# Patient Record
Sex: Male | Born: 1935 | Race: White | Hispanic: No | Marital: Married | Smoking: Never smoker
Health system: Southern US, Community
[De-identification: ages and names within clinical notes are randomized; demographics above are authoritative.]

## PROBLEM LIST (undated history)

## (undated) DIAGNOSIS — B0229 Other postherpetic nervous system involvement: Secondary | ICD-10-CM

## (undated) DIAGNOSIS — Z8619 Personal history of other infectious and parasitic diseases: Secondary | ICD-10-CM

## (undated) DIAGNOSIS — G510 Bell's palsy: Secondary | ICD-10-CM

## (undated) DIAGNOSIS — R251 Tremor, unspecified: Secondary | ICD-10-CM

## (undated) DIAGNOSIS — G4733 Obstructive sleep apnea (adult) (pediatric): Secondary | ICD-10-CM

## (undated) DIAGNOSIS — I350 Nonrheumatic aortic (valve) stenosis: Secondary | ICD-10-CM

## (undated) DIAGNOSIS — C801 Malignant (primary) neoplasm, unspecified: Secondary | ICD-10-CM

## (undated) DIAGNOSIS — J189 Pneumonia, unspecified organism: Secondary | ICD-10-CM

## (undated) DIAGNOSIS — R011 Cardiac murmur, unspecified: Secondary | ICD-10-CM

## (undated) DIAGNOSIS — E785 Hyperlipidemia, unspecified: Secondary | ICD-10-CM

## (undated) DIAGNOSIS — I10 Essential (primary) hypertension: Secondary | ICD-10-CM

## (undated) DIAGNOSIS — M1990 Unspecified osteoarthritis, unspecified site: Secondary | ICD-10-CM

## (undated) HISTORY — DX: Malignant (primary) neoplasm, unspecified: C80.1

## (undated) HISTORY — DX: Cardiac murmur, unspecified: R01.1

## (undated) HISTORY — DX: Obstructive sleep apnea (adult) (pediatric): G47.33

## (undated) HISTORY — DX: Hyperlipidemia, unspecified: E78.5

## (undated) HISTORY — PX: HERNIA REPAIR: SHX51

## (undated) HISTORY — DX: Bell's palsy: G51.0

## (undated) HISTORY — DX: Essential (primary) hypertension: I10

## (undated) HISTORY — PX: PROSTATE SURGERY: SHX751

## (undated) HISTORY — DX: Nonrheumatic aortic (valve) stenosis: I35.0

## (undated) HISTORY — PX: COLONOSCOPY: SHX174

## (undated) HISTORY — PX: TONSILLECTOMY: SUR1361

## (undated) HISTORY — DX: Tremor, unspecified: R25.1

---

## 2000-07-07 ENCOUNTER — Other Ambulatory Visit: Admission: RE | Admit: 2000-07-07 | Discharge: 2000-07-07 | Payer: Self-pay | Admitting: Urology

## 2000-07-07 ENCOUNTER — Encounter (INDEPENDENT_AMBULATORY_CARE_PROVIDER_SITE_OTHER): Payer: Self-pay

## 2000-08-12 ENCOUNTER — Encounter: Payer: Self-pay | Admitting: Urology

## 2000-08-18 ENCOUNTER — Inpatient Hospital Stay (HOSPITAL_COMMUNITY): Admission: RE | Admit: 2000-08-18 | Discharge: 2000-08-22 | Payer: Self-pay | Admitting: Urology

## 2000-08-18 ENCOUNTER — Encounter (INDEPENDENT_AMBULATORY_CARE_PROVIDER_SITE_OTHER): Payer: Self-pay | Admitting: Specialist

## 2004-07-07 ENCOUNTER — Emergency Department (HOSPITAL_COMMUNITY): Admission: EM | Admit: 2004-07-07 | Discharge: 2004-07-08 | Payer: Self-pay | Admitting: Emergency Medicine

## 2005-02-06 ENCOUNTER — Encounter (INDEPENDENT_AMBULATORY_CARE_PROVIDER_SITE_OTHER): Payer: Self-pay | Admitting: Specialist

## 2005-02-06 ENCOUNTER — Ambulatory Visit (HOSPITAL_COMMUNITY): Admission: RE | Admit: 2005-02-06 | Discharge: 2005-02-06 | Payer: Self-pay | Admitting: *Deleted

## 2006-09-27 ENCOUNTER — Ambulatory Visit: Payer: Self-pay | Admitting: Vascular Surgery

## 2006-11-05 ENCOUNTER — Ambulatory Visit: Payer: Self-pay | Admitting: Vascular Surgery

## 2006-12-06 ENCOUNTER — Ambulatory Visit: Payer: Self-pay | Admitting: Vascular Surgery

## 2006-12-28 ENCOUNTER — Ambulatory Visit: Payer: Self-pay | Admitting: Vascular Surgery

## 2007-01-03 ENCOUNTER — Ambulatory Visit: Payer: Self-pay | Admitting: Vascular Surgery

## 2007-01-18 ENCOUNTER — Ambulatory Visit: Payer: Self-pay | Admitting: Vascular Surgery

## 2007-01-25 ENCOUNTER — Ambulatory Visit: Payer: Self-pay | Admitting: Vascular Surgery

## 2007-04-29 ENCOUNTER — Ambulatory Visit: Payer: Self-pay | Admitting: Vascular Surgery

## 2007-05-25 ENCOUNTER — Ambulatory Visit: Payer: Self-pay | Admitting: Vascular Surgery

## 2007-06-15 ENCOUNTER — Ambulatory Visit: Payer: Self-pay | Admitting: Vascular Surgery

## 2007-07-20 ENCOUNTER — Ambulatory Visit: Payer: Self-pay | Admitting: Vascular Surgery

## 2007-07-28 ENCOUNTER — Ambulatory Visit: Payer: Self-pay | Admitting: Vascular Surgery

## 2007-08-25 ENCOUNTER — Ambulatory Visit: Payer: Self-pay | Admitting: Vascular Surgery

## 2007-11-03 ENCOUNTER — Ambulatory Visit: Payer: Self-pay | Admitting: Vascular Surgery

## 2010-11-11 NOTE — Assessment & Plan Note (Signed)
OFFICE VISIT   DAVINE, COBA  DOB:  Nov 02, 1931                                       04/29/2007  QIHKV#:[id number]   Patient presents today for continued followup of his bilateral lower  extremity venous hypertension.  He has undergone successful staged  bilateral laser ablation of the greater saphenous vein.  He reports this  is markedly improved the heavy sensation he has in his thighs .  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  He continues to report pain with standing associated with  this.  He reports this is progressive throughout the day and interferes  with his ability to do [facility] and exercise.   PHYSICAL EXAMINATION:  On physical exam, he has marked spider vein  telangiectasias over both thighs. He does have  areas of tributary  varicosities with some tenderness over calves and pretibial areas  bilaterally.  There is no evidence of superficial thrombophlebitis.   He has been incredibly compliant with 30-40 mmHg stockings, thigh high  for greater than 3 months.  He reports pain associated with the  tributary varicosities in calves and pretibial areas bilaterally. I  discussed the option of stab phlebectomy of his tributary varicosities  and expect complete relief of the pain associated with the tributaries.  He wishes to proceed as soon as we have assured his insurance coverage.   Larina Earthly, M.D.  Electronically Signed   TFE/MEDQ  D:  04/29/2007  T:  05/02/2007  Job:  [id number]

## 2010-11-11 NOTE — Assessment & Plan Note (Signed)
OFFICE VISIT   Stephen Nunez, Stephen Nunez  DOB:  27-Sep-1931                                       12/06/2006  [id number]#:[id number]   Here today for continued followup of his severe bilateral venous  hypertension.  He has had known incompetence of the saphenous vein and I  had recommended laser ablation, stab phlebectomy, and staged procedures  bilaterally.  His insurance carrier had suggested that he try 30-40 mm  compression.  He has actually been very compliant with this, despite the  difficulty in managing this high degree of compression.  He reports that  this does not afford him any improvement.  He has noted slightly less  bulging in the tributary varicosities, but has not had any relief in the  severe discomfort he has at the end of the day with prolonged standing.  I have recommended that we proceed with staged bilateral laser ablation  and stab phlebectomy.  He understands the procedure.  He feels that his  left leg is slightly worse than his right and wishes to proceed with his  left leg first.  We will proceed with this as soon as he has clearance  from his insurance carrier for coverage.   Larina Earthly, M.D.  Electronically Signed   TFE/MEDQ  D:  12/06/2006  T:  12/07/2006  Job:  [id number]   cc:   C. Duane Lope, M.D.

## 2010-11-11 NOTE — Assessment & Plan Note (Signed)
OFFICE VISIT   Stephen Nunez, Stephen Nunez  DOB:  August 28, 1931                                       01/03/2007  [id number]#:[id number]   The patient presents today for followup of his laser ablation of his  left greater saphenous vein on December 28, 2006. He did extremely well with  the procedure and has had minimal discomfort at the laser ablation site.  He has the usual amount of erythema over this area from the inflammatory  response from the ablation of the saphenous vein. He does have  decompression of the veins in his calf below this. He underwent hand  held duplex today and this reveals excellent result with occlusion of  his left great saphenous vein and patency of the common femoral vein at  the saphenous femoral junction.   I am quite pleased with his initial result as is the patient. He will  continue his ibuprofen p.r.n. I plan to see him again in 3 weeks for  staged right laser ablation.   Larina Earthly, M.D.  Electronically Signed   TFE/MEDQ  D:  01/03/2007  T:  01/03/2007  Job:  [id number]   cc:   C. Duane Lope, M.D.

## 2010-11-11 NOTE — Assessment & Plan Note (Signed)
OFFICE VISIT   Stephen Nunez, Stephen Nunez  DOB:  Mar 30, 1932                                       07/20/2007  ZOXWR#:[id number]   The patient presents today for final followup status post bilateral  saphenous vein ablation, bilateral stab phlebectomies.  He is quite  pleased with his overall results.  He has markedly diminished pain and  has markedly improved ability for prolonged standing.  He does have  multiple spider vein telangiectasia, but does not have any evidence of  recurrent varicose veins.  He will continue wearing his graduated  compression stockings, and will see again on an as needed basis.   Larina Earthly, M.D.  Electronically Signed   TFE/MEDQ  D:  07/20/2007  T:  07/21/2007  Job:  [id number]

## 2010-11-11 NOTE — Assessment & Plan Note (Signed)
OFFICE VISIT   Stephen Nunez, Stephen Nunez  DOB:  February 28, 1932                                       01/25/2007  GNFAO#:[id number]   Stephen Nunez underwent laser ablation of right greater saphenous vein by  Dr. Arbie Cookey on July 22 for symptomatic venous insufficiency of the right  leg.  He denies any discomfort in the right leg, tenderness, swelling or  otherwise.  He has been wearing elastic compression stockings and took  ibuprofen for seven days.  He also denies any distal edema.  His leg was  examined.  There is no evidence of any tenderness along the greater  saphenous vein, and no distal edema was noted.  Venous Duplex exam was  performed by me in the office today and reveals total occlusion of the  right greater saphenous vein from the saphenofemoral junction to the  distal thigh.  The vein is noncompressible, and the deep venous appears  normal with good flow.  He is reassured regarding these findings.  Return in three months for final followup with Dr. Arbie Cookey.   Quita Skye Hart [place], M.D.  Electronically Signed   JDL/MEDQ  D:  01/25/2007  T:  01/26/2007  Job:  [id number]

## 2010-11-14 NOTE — Op Note (Signed)
[name], Stephen Nunez               ACCOUNT NO.:  [id number]   MEDICAL RECORD NO.:  [id number]          PATIENT TYPE:  AMB   LOCATION:  DAY                          [facility]   PHYSICIAN:  Vikki Ports, MDDATE OF BIRTH:  1931-07-05   DATE OF PROCEDURE:  02/06/2005  DATE OF DISCHARGE:                                 OPERATIVE REPORT   PREOPERATIVE DIAGNOSIS:  Left inguinal hernia.   POSTOPERATIVE DIAGNOSIS:  Left inguinal hernia.   PROCEDURE:  Left inguinal hernia repair with mesh using the Prolene hernia  system.   ANESTHESIA:  General.   DESCRIPTION OF PROCEDURE:  The patient was taken to the operating room and  placed in a supine position and after adequate general anesthesia was  induced using laryngeal mask, the left groin was prepped and draped in the  normal sterile fashion. Using an oblique incision extending from the pubic  tubercle out towards the anterior-superior iliac spine and dissected down  through the subcutaneous tissue using Bovie electrocautery. The external  oblique fascia was identified and opened along its fibers down through the  external ring. The ilioinguinal nerve was identified and retracted  laterally. The spermatic cord was identified and the floor of Hesselbach's  triangle was inspected. There was no direct defect noted. The patient had a  rather large indirect hernia sac which was dissected off the spermatic cord,  twisted and ligated at the internal ring. It was excised and sent for  pathologic evaluation. The internal ring was then bluntly dissected using my  finger and a 4 x 4 sponge. The Prolene hernia system was then placed in the  preperitoneal space and the anterior layer was tacked to the pubic tubercle,  the inguinal ligament, transversalis fascia split and brought out lateral to  the cord and tacked laterally with running 2-0 Prolene suture. The external  oblique fascia was then closed with a running 3-0 Vicryl suture. The  skin  was closed with subcuticular 4-0 Monocryl, Steri-Strips and sterile  dressings were applied. The patient tolerated the procedure well and went to  PACU in good condition.      Vikki Ports, MD  Electronically Signed     KRH/MEDQ  D:  02/06/2005  T:  02/06/2005  Job:  [id number]

## 2010-11-14 NOTE — H&P (Signed)
[facility]  Patient:    Stephen Nunez, Stephen Nunez                      MRN: [id number] Adm. Date:  [id number] Attending:  Katherine Roan                         History and Physical  BRIEF HISTORY OF PRESENT ILLNESS:  This 77 year old white male is admitted with a clinical T2B, Gleason 3+4 adenocarcinoma of the prostate on the right side for radical surgery. He had an elevated PSA of 4.6 on November 2001 on routine testing. No previous history of UTI or irritated bladder symptoms. Biopsy showed 80% Gleason 3+4 cancer on the right side. He did have a hypoechoic area on this side as well. He was thoroughly counseled and decided on surgery for definitive treatment. He understands the risks, including but not limited to incontinence, impotence, deep vein thrombosis, bleeding, pulmonary emboli, and death. He auto donated 2 units of blood and underwent a mechanical bowel prep before surgery. His prostate is about 35 g in size.  CURRENT MEDICATIONS:  Pravachol, multivitamins; he has been off of his aspirin for over 1 week.  ALLERGIES:  None.  PAST SURGICAL HISTORY:  T&A as a child. Sinus surgery in 1982.  SOCIAL HISTORY:  He is a nonsmoker. He is retired from [employer] in [place] about a year ago. He is married but has no children.  FAMILY HISTORY:  His mother died at 39. His father died at 70 of a CVA. He has two brothers. No family history of cancer. One brother with diabetes and hypertension.  REVIEW OF SYSTEMS:  He has no cardiac or pulmonary symptomatology. No GI complaints. No arthritis or joint disease. NEUROPSYCHIATRIC REVIEW:  Negative.  PHYSICAL EXAMINATION:  VITAL SIGNS:  His temperature is 97.6, his pulse is 78, respirations 18, blood pressure 160/85, height 5 feet 11, weight 190 pounds.  GENERAL:  He is a white-haired, pleasant gentleman in no acute distress.  HEENT:  Clear.  NECK:  Supple. No bruits.  CHEST:   Clear to percussion and auscultation. No murmurs.  ABDOMEN:  Soft, without masses or organomegaly. Liver and spleen are nonpalpable.  GENITOURINARY:  Normal prostate except for a nodule on the right side. It is about 35 g, not fixed or indurated. His SV is not palpable. Scrotum is normal. Epididymides, testes are normal. Penis is without lesions. Meatus is adequate.  EXTREMITIES:  No edema. Good distal pulses. Intact sensation to light touch.  IMPRESSION:  T2B Gleason 3+4 right-sided adenocarcinoma of the prostate.  PLAN:  Radical retropubic prostatectomy and bilateral pelvic lymph node dissection as discussed. DD:  08/18/00 TD:  08/18/00 Job: [id number]

## 2010-11-14 NOTE — Op Note (Signed)
[facility]  Patient:    ZARON, ZWIEFELHOFER                      MRN: [id number] Proc. Date: 08/18/00 Adm. Date:  [id number] Attending:  Katherine Roan                           Operative Report  PREOPERATIVE DIAGNOSIS:  Clinical T2b Gleasons 3+4 right sided adenocarcinoma of the prostate.  POSTOPERATIVE DIAGNOSIS:  Clinical T2b Gleasons 3+4 right sided adenocarcinoma of the prostate, pending pathology report.  OPERATIVE PROCEDURE:  Radical retropubic prostatectomy, bilateral pelvic lymph node dissection.  SURGEON:  Rozanna Boer., M.D.  ASSISTANT:  Verl Dicker, M.D.  ANESTHESIA:  General endotracheal.  BRIEF HISTORY:  This 77 year old white male presented with an elevated PSA of 4.6 on a routine physical May 09, 2000. No history of urinary tract infection or irritative bladder symptoms. He had a biopsy which showed 80% positive for tumor on the right side with a Gleasons 3+4 cancer. He had no cancer on the left. He enters now understanding the risks including but not limited to incontinence, impotence, deep venous thrombosis, bleeding, pulmonary emboli and death. He autodonated two units of blood and did a mechanical bowel prep before surgery.  DESCRIPTION OF PROCEDURE:  The patient was placed on the operating room table in a supine position with a rolled towel underneath his sacrum slightly flexed. He was shaved, prepped and draped in the usual sterile fashion and a #24 Foley was inserted and the bladder drained.  A midline incision was made from the symphysis pubis to midway between the umbilicus down to the subcutaneous tissue to expose the rectus fascia. This was opened in the midline and the retroperitoneal space was entered. Using the Bookwalter retractor, the wound was packed open and the nodes on the patients right side were first addressed. They were taken in a packet from the external iliac down to  and including the obturator fossa back to the bifurcation of the iliac. The efferent lymphatics were either tied or clipped as they were encountered. The nodes in here felt soft and rubbery and not obviously positive. The endopelvic fascia was then incised with the Bovie on the right side. Then on the patients left side a similar node packet was taken from the external iliac down to and including the obturator fossa back to the bifurcation of the iliac, again, these nodes felt to be benign. The efferent lymphatics were either clipped or tied as they were encountered. The endopelvic fascia was then incised on this side. The puboprostatic ligaments were then cut sharply under direct vision and using a Hoen-Felter clamp, two passes were passed underneath the dorsal vein complex on top of the urethra and tied down with #1 Vicryl. A back tie with #0 Vicryl was then tied also on the dorsal vein complex and another tie on the bladder neck to prevent back bleeding. There was a little pesky bleeding on the bladder neck which had to be suture ligated with some 2-0 chromic as well. The dorsal vein complex was then incised with the Bovie exposing the urethra.  A right angle clamp was then passed underneath the urethra and elevated with an umbilical tape and the urethra was then cut sharply right at the apex. The Foley was then pulled into the operative field and pulled anteriorly to expose the ventral side of the urethra  which was then cut under direct vision. Then the plane underneath the prostate was then sharply and bluntly dissected and the prostate was peeled off the rectum. On the patients right side we did not take any attempt to save the nerves but went as wide as possible in order to contain all the tumor. This was taken back to the level of the seminal vesicle on the right side. On the left side we did try to preserve the neurovascular bundle and as the pedicle was taken close to the prostate  on the left side. This was taken back to the seminal vesicle as well. In the midline the vas deferens were identified, clipped bilaterally and the seminal vesicles were dissected free of their tips and then clamped and divided.  The bladder neck was then dissected carefully with a Vanderbilt clamp and the Bovie down to the urethra but the tissue was quite thin here and a little opening into the bladder anteriorly was made; this was closed with a chromic catgut, but as the urethra was dissected off the prostate, it again tore and had to be cut under direct vision. The Foley was then removed from the bladder and the prostate then dissected off the bladder itself. This left a bladder neck that had to be reconstructed somewhat. The orifices were fairly well away from the opening and inferiorly at 6 oclock a 2-0 chromic catgut suture was used to close the opening somewhat. The urethra was then sutured back to the bladder with 3-0 chromic catgut sutures circumferentially leaving a nice bladder neck that would just admit my small finger. Next, the bleeding was controlled in the pelvis. There was a few stitches that had to be done, but after this was done, a Greenwald sound was passed per urethra and sutures were placed at 5, 7, 2, 9 and 12 oclock with 2-0 Vicryl suture. A #20 Foley catheter was then inserted and the balloon inflated to 10 cc and passed into the bladder. The stitches on the urethra were then placed on the corresponding area of the bladder neck and when we went to tie these down, the Foley pulled through the bladder neck where the previous stitch had been passed. Another 2-0 chromic catgut suture was then used to carefully close the bladder neck underneath the stitches and the Foley was placed back into the bladder with the balloon inflated to 25 cc. Then with gentle traction on the Foley, the sutures were tied down on the bladder neck. This afforded a seemingly water-tight  anastomosis with just very slight leakage anteriorly. The bladder irrigated free; there were just a few small clots but these were cleared out. A Jackson-Pratt drain was brought through a separate stab incision on the  left, brought into the retroperitoneal space and secured to the skin with a nylon suture.  The rectus fascia was closed with a running #1 PDS and the skin closed with clips. Sterile, dry dressings were applied and the Foley was attached to straight drainage. Estimated blood loss was 1200 cc, he did not receive transfusions during the operation, and remained stable. Sponge, needle, lap and instrument counts were correct on two occasions. The patient was then taken to the recovery room in good condition and later admitted for postoperative care.DD:  08/18/00 TD:  08/19/00 Job: [id number]

## 2010-11-14 NOTE — Discharge Summary (Signed)
[facility]  Patient:    Stephen Nunez, Stephen Nunez                      MRN: [id number] Adm. Date:  [id number] Disc. Date: [id number] Attending:  Katherine Roan                           Discharge Summary  DISCHARGE DIAGNOSES: 1. T3a, N0, Gleason 4+3 adenocarcinoma of the prostate. 2. Elevated prostate-specific antigen.  OPERATIONS:  Radical retropubic prostatectomy and bilateral pelvic lymph node dissection on August 18, 2000.  HISTORY OF PRESENT ILLNESS:  This 77 year old white male was admitted with a clinical T2b, Gleason 3+4 adenocarcinoma of the prostate on the right side, for radical surgery.  He had an elevated PSA of 4.6 in November 2001, on routine testing.  No previous history of UTI or irritative bladder symptoms. Biopsy showed 80% of the right side had Gleason 3+4.  He did have a hypoechoic area on this side, but no evident extension of disease that could be determined preoperatively.  He was thoroughly counseled and decided on surgery for definitive treatment.  He understands the risks and limitations of surgery including incontinence, impotence, deep vein thrombosis, bleeding, pulmonary embolus, and death.  He donated two units of blood and underwent a mechanical bowel prep before surgery.  MEDICATIONS:  Pravachol, multivitamins, and he had been off aspirin for over a week prior to surgery.  LABORATORY DATA:  His preoperative evaluation was pretty normal with hematocrit of 38%.  His creatinine was 1.2, BUN 20, electrolytes normal.  HOSPITAL COURSE:  He was taken to the operating room on the day of admission where he underwent radical retropubic prostatectomy.  He did well postsurgery, did not receive transfusions until the postoperative period.  He did get two units of autologous blood because his hematocrit was 26%, and there was a little bit of difficulty controlling his blood pressure.  After the two units on the first  postoperative day his hematocrit remained unchanged at 26%, but his vital signs were stable at that time.  He started ambulation and a clear liquid diet.  On the second postoperative day his hematocrit was 24%.  His JP drainage gradually decreased, and JP was removed on the fourth postoperative day prior to discharge.  He was on a regular diet by that time and ambulatory and fully ready to go home.  His pathology showed that the tumor was a little bit more extensive.  It was focally through the capsule on the right side. Surgical margins, seminal vesicles were clear.  There was some foci of tumor involving both lobes of the prostate, particularly the right lobe, with focal involvement of the left lobe apex.  He was stage T3a, N0, and the Gleason score had been upgraded to 4+3.  He was to return to the office in four days to have his sutures removed.  Sent home in improved and ambulatory condition, with his Foley catheter, on a regular diet, with pain medicine and antibiotics. DD:  09/02/00 TD:  09/02/00 Job: [id number]

## 2011-02-03 ENCOUNTER — Other Ambulatory Visit (HOSPITAL_COMMUNITY): Payer: Self-pay | Admitting: *Deleted

## 2011-02-03 DIAGNOSIS — G35 Multiple sclerosis: Secondary | ICD-10-CM

## 2011-02-05 ENCOUNTER — Ambulatory Visit (HOSPITAL_COMMUNITY)
Admission: RE | Admit: 2011-02-05 | Discharge: 2011-02-05 | Disposition: A | Discharge status: Home / Self Care | Payer: Medicare Other | Source: Ambulatory Visit | Attending: Neurology | Admitting: Neurology

## 2011-02-05 DIAGNOSIS — G35 Multiple sclerosis: Secondary | ICD-10-CM

## 2011-02-05 DIAGNOSIS — G319 Degenerative disease of nervous system, unspecified: Secondary | ICD-10-CM | POA: Insufficient documentation

## 2011-02-05 DIAGNOSIS — Z8673 Personal history of transient ischemic attack (TIA), and cerebral infarction without residual deficits: Secondary | ICD-10-CM | POA: Insufficient documentation

## 2011-02-05 DIAGNOSIS — M549 Dorsalgia, unspecified: Secondary | ICD-10-CM | POA: Insufficient documentation

## 2011-02-05 DIAGNOSIS — R0789 Other chest pain: Secondary | ICD-10-CM | POA: Insufficient documentation

## 2012-02-10 ENCOUNTER — Other Ambulatory Visit: Payer: Self-pay | Admitting: Gastroenterology

## 2013-07-11 ENCOUNTER — Encounter: Payer: Self-pay | Admitting: Internal Medicine

## 2013-07-20 ENCOUNTER — Ambulatory Visit (INDEPENDENT_AMBULATORY_CARE_PROVIDER_SITE_OTHER): Payer: Medicare Other | Admitting: Internal Medicine

## 2013-07-20 ENCOUNTER — Encounter: Payer: Self-pay | Admitting: Internal Medicine

## 2013-07-20 VITALS — BP 140/70 | HR 69 | Ht 70.0 in | Wt 176.0 lb

## 2013-07-20 DIAGNOSIS — R9431 Abnormal electrocardiogram [ECG] [EKG]: Secondary | ICD-10-CM

## 2013-07-20 DIAGNOSIS — R011 Cardiac murmur, unspecified: Secondary | ICD-10-CM

## 2013-07-20 DIAGNOSIS — I447 Left bundle-branch block, unspecified: Secondary | ICD-10-CM

## 2013-07-20 NOTE — Patient Instructions (Signed)
Your physician has requested that you have an echocardiogram. Echocardiography is a painless test that uses sound waves to create images of your heart. It provides your doctor with information about the size and shape of your heart and how well your heart's chambers and valves are working. This procedure takes approximately one hour. There are no restrictions for this procedure.  Your physician has requested that you have a lexiscan myoview. For further information please visit [email or web address]. Please follow instruction sheet, as given.  Your physician recommends that you schedule a follow-up appointment in: 1 year.  Dr. Harrington Challenger will follow up after your tests are completed.  Continue taking same medications as listed.

## 2013-07-20 NOTE — Progress Notes (Signed)
HPI  Patient is a  80 yo who is referred by Dr Melinda Crutch for evaluation of murmur  She has a histroy of hyperlipidemia, hypertension. Breathign is OK  No CP No Dizziness Walks     Not on File  Current Outpatient Prescriptions  Medication Sig Dispense Refill  . pravastatin (PRAVACHOL) 40 MG tablet Take 40 mg by mouth daily.      . quinapril (ACCUPRIL) 20 MG tablet Take 20 mg by mouth daily.       No current facility-administered medications for this visit.    No past medical history on file.  No past surgical history on file.  No family history on file.  History   Social History  . Marital Status: Divorced    Spouse Name: N/A    Number of Children: N/A  . Years of Education: N/A   Occupational History  . Not on file.   Social History Main Topics  . Smoking status: Never Smoker   . Smokeless tobacco: Not on file  . Alcohol Use: Not on file  . Drug Use: Not on file  . Sexual Activity: Not on file   Other Topics Concern  . Not on file   Social History Narrative  . No narrative on file    Review of Systems:  All systems reviewed.  They are negative to the above problem except as previously stated.  Vital Signs: BP 140/70  Pulse 69  Ht 5\' 10"  (1.778 m)  Wt 176 lb (79.833 kg)  BMI 25.25 kg/m2  Physical Exam Patinet is in NAD HEENT:  Normocephalic, atraumatic. EOMI, PERRLA.  Neck: JVP is normal.  Radiating murmur vs bruit.  Lungs: clear to auscultation. No rales no wheezes.  Heart: Regular rate and rhythm. Normal S1, S2. No S3.   Gr III/Vi systolic murmur LSB to base. PMI not displaced.  Abdomen:  Supple, nontender. Normal bowel sounds. No masses. No hepatomegaly.  Extremities:   Good distal pulses throughout. No lower extremity edema.  Musculoskeletal :moving all extremities.  Neuro:   alert and oriented x3.  CN II-XII grossly intact.  EKG  69 SR  LBBB Assessment and Plan:  1.  Murmur  Will schedule echo to evaluate  2.  LBBB  Get old EKG  Set up  for lexiscan myoview

## 2013-08-07 ENCOUNTER — Ambulatory Visit (HOSPITAL_BASED_OUTPATIENT_CLINIC_OR_DEPARTMENT_OTHER): Payer: Medicare Other | Admitting: Cardiology

## 2013-08-07 ENCOUNTER — Ambulatory Visit (HOSPITAL_COMMUNITY): Payer: Medicare Other | Attending: Cardiology | Admitting: Radiology

## 2013-08-07 ENCOUNTER — Encounter: Payer: Self-pay | Admitting: Cardiology

## 2013-08-07 VITALS — BP 166/84 | Ht 70.0 in | Wt 174.0 lb

## 2013-08-07 DIAGNOSIS — R011 Cardiac murmur, unspecified: Secondary | ICD-10-CM | POA: Insufficient documentation

## 2013-08-07 DIAGNOSIS — I10 Essential (primary) hypertension: Secondary | ICD-10-CM | POA: Insufficient documentation

## 2013-08-07 DIAGNOSIS — Z8249 Family history of ischemic heart disease and other diseases of the circulatory system: Secondary | ICD-10-CM | POA: Insufficient documentation

## 2013-08-07 DIAGNOSIS — R9431 Abnormal electrocardiogram [ECG] [EKG]: Secondary | ICD-10-CM | POA: Insufficient documentation

## 2013-08-07 DIAGNOSIS — I379 Nonrheumatic pulmonary valve disorder, unspecified: Secondary | ICD-10-CM | POA: Insufficient documentation

## 2013-08-07 DIAGNOSIS — I447 Left bundle-branch block, unspecified: Secondary | ICD-10-CM

## 2013-08-07 DIAGNOSIS — I080 Rheumatic disorders of both mitral and aortic valves: Secondary | ICD-10-CM | POA: Insufficient documentation

## 2013-08-07 DIAGNOSIS — I079 Rheumatic tricuspid valve disease, unspecified: Secondary | ICD-10-CM | POA: Insufficient documentation

## 2013-08-07 DIAGNOSIS — R002 Palpitations: Secondary | ICD-10-CM | POA: Insufficient documentation

## 2013-08-07 MED ORDER — ADENOSINE (DIAGNOSTIC) 3 MG/ML IV SOLN
0.5600 mg/kg | Freq: Once | INTRAVENOUS | Status: AC
  Administered 2013-08-07: 44.1 mg via INTRAVENOUS

## 2013-08-07 MED ORDER — TECHNETIUM TC 99M SESTAMIBI GENERIC - CARDIOLITE
10.0000 | Freq: Once | INTRAVENOUS | Status: AC | PRN
  Administered 2013-08-07: 10 via INTRAVENOUS

## 2013-08-07 MED ORDER — TECHNETIUM TC 99M SESTAMIBI GENERIC - CARDIOLITE
30.0000 | Freq: Once | INTRAVENOUS | Status: AC | PRN
  Administered 2013-08-07: 30 via INTRAVENOUS

## 2013-08-07 NOTE — Progress Notes (Signed)
Echo performed. 

## 2013-08-07 NOTE — Progress Notes (Signed)
[facility] [address] [place], [place] [address] [phone]    Cardiology Nuclear Med Study  Stephen Nunez is a 80 y.o. male     MRN : [id number]     DOB: 1931/09/02  Procedure Date: 08/07/2013  Nuclear Med Background Indication for Stress Test:  Evaluation for Ischemia and Abnormal EKG History:  LBBB Cardiac Risk Factors: Family History - CAD, Hypertension, LBBB and Lipids  Symptoms:  Palpitations   Nuclear Pre-Procedure Caffeine/Decaff Intake:  None > 12 hrs NPO After: 7:00pm   Lungs:  clear O2 Sat: 98% on room air. IV 0.9% NS with Angio Cath:  22g  IV Site: R Wrist , tolerated well IV Started by:  Irven Baltimore, RN  Chest Size (in):  38 Cup Size: n/a  Height: 5\' 10"  (1.778 m)  Weight:  174 lb (78.926 kg)  BMI:  Body mass index is 24.97 kg/(m^2). Tech Comments:  Took Accupril this am    Nuclear Med Study 1 or 2 day study: 1 day  Stress Test Type:  Adenosine  Reading MD: N/A  Order Authorizing Provider:  Dorris Carnes, MD  Resting Radionuclide: Technetium 77m Sestamibi  Resting Radionuclide Dose: 11.0 mCi   Stress Radionuclide:  Technetium 70m Sestamibi  Stress Radionuclide Dose: 33.0 mCi           Stress Protocol Rest HR: 64 Stress HR: 82  Rest BP: 166/86 Stress BP: 160  Exercise Time (min): n/a METS: n/a   Predicted Max HR: 141 bpm % Max HR: 58.16 bpm Rate Pressure Product: 13612   Dose of Adenosine (mg):  44.3 Dose of Lexiscan: n/a mg  Dose of Atropine (mg): n/a Dose of Dobutamine: n/a mcg/kg/min (at max HR)  Stress Test Technologist: Perrin Maltese, EMT-P  Nuclear Technologist:  Charlton Amor, CNMT     Rest Procedure:  Myocardial perfusion imaging was performed at rest 45 minutes following the intravenous administration of Technetium 68m Sestamibi. Rest ECG: NSR-LBBB  Stress Procedure:  The patient received IV adenosine at 140 mcg/kg/min for 4 minutes.  This patient just felt warm with the Adenosine infusion.Technetium  53m Sestamibi was injected at the 2 minute mark and quantitative spect images were obtained after a 45 minute delay. Stress ECG: No significant change from baseline ECG  QPS Raw Data Images:  Normal; no motion artifact; normal heart/lung ratio. Stress Images:  Normal homogeneous uptake in all areas of the myocardium. Rest Images:  Normal homogeneous uptake in all areas of the myocardium. Subtraction (SDS):  No evidence of ischemia. Transient Ischemic Dilatation (Normal <1.22):  1.05 Lung/Heart Ratio (Normal <0.45):  0.35  Quantitative Gated Spect Images QGS EDV:  106 ml QGS ESV:  44 ml  Impression Exercise Capacity:  Lexiscan with no exercise. BP Response:  Normal blood pressure response. Clinical Symptoms:  No significant symptoms noted. ECG Impression:  Baseline:  LBBB.  EKG uninterpretable due to LBBB at rest and stress. Comparison with Prior Nuclear Study: No previous nuclear study performed  Overall Impression:  Normal stress nuclear study.  LV Ejection Fraction: 59%.  LV Wall Motion:  NL LV Function; NL Wall Motion   [employer]

## 2013-08-09 ENCOUNTER — Telehealth: Payer: Self-pay | Admitting: Internal Medicine

## 2013-08-09 NOTE — Telephone Encounter (Signed)
Spoke with pt, aware of stress test results. 

## 2013-08-09 NOTE — Telephone Encounter (Signed)
New message     Want stress test results--want to go out of town and want to make sure it is ok.  OK to leave a msg on his ans machine

## 2013-09-01 ENCOUNTER — Ambulatory Visit: Payer: Medicare Other | Admitting: Neurology

## 2013-09-21 ENCOUNTER — Ambulatory Visit (INDEPENDENT_AMBULATORY_CARE_PROVIDER_SITE_OTHER): Payer: Medicare Other | Admitting: Neurology

## 2013-09-21 ENCOUNTER — Encounter: Payer: Self-pay | Admitting: Neurology

## 2013-09-21 VITALS — BP 140/70 | HR 63 | Ht 67.72 in | Wt 175.3 lb

## 2013-09-21 DIAGNOSIS — B0229 Other postherpetic nervous system involvement: Secondary | ICD-10-CM | POA: Insufficient documentation

## 2013-09-21 MED ORDER — LIDOCAINE 5 % EX OINT: 1.0000 "application " | TOPICAL_OINTMENT | Freq: Two times a day (BID) | CUTANEOUS | Status: DC | PRN

## 2013-09-21 MED ORDER — NORTRIPTYLINE HCL 10 MG PO CAPS: ORAL_CAPSULE | ORAL | Status: DC

## 2013-09-21 NOTE — Patient Instructions (Addendum)
1.  Take medicine as follows:    PM Week 1  Nortriptyline 10mg  Week 2 Nortriptyline 20mg  Week 3 Nortriptyline 30mg  Week 4 Nortriptyline 40mg    Week 5 Nortriptyline 50mg   2.  Call in 4 weeks to let me know how you are doing and so I can prescribe you 50mg  tablets  3.  Start using lidocaine ointment to left chest wall and back.  Wear gloves when applying this.  4.  Return to clinic 90-month

## 2013-09-21 NOTE — Progress Notes (Signed)
[facility] Note - Initial Visit   Date: 09/21/2013    Stephen Nunez MRN: [id number] DOB: Nov 19, 1931   Dear Dr Harrington Challenger:  Thank you for your kind referral of Stephen Nunez for consultation of post-herpetic neuralgia. Although his history is well known to you, please allow [place] to reiterate it for the purpose of our medical record. The patient was accompanied to the clinic by wife who also provides collateral information.     History of Present Illness: Stephen Nunez is a 80 y.o. right-handed Caucasian male with history of shingles involving the left chest and back (2000), hyperlipidemia, hypertension, and prostate cancer s/p resection (2004) presenting for evaluation of persistent pain from shingles.    He was diagnosed with shingles involving the left chest wall (T3 - T8) and since developed post herpetic neuralgia which is severe.  Pain is constant and described burning pain.  It is worse with light pressure, over exertion, and even a cool breeze will exacerbate symptoms.  In fact, he cannot even wear clothing over the left side because it triggers symptoms, so his wife cuts a hole in his shirts to avoid pressure.  He splashes rubbing alcohol on his chest which helps.  He takes 2-4 tylenol per day and tramadol (only when going out).   He has seen 4 neurologists, pain management, and accupuncture specialist in the past.  He has previously taking Lyrica, neurontin (~600mg  BID), lidocaine patches, and oxycodone.  He tried epidural injections x 3 without benefit.  He has also been in three drug trials without improvement.  Out-side paper records, electronic medical record, and images have been reviewed where available and summarized as:  MRI brain 02/03/2011: 1. No acute intracranial abnormality.  2. Atrophy and diffuse white matter change. While this could represent a demyelinating process, the overall picture is more  compatible with chronic microvascular  ischemic change. The  3. Remote lacunar infarcts throughout the basal ganglia bilaterally support a chronic ischemic process.  4. Postoperative changes of the right nasal cavity and maxillary sinus.   Past Medical History  Diagnosis Date  . Hyperlipidemia   . Hypertension   . Cancer     Past Surgical History  Procedure Laterality Date  . Prostate surgery    . Hernia repair       Medications:  Current Outpatient Prescriptions on File Prior to Visit  Medication Sig Dispense Refill  . aspirin 325 MG tablet Take 325 mg by mouth daily.      . Coenzyme Q10 150 MG CAPS Take by mouth.      . cyanocobalamin 500 MCG tablet Take 500 mcg by mouth daily.      . folic acid (FOLVITE) 409 MCG tablet Take 400 mcg by mouth daily.      . Multiple Vitamin (MULTIVITAMIN) tablet Take 1 tablet by mouth daily.      . Omega-3 Fatty Acids (FISH OIL) 1200 MG CAPS Take by mouth.      Marland Kitchen OVER THE COUNTER MEDICATION Take 90 mg by mouth daily. POTASSIUM      . pravastatin (PRAVACHOL) 40 MG tablet Take 40 mg by mouth daily.      . quinapril (ACCUPRIL) 20 MG tablet Take 20 mg by mouth daily.       No current facility-administered medications on file prior to visit.    Allergies: No Known Allergies  Family History: Family History  Problem Relation Age of Onset  . Heart attack Father  Social History: History   Social History  . Marital Status: Divorced    Spouse Name: N/A    Number of Children: N/A  . Years of Education: N/A   Occupational History  . retired    Social History Main Topics  . Smoking status: Never Smoker   . Smokeless tobacco: Not on file  . Alcohol Use: Not on file  . Drug Use: Not on file  . Sexual Activity: Not on file   Other Topics Concern  . Not on file   Social History Narrative  . No narrative on file    Review of Systems:  CONSTITUTIONAL: No fevers, chills, night sweats, or weight loss.   EYES: No visual changes or eye pain ENT: No hearing changes.  No  history of nose bleeds.   RESPIRATORY: No cough, wheezing and shortness of breath.   CARDIOVASCULAR: Negative for chest pain, and palpitations.   GI: Negative for abdominal discomfort, blood in stools or black stools.  No recent change in bowel habits.   GU:  No history of incontinence.   MUSCLOSKELETAL: No history of joint pain or swelling.  No myalgias.   SKIN: Negative for lesions, rash, and itching.   HEMATOLOGY/ONCOLOGY: Negative for prolonged bleeding, bruising easily, and swollen nodes. ENDOCRINE: Negative for cold or heat intolerance, polydipsia or goiter.   PSYCH:  No depression or anxiety symptoms.   NEURO: As Above.   Vital Signs:  BP 140/70  Pulse 63  Ht 5' 7.72" (1.72 m)  Wt 175 lb 5 oz (79.521 kg)  BMI 26.88 kg/m2  SpO2 97% Pain Scale: 0 on a scale of 0-10   General Medical Exam:   General:  Well appearing, comfortable Eyes/ENT: see cranial nerve examination.    Back:  No pain to palpation of spinous processes.   Extremities:  No deformities, edema, or skin discoloration. Good capillary refill.   Skin:  Dry skin, no evidence of scarring from shingles  Neurological Exam: MENTAL STATUS including orientation to time, place, person, recent and remote memory, attention span and concentration, language, and fund of knowledge is normal.  Speech is not dysarthric.  CRANIAL NERVES: II:  No visual field defects.  Limited fundoscopic exam   III-IV-VI: Pupils equal round and reactive to light.  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  No ptosis.   V:  Normal facial sensation.   VII:  Normal facial symmetry and movements. VIII:  Normal hearing and vestibular function.   IX-X:  Normal palatal movement.   XI:  Normal shoulder shrug and head rotation.   XII:  Normal tongue strength and range of motion, no deviation or fasciculation.  MOTOR:  No atrophy, fasciculations or abnormal movements.  No pronator  drift.  Tone is normal.    Right Upper Extremity:    Left Upper Extremity:    Deltoid  5/5   Deltoid  5/5   Biceps  5/5   Biceps  5/5   Triceps  5/5   Triceps  5/5   Wrist extensors  5/5   Wrist extensors  5/5   Wrist flexors  5/5   Wrist flexors  5/5   Finger extensors  5/5   Finger extensors  5/5   Finger flexors  5/5   Finger flexors  5/5   Dorsal interossei  5/5   Dorsal interossei  5/5   Abductor pollicis  5/5   Abductor pollicis  5/5   Tone (Ashworth  scale)  0  Tone (Ashworth scale)  0   Right Lower Extremity:    Left Lower Extremity:    Hip flexors  5/5   Hip flexors  5/5   Hip extensors  5/5   Hip extensors  5/5   Knee flexors  5/5   Knee flexors  5/5   Knee extensors  5/5   Knee extensors  5/5   Dorsiflexors  5/5   Dorsiflexors  5/5   Plantarflexors  5/5   Plantarflexors  5/5   Toe extensors  5/5   Toe extensors  5/5   Toe flexors  5/5   Toe flexors  5/5   Tone (Ashworth scale)  0  Tone (Ashworth scale)  0   MSRs:  Right                                                                 Left brachioradialis 2+  brachioradialis 2+  biceps 2+  biceps 2+  triceps 2+  triceps 2+  patellar 0  patellar 0  ankle jerk 0  ankle jerk 0  Hoffman no  Hoffman no  plantar response down  plantar response down   SENSORY:  Exquisite hyperesthesia to pin prick over the left T4-T8 dermatomes.  Otherwise, normal and symmetric perception of light touch, pinprick, vibration, and proprioception.  Romberg's sign absent.   COORDINATION/GAIT: Normal finger-to- nose-finger and heel-to-shin.  Intact rapid alternating movements bilaterally.  Able to rise from a chair without using arms.  Stooped posture, gait appears stable.   IMPRESSION: Mr. Vanosdol is a delightful 80 year-old gentleman presenting for evaluation of post-herpetic neuralgia since 2000.  Symptoms are clearly severe and functionally interfering with his daily activities.  It is unfortunate that he has been dealing with this pain for  such a long time without adequate pain relief, despite trying Lyrica, gabapentin, lidocaine patch, nerve blocks, and drug trials.  I explained that given the severity of his pain, it would be challenging and may involve a series medication trials until we find something that works, but fortunately, there are a number of medications which have not been given an adequate trial at the optimal dose, including Lyrica and gabapentin.  For now, I will start him on nortriptyline and offered him lidocaine ointment to apply.     PLAN/RECOMMENDATIONS:  1.  Start taking nortriptyline 10mg  qhs and titirate by 10mg /week to goal of 50mg  daily 2.  Start lidocaine ointment - apply to chest wall and back  3.  Patient to call in 4 weeks with update.  If he is tolerating, will start him on nortriptyline 50mg  tablets 4.  Return to clinic in 53-months or sooner as needed   The duration of this appointment visit was 50 minutes of face-to-face time with the patient.  Greater than 50% of this time was spent in counseling, explanation of diagnosis, planning of further management, and coordination of care.   Thank you for allowing me to participate in patient's care.  If I can answer any additional questions, I would be pleased to do so.    Sincerely,    Leeasia Secrist K. Posey Pronto, DO

## 2013-10-16 ENCOUNTER — Telehealth: Payer: Self-pay | Admitting: Neurology

## 2013-10-16 NOTE — Telephone Encounter (Signed)
Called patient back and he is taking 40 mg qd.  He will need another rx called in.  How many refills?  He comes back to see you on June 1.

## 2013-10-16 NOTE — Telephone Encounter (Signed)
Nortriptyline 50mg  tab take one tablet at bedtime, #30, 3 refills.  Jenniffer Vessels K. Posey Pronto, DO

## 2013-10-16 NOTE — Telephone Encounter (Signed)
I called patient back and he said that nothing has happened since starting this medication.  He says that he will be taking his last pill on Wednesday.  Any suggestions?

## 2013-10-16 NOTE — Telephone Encounter (Signed)
Did he increase the dose to nortriptyline 50mg  daily?  He should be taking 50mg  daily now and should allow 2-3 weeks for the medication to have effect at that dose.   Danaysha Kirn K. Posey Pronto, DO

## 2013-10-16 NOTE — Telephone Encounter (Signed)
Pt called and gave me his cell phone number for you guys to call if home number is not working [phone]

## 2013-10-16 NOTE — Telephone Encounter (Signed)
Pt called wanting to give an update on the NORTRIPTYLINE he has been taking for 4 weeks now. Update: Pt states that the medication is not working for him. He can not tell a difference.

## 2013-10-16 NOTE — Telephone Encounter (Signed)
Rx called in to [place].

## 2013-11-10 ENCOUNTER — Other Ambulatory Visit: Payer: Self-pay | Admitting: *Deleted

## 2013-11-10 ENCOUNTER — Telehealth: Payer: Self-pay | Admitting: *Deleted

## 2013-11-10 ENCOUNTER — Telehealth: Payer: Self-pay | Admitting: Neurology

## 2013-11-10 MED ORDER — NORTRIPTYLINE HCL 25 MG PO CAPS: 25.0000 mg | ORAL_CAPSULE | Freq: Every day | ORAL | Status: DC

## 2013-11-10 NOTE — Telephone Encounter (Signed)
Called patient back and got answering machine.  Patient asked me to leave a message if he did not answer.  Left a message giving him instructions per Dr. Posey Pronto.

## 2013-11-10 NOTE — Telephone Encounter (Signed)
Tell him to go down to 25mg  daily x one week, then we can stop it.  Deveion Denz K. Posey Pronto, DO

## 2013-11-10 NOTE — Telephone Encounter (Signed)
Called patient back and he was requesting some 25 mg capsules to be called in.  #7 capsules sent to pharmacy.

## 2013-11-10 NOTE — Telephone Encounter (Signed)
Patient has been taking Nortriptyline 50 mg qd for about 3 weeks now.  He says it is not helping and is making him sluggish.  Can he stop taking it?

## 2013-11-10 NOTE — Telephone Encounter (Signed)
Pt is returning your call please call back

## 2013-11-21 ENCOUNTER — Ambulatory Visit: Payer: Medicare Other | Admitting: Neurology

## 2013-11-27 ENCOUNTER — Encounter: Payer: Self-pay | Admitting: Neurology

## 2013-11-27 ENCOUNTER — Ambulatory Visit (INDEPENDENT_AMBULATORY_CARE_PROVIDER_SITE_OTHER): Payer: Medicare Other | Admitting: Neurology

## 2013-11-27 VITALS — BP 124/78 | HR 69 | Ht 67.72 in | Wt 168.3 lb

## 2013-11-27 DIAGNOSIS — B0229 Other postherpetic nervous system involvement: Secondary | ICD-10-CM

## 2013-11-27 MED ORDER — GABAPENTIN 300 MG PO CAPS: 300.0000 mg | ORAL_CAPSULE | Freq: Three times a day (TID) | ORAL | Status: DC

## 2013-11-27 NOTE — Patient Instructions (Addendum)
1. Take Neurontin 300mg  tablets as follows:   Morning       Afternoon        Evening                                     1 tab             Day 1-5  1 tab                     1 tab             Day 6-10  1 tab          1 tab            1 tab                Day 11-15  1 tab             1 tab            2 tab                Day 16-20   2 tab              1 tab               2 tab                Day 21-25  2 tab  2 tab  2 tab  Continue  2.  Try vick vaporub to affected area 3.  Please call me in 30-month to let me know how you are doing 4.  Return to clinic in 11-months

## 2013-11-27 NOTE — Progress Notes (Signed)
Follow-up Visit   Date: 11/27/2013    Stephen Nunez MRN: [id number] DOB: 05/22/1932   Interim History: Stephen Nunez is a 80 y.o. right-handed Caucasian male with history of history of shingles involving the left chest and back (2000), hyperlipidemia, hypertension, and prostate cancer s/p resection (2004) returning to the clinic for follow-up of post-herpetic neuralgia.  The patient was accompanied to the clinic by wife who also provides collateral information.    History of present illness: He was diagnosed with shingles involving the left chest wall (T3 - T8) and since developed post herpetic neuralgia which is severe. Pain is constant and described burning pain. It is worse with light pressure, over exertion, and even a cool breeze will exacerbate symptoms. In fact, he cannot even wear clothing over the left side because it triggers symptoms, so his wife cuts a hole in his shirts to avoid pressure. He splashes rubbing alcohol on his chest which helps. He takes 2-4 tylenol per day and tramadol (only when going out).  He has seen 4 neurologists, pain management, and accupuncture specialist in the past. He has previously taking Lyrica, neurontin (~600mg  BID), lidocaine patches, and oxycodone. He tried epidural injections x 3 without benefit. He has also been in three drug trials without improvement.   - Follow-up 11/27/2013:  He was tried on nortriptyline but it was stopped due to cognitive effects at 50mg  daily and lidocaine ointment without any benefit.  Otherwise, he is stable.  He is currently taking tylenol 325mg  (two tablets) and ultram only when severe.  No new complaints, pain remains unchanged.     Medications:  Current Outpatient Prescriptions on File Prior to Visit  Medication Sig Dispense Refill  . aspirin 325 MG tablet Take 325 mg by mouth daily.      . Coenzyme Q10 150 MG CAPS Take by mouth.      . cyanocobalamin 500 MCG tablet Take 500 mcg by mouth daily.      . folic  acid (FOLVITE) 440 MCG tablet Take 400 mcg by mouth daily.      Marland Kitchen lidocaine (XYLOCAINE) 5 % ointment Apply 1 application topically 2 (two) times daily as needed.  35.44 g  3  . Multiple Vitamin (MULTIVITAMIN) tablet Take 1 tablet by mouth daily.      . Omega-3 Fatty Acids (FISH OIL) 1200 MG CAPS Take by mouth.      Marland Kitchen OVER THE COUNTER MEDICATION Take 90 mg by mouth daily. POTASSIUM      . pravastatin (PRAVACHOL) 40 MG tablet Take 40 mg by mouth daily.      . quinapril (ACCUPRIL) 20 MG tablet Take 20 mg by mouth daily.      Marland Kitchen UNABLE TO FIND Med Name: muscadine grape pills       No current facility-administered medications on file prior to visit.    Allergies: No Known Allergies   Review of Systems:  CONSTITUTIONAL: No fevers, chills, night sweats, or weight loss.   EYES: No visual changes or eye pain ENT: No hearing changes.  No history of nose bleeds.   RESPIRATORY: No cough, wheezing and shortness of breath.   CARDIOVASCULAR: Negative for chest pain, and palpitations.   GI: Negative for abdominal discomfort, blood in stools or black stools.  No recent change in bowel habits.   GU:  No history of incontinence.   MUSCLOSKELETAL: No history of joint pain or swelling.  No myalgias.   SKIN: Negative for lesions, rash, and itching.  ENDOCRINE: Negative for cold or heat intolerance, polydipsia or goiter.   PSYCH:  No depression or anxiety symptoms.   NEURO: As Above.   Vital Signs:  BP 124/78  Pulse 69  Ht 5' 7.72" (1.72 m)  Wt 168 lb 5 oz (76.346 kg)  BMI 25.81 kg/m2  SpO2 94%   Neurological Exam: MENTAL STATUS including orientation to time, place, person, recent and remote memory, attention span and concentration, language, and fund of knowledge is normal.  Speech is not dysarthric.  CRANIAL NERVES: No visual field defects.  Pupils equal round and reactive to light.  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   Mild left ptosis. Normal facial sensation.  Face is symmetric. Palate elevates symmetrically.  Tongue is midline.  MOTOR:  Motor strength is 5/5 in all extremities.  Bilateral intention tremor, worse on left side.  MSRs:  Reflexes are 2+/4 in upper extremities and absent in lower extremities.  SENSORY:  Exquisite hyperesthesia to pin prick over the left T4-T8 dermatomes.   COORDINATION/GAIT:  Normal finger-to- nose-finger and heel-to-shin.  Intact rapid alternating movements bilaterally.  Stooped posture, gait narrow based and stable.   Data: MRI brain 02/03/2011:  1. No acute intracranial abnormality.  2. Atrophy and diffuse white matter change. While this could represent a demyelinating process, the overall picture is more  compatible with chronic microvascular ischemic change. The  3. Remote lacunar infarcts throughout the basal ganglia bilaterally support a chronic ischemic process.  4. Postoperative changes of the right nasal cavity and maxillary sinus.     IMPRESSION: Mr. Stephen Nunez is a delightful 9 year-old gentleman presenting for evaluation of post-herpetic neuralgia since 2000. Symptoms are clearly severe and functionally interfering with his daily activities. It is unfortunate that he has been dealing with this pain for such a long time without adequate pain relief, despite trying Lyrica, gabapentin, lidocaine patch, nerve blocks, and drug trials. I explained that given the severity of his pain, it would be challenging and may involve a series medication trials until we find something that works, but fortunately, there are a number of medications which have not been given an adequate trial at the optimal dose, including Lyrica and gabapentin.  He did not tolerate nortriptyline due to cognitive side effects at 50mg  daily.    At this juncture, will retry neurontin and try to optimize the dose.  Cymbalta may be added going forward.   PLAN/RECOMMENDATIONS:  1. Neurontin 300mg  and  titrate to 600mg  BID. Titration scheduled provided and discussed with patient. 2. Stop nortriptyline 3. Patient to call in 1 month with update to determine further titration 4. Return to clinic in 69-months   The duration of this appointment visit was 25 minutes of face-to-face time with the patient.  Greater than 50% of this time was spent in counseling, explanation of diagnosis, planning of further management, and coordination of care.   Thank you for allowing me to participate in patient's care.  If I can answer any additional questions, I would be pleased to do so.    Sincerely,    Lyncoln Ledgerwood K. Posey Pronto, DO

## 2013-12-04 ENCOUNTER — Telehealth: Payer: Self-pay | Admitting: Neurology

## 2013-12-04 NOTE — Telephone Encounter (Signed)
Discussed with patient to reduce neurontin to 300mg  qhs for two weeks then call with update.  Increasing dose to 300mg  BID made him very sleepy during the day, but he is tolerating the nighttime dose.  He has not noticed any appreciable benefit thus far, however.  Stephen Tissue K. Posey Pronto, DO

## 2013-12-04 NOTE — Telephone Encounter (Signed)
Stephen Nunez said that he had taken 1 Gabapentin qd for a week then started 2 on Saturday.  He said he felt like a zombie. He slept all day and his balance was off when he tried to walk.  He doesn't think this is working for him.  Can he stop taking it or does he need to try it a little longer?

## 2013-12-04 NOTE — Telephone Encounter (Signed)
Needs to talk to someone about the medication please call [phone]

## 2013-12-18 ENCOUNTER — Telehealth: Payer: Self-pay | Admitting: Neurology

## 2013-12-18 NOTE — Telephone Encounter (Signed)
We can try Cymbalta which is another medication.  Let him know that we have samples if he would like to try.  Janisha Bueso K. Posey Pronto, DO

## 2013-12-18 NOTE — Telephone Encounter (Signed)
Left message for patient.  Informed him that I will forward this note to you and instructed him to call if there was anything else we could do for him.

## 2013-12-18 NOTE — Telephone Encounter (Signed)
Attempted to contact patient.  No answer and no voicemail. 

## 2013-12-18 NOTE — Telephone Encounter (Signed)
Pt called stating that his treatment with Dr. Posey Pronto is not working for him and feels that He should explore different options. Pt canceled his 01/31/14 f/u appt.  C/b [phone]

## 2013-12-21 NOTE — Telephone Encounter (Signed)
Noted.  Anastasia Tompson K. Heath Badon, DO   

## 2013-12-21 NOTE — Telephone Encounter (Signed)
Patient said that he feels better when off medicines.  He wanted to let us know that he appreciates everything.  Instructed him to call if he needs anything.

## 2014-01-31 ENCOUNTER — Ambulatory Visit: Payer: Medicare Other | Admitting: Neurology

## 2014-07-16 DIAGNOSIS — Z0001 Encounter for general adult medical examination with abnormal findings: Secondary | ICD-10-CM | POA: Insufficient documentation

## 2014-08-15 NOTE — Progress Notes (Signed)
Cardiology Office Note   Date:  08/16/2014   ID:  Stephen Nunez, DOB [birth year], MRN [id number]  PCP:  Gus Height, MD  Cardiologist:   Dorris Carnes, MD   Patient presents for evaluation of murmur   History of Present Illness: Stephen Nunez is a 81 y.o. male with a history of murmur and LBBB   I saw him for the first time inJan 2015  Myoview was normal  Echo showed aortic sclerosis  Breathing OK   Patient uses stair stepper in AM  No dizziness  No SOB No CP  Activity good     Current Outpatient Prescriptions  Medication Sig Dispense Refill  . aspirin 325 MG tablet Take 325 mg by mouth daily.    . Coenzyme Q10 150 MG CAPS Take 1 capsule by mouth daily.     . cyanocobalamin 500 MCG tablet Take 500 mcg by mouth daily.    . folic acid (FOLVITE) 086 MCG tablet Take 400 mcg by mouth daily.    Marland Kitchen gabapentin (NEURONTIN) 300 MG capsule Take 1 capsule (300 mg total) by mouth 3 (three) times daily. 180 capsule 3  . lidocaine (XYLOCAINE) 5 % ointment Apply 1 application topically 2 (two) times daily as needed. (Patient taking differently: Apply 1 application topically 2 (two) times daily as needed (pain). ) 35.44 g 3  . Multiple Vitamin (MULTIVITAMIN) tablet Take 1 tablet by mouth daily.    . Omega-3 Fatty Acids (FISH OIL) 1200 MG CAPS Take 1 capsule by mouth daily.     Marland Kitchen OVER THE COUNTER MEDICATION Take 90 mg by mouth daily. POTASSIUM    . pravastatin (PRAVACHOL) 40 MG tablet Take 40 mg by mouth daily.    . quinapril (ACCUPRIL) 20 MG tablet Take 20 mg by mouth daily.    Marland Kitchen UNABLE TO FIND Med Name: muscadine grape pills Take 1 capsule by mouth daily     No current facility-administered medications for this visit.    Allergies:   Review of patient's allergies indicates no known allergies.   Past Medical History  Diagnosis Date  . Hyperlipidemia   . Hypertension   . Prostate cancer     Past Surgical History  Procedure Laterality Date  . Prostate surgery    . Hernia repair        Social History:  The patient  reports that he has never smoked. He does not have any smokeless tobacco history on file.   Family History:  The patient's family history includes Cancer in his mother; Heart attack in his brother and father; Multiple sclerosis in his brother; Other in his mother.    ROS:  Please see the history of present illness. All other systems are reviewed and  Negative to the above problem except as noted.    PHYSICAL EXAM: VS:  BP 132/68 mmHg  Pulse 68  Ht 5\' 9"  (1.753 m)  Wt 169 lb 6.4 oz (76.839 kg)  BMI 25.00 kg/m2  SpO2 98%  GEN: Well nourished, well developed, in no acute distress HEENT: normal Neck: no JVD, carotid bruits, or masses Cardiac: RRR; no murmurs, rubs, or gallops,no edema  Respiratory:  clear to auscultation bilaterally, normal work of breathing GI: soft, nontender, nondistended, + BS  No hepatomegaly  MS: no deformity Moving all extremities   Skin: warm and dry, no rash Neuro:  Strength and sensation are intact Psych: euthymic mood, full affect   EKG:  EKG is ordered today.SR 68  LBBB   Lipid  Panel No results found for: CHOL, TRIG, HDL, CHOLHDL, VLDL, LDLCALC, LDLDIRECT    Wt Readings from Last 3 Encounters:  08/16/14 169 lb 6.4 oz (76.839 kg)  11/27/13 168 lb 5 oz (76.346 kg)  09/21/13 175 lb 5 oz (79.521 kg)      ASSESSMENT AND PLAN:  1.  LBBB  No symptoms to suggest further delay  Follow  2.  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  HL  Check labs that were done at Dr Harrington Challenger' office    4  HTN  Good control   Will set f/u in Spring 2017   Current medicines are reviewed at length with the patient today.  The patient does not have concerns regarding medicines.  The following changes have been made:   Labs/ tests ordered today include:  None  Labs from Melinda Crutch' office  No orders of the defined types were placed in this encounter.     Disposition:   FU with  in  Spring  2017  Signed, Dorris Carnes, MD  08/16/2014 8:22 AM    [facility] [place], [place], [place]  [address] Phone: [phone]; Fax: [phone]

## 2014-08-16 ENCOUNTER — Ambulatory Visit (INDEPENDENT_AMBULATORY_CARE_PROVIDER_SITE_OTHER): Payer: PPO | Admitting: Internal Medicine

## 2014-08-16 ENCOUNTER — Encounter: Payer: Self-pay | Admitting: Internal Medicine

## 2014-08-16 VITALS — BP 132/68 | HR 68 | Ht 69.0 in | Wt 169.4 lb

## 2014-08-16 DIAGNOSIS — Z8679 Personal history of other diseases of the circulatory system: Secondary | ICD-10-CM

## 2014-08-16 NOTE — Patient Instructions (Signed)
Your physician recommends that you continue on your current medications as directed. Please refer to the Current Medication list given to you today. Your physician wants you to follow-up next April with Dr. Dorris Carnes.   You will receive a reminder letter in the mail two months in advance. If you don't receive a letter, please call our office to schedule the follow-up appointment.

## 2015-04-05 ENCOUNTER — Other Ambulatory Visit (HOSPITAL_COMMUNITY): Payer: Self-pay | Admitting: Anesthesiology

## 2015-04-23 ENCOUNTER — Encounter (HOSPITAL_COMMUNITY)
Admission: RE | Admit: 2015-04-23 | Discharge: 2015-04-23 | Disposition: A | Discharge status: Home / Self Care | Payer: PPO | Source: Ambulatory Visit | Attending: Anesthesiology | Admitting: Anesthesiology

## 2015-04-23 ENCOUNTER — Encounter (HOSPITAL_COMMUNITY): Payer: Self-pay

## 2015-04-23 DIAGNOSIS — Z8546 Personal history of malignant neoplasm of prostate: Secondary | ICD-10-CM | POA: Diagnosis not present

## 2015-04-23 DIAGNOSIS — I10 Essential (primary) hypertension: Secondary | ICD-10-CM | POA: Diagnosis not present

## 2015-04-23 DIAGNOSIS — Z79899 Other long term (current) drug therapy: Secondary | ICD-10-CM | POA: Diagnosis not present

## 2015-04-23 DIAGNOSIS — G8929 Other chronic pain: Secondary | ICD-10-CM | POA: Diagnosis present

## 2015-04-23 DIAGNOSIS — E785 Hyperlipidemia, unspecified: Secondary | ICD-10-CM | POA: Diagnosis not present

## 2015-04-23 DIAGNOSIS — Z7982 Long term (current) use of aspirin: Secondary | ICD-10-CM | POA: Diagnosis not present

## 2015-04-23 DIAGNOSIS — B0229 Other postherpetic nervous system involvement: Secondary | ICD-10-CM | POA: Diagnosis not present

## 2015-04-23 HISTORY — DX: Personal history of other infectious and parasitic diseases: Z86.19

## 2015-04-23 LAB — CBC
HCT: 38.5 % — ABNORMAL LOW (ref 39.0–52.0)
Hemoglobin: 12.3 g/dL — ABNORMAL LOW (ref 13.0–17.0)
MCH: 30.6 pg (ref 26.0–34.0)
MCHC: 31.9 g/dL (ref 30.0–36.0)
MCV: 95.8 fL (ref 78.0–100.0)
Platelets: 271 10*3/uL (ref 150–400)
RBC: 4.02 MIL/uL — ABNORMAL LOW (ref 4.22–5.81)
RDW: 13.1 % (ref 11.5–15.5)
WBC: 6.3 10*3/uL (ref 4.0–10.5)

## 2015-04-23 LAB — SURGICAL PCR SCREEN
MRSA, PCR: NEGATIVE
Staphylococcus aureus: POSITIVE — AB

## 2015-04-23 LAB — BASIC METABOLIC PANEL
Anion gap: 6 (ref 5–15)
BUN: 27 mg/dL — ABNORMAL HIGH (ref 6–20)
CO2: 30 mmol/L (ref 22–32)
Calcium: 9.7 mg/dL (ref 8.9–10.3)
Chloride: 105 mmol/L (ref 101–111)
Creatinine, Ser: 1.12 mg/dL (ref 0.61–1.24)
GFR calc Af Amer: >60 mL/min (ref >60)
GFR calc non Af Amer: >60 mL/min (ref >60)
Glucose, Bld: 104 mg/dL — ABNORMAL HIGH (ref 65–99)
Potassium: 4.7 mmol/L (ref 3.5–5.1)
Sodium: 141 mmol/L (ref 135–145)

## 2015-04-23 NOTE — Pre-Procedure Instructions (Signed)
KEISON GLENDINNING  04/23/2015      [id number] - [place], [place] [address] [place] [address] Phone: [phone] Fax: [phone]    Your procedure is scheduled on  Friday  04/26/15  Report to [facility] Admitting at 530 A.M.  Call this number if you have problems the morning of surgery:  [phone]   Remember:  Do not eat food or drink liquids after midnight.  Take these medicines the morning of surgery with A SIP OF WATER   NONE  (STOP ASPIRIN, FISH OIL, HERBAL MEDICINES, BC'S OR GOODY'S, IBUPROFEN, ADVIL)   Do not wear jewelry, make-up or nail polish.  Do not wear lotions, powders, or perfumes.  You may wear deodorant.  Do not shave 48 hours prior to surgery.  Men may shave face and neck.  Do not bring valuables to the hospital.  [facility] is not responsible for any belongings or valuables.  Contacts, dentures or bridgework may not be worn into surgery.  Leave your suitcase in the car.  After surgery it may be brought to your room.  For patients admitted to the hospital, discharge time will be determined by your treatment team.  Patients discharged the day of surgery will not be allowed to drive home.   Name and phone number of your driver:    Special instructions:  [place] - Preparing for Surgery  Before surgery, you can play an important role.  Because skin is not sterile, your skin needs to be as free of germs as possible.  You can reduce the number of germs on you skin by washing with CHG (chlorahexidine gluconate) soap before surgery.  CHG is an antiseptic cleaner which kills germs and bonds with the skin to continue killing germs even after washing.  Please DO NOT use if you have an allergy to CHG or antibacterial soaps.  If your skin becomes reddened/irritated stop using the CHG and inform your nurse when you arrive at Short Stay.  Do not shave (including legs and underarms) for at least 48 hours prior to the first  CHG shower.  You may shave your face.  Please follow these instructions carefully:   1.  Shower with CHG Soap the night before surgery and the                                morning of Surgery.  2.  If you choose to wash your hair, wash your hair first as usual with your       normal shampoo.  3.  After you shampoo, rinse your hair and body thoroughly to remove the                      Shampoo.  4.  Use CHG as you would any other liquid soap.  You can apply chg directly       to the skin and wash gently with scrungie or a clean washcloth.  5.  Apply the CHG Soap to your body ONLY FROM THE NECK DOWN.        Do not use on open wounds or open sores.  Avoid contact with your eyes,       ears, mouth and genitals (private parts).  Wash genitals (private parts)       with your normal soap.  6.  Wash thoroughly, paying special attention to  the area where your surgery        will be performed.  7.  Thoroughly rinse your body with warm water from the neck down.  8.  DO NOT shower/wash with your normal soap after using and rinsing off       the CHG Soap.  9.  Pat yourself dry with a clean towel.            10.  Wear clean pajamas.            11.  Place clean sheets on your bed the night of your first shower and do not        sleep with pets.  Day of Surgery  Do not apply any lotions/deoderants the morning of surgery.  Please wear clean clothes to the hospital/surgery center.    Please read over the following fact sheets that you were given. Pain Booklet, Coughing and Deep Breathing, MRSA Information and Surgical Site Infection Prevention

## 2015-04-25 MED ORDER — CEFAZOLIN SODIUM-DEXTROSE 2-3 GM-% IV SOLR
2.0000 g | INTRAVENOUS | Status: AC
  Administered 2015-04-26: 2 g via INTRAVENOUS
  Filled 2015-04-25: qty 50

## 2015-04-25 NOTE — H&P (Signed)
Stephen Nunez is an 81 y.o. male.   Chief Complaint: left chest wall pain HPI: Patient reports contracting shingles about 15 years ago, which subsided with medication.  He's had no recurrence of shingles.  He feels the symptoms have been stable since.  He describes intense neuropathic pain in the left thoracic region--which hasn't superior aspect across his nipple line, consistent with T4 down to T10.  Symptoms are most severe in the midportion of that distribution, and in that midportion will radiate to the anterior chest wall.  It is rather constant without any exacerbating or ameliorating features but he does notice that any contact, even with his clothing, can cause intense pain.  To that end, he has modified his clothing at home which helps avoid contact.  Symptoms come extremely severe with contact, and with cold.  T and character of his symptoms is described as throbbing and burning and paresthetic.  No report of any fevers, chills, night sweats or bowel or bladder dysfunction.  He apparently has seen a number of providers including Dr. Wilford Grist, Vira Blanco and [place].  Interventions have included thoracic epidurals, acupuncture, anti-inflammatories, topical agents including Lidoderm and Zostrix, and over-the-counter capsaicin cream which he did not tolerate well; he reports that even over-the-counter capsaicin creams aggravated his symptoms and kept him in bed for 3 or 4 days.  For medication standpoint he has tried Cymbalta, Lyrica and Neurontin which did not help, in addition to the TCA's and opioid analgesics.  He has been managed on Ultram 50 mg typically a maximum of one per day.  He notes at least a 15-20% reduction in pain with improvement in function when he takes one.  He combines it with some regular strength Tylenol.  Denies any side effects whatsoever including sedation.  He denies any history of any substance abuse.   The patient came to see Dr. Brien Few, discussion turned to considerations  of a capsaicin topical patch, and/or spinal cord stimulator therapy.  Dr. Brien Few referred the patient to me for consideration of these.  The patient also had some question specifically about nerve blocks. Based on this history, and physical exam findings, I recommended spinal cord stimulator trial.  The patient had no problem identified on the requisite psychological evaluation component, and underwent a SCS trial; a single 16 contact SCS lead was placed left paramedian, with 3 contacts into the T4 space, and the rest of the contacts distributed over T5-7. In the course of that trial he was able to tolerate clothing, contact, and increase his activity level, except for very small, quarter-sized patch near the thoracic spine, and he still placed barrier precautions over his nipple. He was very pleased with his outcome.  He is requesting permanent implantation of an SCS.   Past Medical History  Diagnosis Date  . Hyperlipidemia   . Hypertension   . Prostate cancer   . History of shingles     2000    Past Surgical History  Procedure Laterality Date  . Prostate surgery    . Hernia repair      Family History  Problem Relation Age of Onset  . Heart attack Father   . Other Mother     Post-op complications, deceased 91  . Cancer Mother   . Heart attack Brother     Deceased 16  . Multiple sclerosis Brother     Deceased, 52   Social History:  reports that he has never smoked. He does not have any smokeless tobacco history on  file. He reports that he does not drink alcohol or use illicit drugs.  Allergies: No Known Allergies  Medications Prior to Admission  Medication Sig Dispense Refill  . aspirin 81 MG tablet Take 81 mg by mouth daily.    . Coenzyme Q10 150 MG CAPS Take 1 capsule by mouth daily.     . cyanocobalamin 500 MCG tablet Take 500 mcg by mouth daily.    . folic acid (FOLVITE) 270 MCG tablet Take 400 mcg by mouth daily.    . Multiple Vitamin (MULTIVITAMIN) tablet Take 1 tablet by  mouth daily.    . Omega-3 Fatty Acids (FISH OIL) 1200 MG CAPS Take 1 capsule by mouth daily.     . Potassium 99 MG TABS Take 1 tablet by mouth daily.    . pravastatin (PRAVACHOL) 40 MG tablet Take 40 mg by mouth daily.    . quinapril (ACCUPRIL) 20 MG tablet Take 20 mg by mouth daily.    Marland Kitchen UNABLE TO FIND Med Name: muscadine grape pills Take 1 capsule by mouth daily      No results found for this or any previous visit (from the past 25 hour(s)). No results found.  Review of Systems  Constitutional: Negative.   HENT: Negative.   Eyes: Negative.   Respiratory: Negative.   Cardiovascular: Negative.   Gastrointestinal: Negative.   Musculoskeletal: Negative.   Skin: Negative.   Neurological: Negative.   Endo/Heme/Allergies: Negative.   Psychiatric/Behavioral: Negative.     Blood pressure 138/56, pulse 65, temperature 97 F (36.1 C), temperature source Oral, resp. rate 18, weight 75.297 kg (166 lb), SpO2 98 %. Physical Exam  Constitutional: He is oriented to person, place, and time. He appears well-developed and well-nourished.  HENT:  Head: Normocephalic and atraumatic.  Eyes: EOM are normal. Pupils are equal, round, and reactive to light.  Neck: Normal range of motion.  Cardiovascular: Normal rate and regular rhythm.   Musculoskeletal: Normal range of motion.  Neurological: He is alert and oriented to person, place, and time.  Skin: Skin is warm and dry.  Psychiatric: He has a normal mood and affect. His behavior is normal. Judgment and thought content normal.     Assessment/Plan A) post-herpetic neuralgia, chronic pain P: permanent SCS, [employer]; will attempt to place lead into T3, stagger 2nd lead.  Bonna Gains 04/26/2015, 7:19 AM

## 2015-04-26 ENCOUNTER — Ambulatory Visit (HOSPITAL_COMMUNITY): Payer: PPO | Admitting: Anesthesiology

## 2015-04-26 ENCOUNTER — Encounter (HOSPITAL_COMMUNITY): Payer: Self-pay | Admitting: Certified Registered"

## 2015-04-26 ENCOUNTER — Encounter (HOSPITAL_COMMUNITY)
Admission: RE | Disposition: A | Discharge status: Home / Self Care | Payer: Self-pay | Source: Ambulatory Visit | Attending: Anesthesiology

## 2015-04-26 ENCOUNTER — Ambulatory Visit (HOSPITAL_COMMUNITY): Payer: PPO

## 2015-04-26 ENCOUNTER — Ambulatory Visit (HOSPITAL_COMMUNITY)
Admission: RE | Admit: 2015-04-26 | Discharge: 2015-04-26 | Disposition: A | Discharge status: Home / Self Care | Payer: PPO | Source: Ambulatory Visit | Attending: Anesthesiology | Admitting: Anesthesiology

## 2015-04-26 DIAGNOSIS — E785 Hyperlipidemia, unspecified: Secondary | ICD-10-CM | POA: Diagnosis not present

## 2015-04-26 DIAGNOSIS — Z8546 Personal history of malignant neoplasm of prostate: Secondary | ICD-10-CM | POA: Insufficient documentation

## 2015-04-26 DIAGNOSIS — Z7982 Long term (current) use of aspirin: Secondary | ICD-10-CM | POA: Insufficient documentation

## 2015-04-26 DIAGNOSIS — I10 Essential (primary) hypertension: Secondary | ICD-10-CM | POA: Diagnosis not present

## 2015-04-26 DIAGNOSIS — B0229 Other postherpetic nervous system involvement: Secondary | ICD-10-CM | POA: Diagnosis not present

## 2015-04-26 DIAGNOSIS — G8929 Other chronic pain: Secondary | ICD-10-CM | POA: Diagnosis not present

## 2015-04-26 DIAGNOSIS — Z419 Encounter for procedure for purposes other than remedying health state, unspecified: Secondary | ICD-10-CM

## 2015-04-26 DIAGNOSIS — Z79899 Other long term (current) drug therapy: Secondary | ICD-10-CM | POA: Insufficient documentation

## 2015-04-26 HISTORY — PX: SPINAL CORD STIMULATOR INSERTION: SHX5378

## 2015-04-26 SURGERY — INSERTION, SPINAL CORD STIMULATOR, LUMBAR
Anesthesia: Monitor Anesthesia Care

## 2015-04-26 MED ORDER — 0.9 % SODIUM CHLORIDE (POUR BTL) OPTIME
TOPICAL | Status: DC | PRN
  Administered 2015-04-26: 1000 mL

## 2015-04-26 MED ORDER — PROPOFOL 10 MG/ML IV BOLUS
INTRAVENOUS | Status: AC
  Filled 2015-04-26: qty 20

## 2015-04-26 MED ORDER — BUPIVACAINE-EPINEPHRINE (PF) 0.5% -1:200000 IJ SOLN
INTRAMUSCULAR | Status: DC | PRN
  Administered 2015-04-26: 30 mL via PERINEURAL

## 2015-04-26 MED ORDER — PROPOFOL 10 MG/ML IV BOLUS
INTRAVENOUS | Status: DC | PRN
  Administered 2015-04-26 (×3): 10 mg via INTRAVENOUS
  Administered 2015-04-26: 20 mg via INTRAVENOUS
  Administered 2015-04-26 (×2): 10 mg via INTRAVENOUS

## 2015-04-26 MED ORDER — HYDROCODONE-ACETAMINOPHEN 7.5-325 MG PO TABS: 1.0000 | ORAL_TABLET | Freq: Four times a day (QID) | ORAL | Status: DC | PRN

## 2015-04-26 MED ORDER — FENTANYL CITRATE (PF) 250 MCG/5ML IJ SOLN
INTRAMUSCULAR | Status: DC | PRN
  Administered 2015-04-26: 25 ug via INTRAVENOUS

## 2015-04-26 MED ORDER — PROMETHAZINE HCL 25 MG/ML IJ SOLN: 6.2500 mg | INTRAMUSCULAR | Status: DC | PRN

## 2015-04-26 MED ORDER — SODIUM CHLORIDE 0.9 % IR SOLN
Status: DC | PRN
  Administered 2015-04-26: 08:00:00

## 2015-04-26 MED ORDER — FENTANYL CITRATE (PF) 100 MCG/2ML IJ SOLN: 25.0000 ug | INTRAMUSCULAR | Status: DC | PRN

## 2015-04-26 MED ORDER — PROPOFOL 500 MG/50ML IV EMUL
INTRAVENOUS | Status: DC | PRN
  Administered 2015-04-26: 75 ug/kg/min via INTRAVENOUS

## 2015-04-26 MED ORDER — CEPHALEXIN 500 MG PO CAPS: 500.0000 mg | ORAL_CAPSULE | Freq: Three times a day (TID) | ORAL | Status: DC

## 2015-04-26 MED ORDER — FENTANYL CITRATE (PF) 250 MCG/5ML IJ SOLN
INTRAMUSCULAR | Status: AC
  Filled 2015-04-26: qty 5

## 2015-04-26 MED ORDER — LACTATED RINGERS IV SOLN
INTRAVENOUS | Status: DC | PRN
  Administered 2015-04-26: 07:00:00 via INTRAVENOUS

## 2015-04-26 SURGICAL SUPPLY — 61 items
ANCHOR CLIK X NEURO (Stimulator) ×2 IMPLANT
BAG DECANTER FOR FLEXI CONT (MISCELLANEOUS) ×2 IMPLANT
BENZOIN TINCTURE PRP APPL 2/3 (GAUZE/BANDAGES/DRESSINGS) IMPLANT
BINDER ABDOMINAL 12 ML 46-62 (SOFTGOODS) ×2 IMPLANT
BLADE CLIPPER SURG (BLADE) IMPLANT
CABLE/EXTENSION OR 1X16 61 (CABLE) ×4 IMPLANT
CHLORAPREP W/TINT 26ML (MISCELLANEOUS) ×2 IMPLANT
CLIP TI WIDE RED SMALL 6 (CLIP) ×2 IMPLANT
DRAPE C-ARM 42X72 X-RAY (DRAPES) ×2 IMPLANT
DRAPE C-ARMOR (DRAPES) ×2 IMPLANT
DRAPE LAPAROTOMY 100X72X124 (DRAPES) ×2 IMPLANT
DRAPE POUCH INSTRU U-SHP 10X18 (DRAPES) ×2 IMPLANT
DRAPE SURG 17X23 STRL (DRAPES) ×2 IMPLANT
DRSG OPSITE POSTOP 3X4 (GAUZE/BANDAGES/DRESSINGS) ×2 IMPLANT
DRSG OPSITE POSTOP 4X6 (GAUZE/BANDAGES/DRESSINGS) IMPLANT
ELECT REM PT RETURN 9FT ADLT (ELECTROSURGICAL) ×2
ELECTRODE REM PT RTRN 9FT ADLT (ELECTROSURGICAL) ×1 IMPLANT
GAUZE SPONGE 4X4 16PLY XRAY LF (GAUZE/BANDAGES/DRESSINGS) ×2 IMPLANT
GLOVE BIOGEL PI IND STRL 7.5 (GLOVE) ×1 IMPLANT
GLOVE BIOGEL PI INDICATOR 7.5 (GLOVE) ×1
GLOVE ECLIPSE 7.5 STRL STRAW (GLOVE) ×2 IMPLANT
GLOVE EXAM NITRILE LRG STRL (GLOVE) IMPLANT
GLOVE EXAM NITRILE MD LF STRL (GLOVE) IMPLANT
GLOVE EXAM NITRILE XL STR (GLOVE) IMPLANT
GLOVE EXAM NITRILE XS STR PU (GLOVE) IMPLANT
GOWN STRL REUS W/ TWL LRG LVL3 (GOWN DISPOSABLE) IMPLANT
GOWN STRL REUS W/ TWL XL LVL3 (GOWN DISPOSABLE) IMPLANT
GOWN STRL REUS W/TWL 2XL LVL3 (GOWN DISPOSABLE) IMPLANT
GOWN STRL REUS W/TWL LRG LVL3 (GOWN DISPOSABLE)
GOWN STRL REUS W/TWL XL LVL3 (GOWN DISPOSABLE)
IPG PRECISION SPECTRA (Stimulator) ×2 IMPLANT
KIT BASIN OR (CUSTOM PROCEDURE TRAY) ×2 IMPLANT
KIT CHARGING (KITS) ×1
KIT CHARGING PRECISION NEURO (KITS) ×1 IMPLANT
KIT LEAD INFINION (Lead) ×4 IMPLANT
KIT PAT PROGRAM FREELINK (KITS) ×1 IMPLANT
KIT ROOM TURNOVER OR (KITS) ×2 IMPLANT
KIT SPLITTER 30CM 2X8 (Stimulator) ×4 IMPLANT
LIQUID BAND (GAUZE/BANDAGES/DRESSINGS) ×2 IMPLANT
NEEDLE 18GX1X1/2 (RX/OR ONLY) (NEEDLE) IMPLANT
NEEDLE HYPO 25X1 1.5 SAFETY (NEEDLE) ×2 IMPLANT
NS IRRIG 1000ML POUR BTL (IV SOLUTION) ×2 IMPLANT
PACK LAMINECTOMY NEURO (CUSTOM PROCEDURE TRAY) ×2 IMPLANT
PAD ARMBOARD 7.5X6 YLW CONV (MISCELLANEOUS) ×4 IMPLANT
REMOTE CONTROL KIT (KITS) ×2
SPONGE LAP 4X18 X RAY DECT (DISPOSABLE) ×2 IMPLANT
SPONGE SURGIFOAM ABS GEL SZ50 (HEMOSTASIS) IMPLANT
STAPLER SKIN PROX WIDE 3.9 (STAPLE) IMPLANT
STRIP CLOSURE SKIN 1/2X4 (GAUZE/BANDAGES/DRESSINGS) IMPLANT
SUT MNCRL AB 4-0 PS2 18 (SUTURE) ×2 IMPLANT
SUT SILK 0 (SUTURE) ×1
SUT SILK 0 MO-6 18XCR BRD 8 (SUTURE) ×1 IMPLANT
SUT SILK 0 TIES 10X30 (SUTURE) IMPLANT
SUT SILK 2 0 TIES 10X30 (SUTURE) IMPLANT
SUT VIC AB 2-0 CP2 18 (SUTURE) ×6 IMPLANT
SYR EPIDURAL 5ML GLASS (SYRINGE) ×2 IMPLANT
SYRINGE 10CC LL (SYRINGE) IMPLANT
TOWEL OR 17X24 6PK STRL BLUE (TOWEL DISPOSABLE) ×2 IMPLANT
TOWEL OR 17X26 10 PK STRL BLUE (TOWEL DISPOSABLE) ×2 IMPLANT
WATER STERILE IRR 1000ML POUR (IV SOLUTION) ×2 IMPLANT
YANKAUER SUCT BULB TIP NO VENT (SUCTIONS) ×2 IMPLANT

## 2015-04-26 NOTE — Discharge Instructions (Signed)
Dr. Aasia Peavler Post-Op Orders ° °• Ice Pack - 20 minutes on (in a pillow case), and 20 minutes off. Wear the ice pack UNDER the binder. °• Follow up in office, they will call you for an appointment in 10 days to 2 weeks. °• Increase activity gradually.   °• No lifting anything heavier than a gallon of milk (10 pounds) until seen in the office. °• Advance diet slowly as tolerated. °• Dressing care:  Keep dressing dry for 3 days, and on Post-op day 4, may shower. °• Call for fever, drainage, and redness. °• No swimming or bathing in a bathtub (do not get into standing water). °•  °

## 2015-04-26 NOTE — Anesthesia Preprocedure Evaluation (Addendum)
Anesthesia Evaluation  Patient identified by MRN, date of birth, ID band Patient awake    Reviewed: Allergy & Precautions, NPO status , Patient's Chart, lab work & pertinent test results  Airway Mallampati: II  TM Distance: >3 FB Neck ROM: Full    Dental  (+) Teeth Intact, Dental Advisory Given   Pulmonary neg pulmonary ROS,    breath sounds clear to auscultation       Cardiovascular hypertension, Pt. on medications  Rhythm:Regular Rate:Normal     Neuro/Psych negative neurological ROS     GI/Hepatic negative GI ROS, Neg liver ROS,   Endo/Other  negative endocrine ROS  Renal/GU negative Renal ROS     Musculoskeletal negative musculoskeletal ROS (+)   Abdominal   Peds  Hematology negative hematology ROS (+)   Anesthesia Other Findings   Reproductive/Obstetrics                           Anesthesia Physical Anesthesia Plan  ASA: II  Anesthesia Plan: MAC   Post-op Pain Management:    Induction: Intravenous  Airway Management Planned: Simple Face Mask and Natural Airway  Additional Equipment:   Intra-op Plan:   Post-operative Plan:   Informed Consent: I have reviewed the patients History and Physical, chart, labs and discussed the procedure including the risks, benefits and alternatives for the proposed anesthesia with the patient or authorized representative who has indicated his/her understanding and acceptance.     Plan Discussed with: CRNA  Anesthesia Plan Comments:         Anesthesia Quick Evaluation

## 2015-04-26 NOTE — Anesthesia Postprocedure Evaluation (Signed)
  Anesthesia Post-op Note  Patient: Stephen Nunez  Procedure(s) Performed: Procedure(s): LUMBAR SPINAL CORD STIMULATOR INSERTION (N/A)  Patient Location: PACU  Anesthesia Type:MAC  Level of Consciousness: awake and alert   Airway and Oxygen Therapy: Patient Spontanous Breathing  Post-op Pain: none  Post-op Assessment: Post-op Vital signs reviewed              Post-op Vital Signs: Reviewed  Last Vitals:  Filed Vitals:   04/26/15 1000  BP: 160/68  Pulse: 56  Temp:   Resp: 17    Complications: No apparent anesthesia complications

## 2015-04-26 NOTE — Transfer of Care (Signed)
Immediate Anesthesia Transfer of Care Note  Patient: Stephen Nunez  Procedure(s) Performed: Procedure(s): LUMBAR SPINAL CORD STIMULATOR INSERTION (N/A)  Patient Location: PACU  Anesthesia Type:MAC  Level of Consciousness: awake, alert  and oriented  Airway & Oxygen Therapy: Patient Spontanous Breathing and Patient connected to nasal cannula oxygen  Post-op Assessment: Report given to RN, Post -op Vital signs reviewed and stable and Patient moving all extremities X 4  Post vital signs: Reviewed and stable  Last Vitals:  Filed Vitals:   04/26/15 0624  BP: 138/56  Pulse: 65  Temp: 36.1 C  Resp: 18    Complications: No apparent anesthesia complications

## 2015-04-26 NOTE — Op Note (Signed)
PREOP DX: postherpetic neuralgia; chronic pain  POSTOP DX: same as preop  PROCEDURES PERFORMED: 1) implantation of 2 70 cm 16 contact [place] leads 2) implantation of Physicist, medical IPG 3) I/O fluoro with interpretation 4) complex postoperative SCS programming  SURGEON: Virginio Isidore  ASSISTANT: none  ANESTHESIA: MAC/TIVA  EBL:<46mL  DESCRIPTION OF PROCEDURE: After a discussion of risks, benefits and alternatives, written informed consent was obtained. The patient was taken to the operative suite where he was placed on the table in the prone position with chest rolls and a head/face cushion. A timeout was taken, identifying the patient, procedure, personnel, equipment, antibiotic administration and staff concerns (of which there were none).   The cervical, thoracic and lumbar spine were widely prepped with duraprep, and draped into a sterile field. Fluoroscopy was utilized to plan a left paramedian placement of leads into the T12-L1 space and then after anesthetizing the skin and subcutaneous tissue with 0.25% bupivicaine 1:200K epinephrine, an incision was made and carried down to the dorsal fascia. 14g Touhy needles were used to access the intended space using biplanar fluoroscopy and loss-of-resistance technique, and two 70 cm leads were manipulated into the thoracic epidural space under live fluoroscopy so that the distal tips of one lead was at the cephalad aspect of the T3 vertebral body, just left paramedian, and the other lead was staggered slightly left of the initial lead, and the distal tip at the superior aspect of the T4 vertebral body shadow, The patient was aroused, andthe leads tested to verify good coverage. The patient reported good coverage into both the back and chest wall, and reprorted good coverage to the nipple area on the left.   With good coverage established, the needles and stylets were backed off the leads, with fluoro verifying no migration of  the leads from their tested positions. 2-0 silk sutures were placed into the fascia, tied, and then used to fix the leads to the fascia using [employer] anchors.   Attention was turned to creation of a subcutaneous pocket in the patients left flank. An incision was planned, the skin and subcutaneous tissues anesthetized with 0.25% bupivicaine 1:200K epinephrine, and then an incision made and a pocket developed using blunt dissection and the Bovie electrocautery. Hemostasis was assured in the pocket, and then the leads passed into the pocket using a reverse Seldinger technique. The leads were meticulously cleaned, and inserted into the generator. Impedances were checked and found to be good in each contact; the leads were then fixed into the generator using a self torquing wrench. Impedances were rechecked and found to be good in each contact. 2-0 silk sutures were placed in the medial and lateral corners of the pocket incision, and used to fix the generator into its pocket; extreme care was used to make sure that the lead wires were not incorporated into the generator sutures.   Each incision was copiously irrigated with over 800 mL of bacitracin-containing irrigant. The lumbar incision was closed with 2 layers of interrupted 2-0 vicryl sutures and the skin closed with a running 3-0 monocryl subcuticular stitch and dermabond. The pocket incision was closed with a deep layer of 2-0 interrupted vicryl sutures, and the skin closed with a running 3-0 subcuticular monocryl and dermabond. Sterile dressings were applied and the patient taken to the PACU. Needle, instrument and sponge counts were correct x2 at the end of the case.  COMPLICATIONS: NONE  CONDITION: stable throughout the course of the procedure and immediately afterward  to PACU  DISPOSITION: Discharge home. Pain medicine and prophylactic antibiotics called to pts pharmacy. May remove dressings and shower on post-op day #4. No  submerging.discussed care with wife.

## 2015-04-29 ENCOUNTER — Encounter (HOSPITAL_COMMUNITY): Payer: Self-pay | Admitting: Anesthesiology

## 2015-07-17 DIAGNOSIS — B0229 Other postherpetic nervous system involvement: Secondary | ICD-10-CM | POA: Diagnosis not present

## 2015-07-29 DIAGNOSIS — E7800 Pure hypercholesterolemia, unspecified: Secondary | ICD-10-CM | POA: Diagnosis not present

## 2015-07-29 DIAGNOSIS — I10 Essential (primary) hypertension: Secondary | ICD-10-CM | POA: Diagnosis not present

## 2015-07-29 DIAGNOSIS — Z8546 Personal history of malignant neoplasm of prostate: Secondary | ICD-10-CM | POA: Diagnosis not present

## 2015-07-29 DIAGNOSIS — B0229 Other postherpetic nervous system involvement: Secondary | ICD-10-CM | POA: Diagnosis not present

## 2015-07-29 DIAGNOSIS — Z0000 Encounter for general adult medical examination without abnormal findings: Secondary | ICD-10-CM | POA: Diagnosis not present

## 2015-08-01 ENCOUNTER — Other Ambulatory Visit: Payer: Self-pay | Admitting: Gastroenterology

## 2015-08-01 DIAGNOSIS — Z86010 Personal history of colonic polyps: Secondary | ICD-10-CM | POA: Diagnosis not present

## 2015-08-01 DIAGNOSIS — Z1211 Encounter for screening for malignant neoplasm of colon: Secondary | ICD-10-CM | POA: Diagnosis not present

## 2015-08-01 DIAGNOSIS — D122 Benign neoplasm of ascending colon: Secondary | ICD-10-CM | POA: Diagnosis not present

## 2015-08-01 DIAGNOSIS — D126 Benign neoplasm of colon, unspecified: Secondary | ICD-10-CM | POA: Diagnosis not present

## 2015-08-05 DIAGNOSIS — B0229 Other postherpetic nervous system involvement: Secondary | ICD-10-CM | POA: Diagnosis not present

## 2015-08-19 DIAGNOSIS — B0229 Other postherpetic nervous system involvement: Secondary | ICD-10-CM | POA: Diagnosis not present

## 2015-09-04 DIAGNOSIS — B0229 Other postherpetic nervous system involvement: Secondary | ICD-10-CM | POA: Diagnosis not present

## 2015-09-19 DIAGNOSIS — B0229 Other postherpetic nervous system involvement: Secondary | ICD-10-CM | POA: Diagnosis not present

## 2015-09-23 ENCOUNTER — Encounter: Payer: Self-pay | Admitting: Internal Medicine

## 2015-09-23 ENCOUNTER — Ambulatory Visit (INDEPENDENT_AMBULATORY_CARE_PROVIDER_SITE_OTHER): Payer: PPO | Admitting: Internal Medicine

## 2015-09-23 VITALS — BP 130/80 | HR 80 | Ht 70.0 in | Wt 166.8 lb

## 2015-09-23 DIAGNOSIS — R011 Cardiac murmur, unspecified: Secondary | ICD-10-CM | POA: Diagnosis not present

## 2015-09-23 NOTE — Patient Instructions (Signed)
Your physician recommends that you continue on your current medications as directed. Please refer to the Current Medication list given to you today.  Your physician has requested that you have an echocardiogram. Echocardiography is a painless test that uses sound waves to create images of your heart. It provides your doctor with information about the size and shape of your heart and how well your heart's chambers and valves are working. This procedure takes approximately one hour. There are no restrictions for this procedure.  Your physician wants you to follow-up in: 1 year with Dr. Ross.  You will receive a reminder letter in the mail two months in advance. If you don't receive a letter, please call our office to schedule the follow-up appointment.  

## 2015-09-23 NOTE — Progress Notes (Signed)
Cardiology Office Note   Date:  09/23/2015   ID:  Stephen Nunez, DOB 1931-07-11, MRN [id number]  PCP:  Harle Battiest, MD  Cardiologist:   Dorris Carnes, MD   F/U of murmur and HTN     History of Present Illness: Stephen Nunez is a 82 y.o. male with a history of murmur and LBBB I saw him for the first time inJan 2015 Myoview was normal Echo showed aortic sclerosis   I saw the pt in Feb 2016  Breathing ok  No CP   Harle Battiest increased quinipril to 40    Uses stair stepper every day  Some arm exercises  Walks    Outpatient Prescriptions Prior to Visit  Medication Sig Dispense Refill  . aspirin 81 MG tablet Take 81 mg by mouth daily.    . Coenzyme Q10 150 MG CAPS Take 1 capsule by mouth daily.     . Multiple Vitamin (MULTIVITAMIN) tablet Take 1 tablet by mouth daily.    . Omega-3 Fatty Acids (FISH OIL) 1200 MG CAPS Take 1 capsule by mouth daily.     . pravastatin (PRAVACHOL) 40 MG tablet Take 40 mg by mouth daily.    . quinapril (ACCUPRIL) 20 MG tablet Take 40 mg by mouth daily.     Marland Kitchen UNABLE TO FIND Med Name: muscadine grape pills Take 1 capsule by mouth daily    . Potassium 99 MG TABS Take 1 tablet by mouth daily.    . cephALEXin (KEFLEX) 500 MG capsule Take 1 capsule (500 mg total) by mouth 3 (three) times daily. (Patient not taking: Reported on 09/23/2015) 20 capsule 0  . cyanocobalamin 500 MCG tablet Take 500 mcg by mouth daily. Reported on 0000000    . folic acid (FOLVITE) A999333 MCG tablet Take 400 mcg by mouth daily. Reported on 09/23/2015    . HYDROcodone-acetaminophen (NORCO) 7.5-325 MG tablet Take 1 tablet by mouth every 6 (six) hours as needed for moderate pain. (Patient not taking: Reported on 09/23/2015) 30 tablet 0   No facility-administered medications prior to visit.     Allergies:   Review of patient's allergies indicates no known allergies.   Past Medical History  Diagnosis Date  . Hyperlipidemia   . Hypertension   . Prostate cancer   . History of  shingles     2000    Past Surgical History  Procedure Laterality Date  . Prostate surgery    . Hernia repair    . Spinal cord stimulator insertion N/A 04/26/2015    Procedure: LUMBAR SPINAL CORD STIMULATOR INSERTION;  Surgeon: Clydell Hakim, MD;  Location: [facility];  Service: Neurosurgery;  Laterality: N/A;     Social History:  The patient  reports that he has never smoked. He does not have any smokeless tobacco history on file. He reports that he does not drink alcohol or use illicit drugs.   Family History:  The patient's family history includes Cancer in his mother; Heart attack in his brother and father; Multiple sclerosis in his brother; Other in his mother.    ROS:  Please see the history of present illness. All other systems are reviewed and  Negative to the above problem except as noted.    PHYSICAL EXAM: VS:  BP 130/80 mmHg  Pulse 80  Ht 5\' 10"  (1.778 m)  Wt 166 lb 12.8 oz (75.66 kg)  BMI 23.93 kg/m2  GEN: Well nourished, well developed, in no acute distress HEENT: normal Neck: no JVD, carotid  bruits, or masses Cardiac: RRR; Gr III/VI systolic murmur LSB to apex   rubs, or gallops,no edema  Respiratory:  clear to auscultation bilaterally, normal work of breathing GI: soft, nontender, nondistended, + BS  No hepatomegaly  MS: no deformity Moving all extremities   Skin: warm and dry, no rash Neuro:  Strength and sensation are intact Psych: euthymic mood, full affect   EKG:  EKG is ordered today. SR 80  With frequent PVCs  LBBB   Lipid Panel No results found for: CHOL, TRIG, HDL, CHOLHDL, VLDL, LDLCALC, LDLDIRECT    Wt Readings from Last 3 Encounters:  09/23/15 166 lb 12.8 oz (75.66 kg)  04/26/15 166 lb (75.297 kg)  04/23/15 166 lb 8 oz (75.524 kg)      ASSESSMENT AND PLAN:  1  HTN  Meds were reently increased BP is pretty good  Continue    2  Murmur  Aortic sclerosis on echo    3.  HL  Will get labs from Lavina Hamman' office    4  LBBB  Chronic        Disposition:   FU with me   in 1 year    Signed, Dorris Carnes, MD  09/23/2015 11:07 PM    [address], [place], [place]  [address] Phone: [phone]; Fax: [phone]

## 2015-09-26 DIAGNOSIS — B0229 Other postherpetic nervous system involvement: Secondary | ICD-10-CM | POA: Diagnosis not present

## 2015-10-04 ENCOUNTER — Other Ambulatory Visit (HOSPITAL_COMMUNITY): Payer: PPO

## 2015-10-17 ENCOUNTER — Other Ambulatory Visit (HOSPITAL_COMMUNITY): Payer: PPO

## 2015-10-31 ENCOUNTER — Ambulatory Visit (HOSPITAL_COMMUNITY): Payer: PPO | Attending: Cardiology

## 2015-10-31 ENCOUNTER — Other Ambulatory Visit: Payer: Self-pay

## 2015-10-31 DIAGNOSIS — I119 Hypertensive heart disease without heart failure: Secondary | ICD-10-CM | POA: Insufficient documentation

## 2015-10-31 DIAGNOSIS — R011 Cardiac murmur, unspecified: Secondary | ICD-10-CM | POA: Diagnosis not present

## 2015-10-31 DIAGNOSIS — I352 Nonrheumatic aortic (valve) stenosis with insufficiency: Secondary | ICD-10-CM | POA: Insufficient documentation

## 2015-10-31 DIAGNOSIS — E785 Hyperlipidemia, unspecified: Secondary | ICD-10-CM | POA: Insufficient documentation

## 2015-10-31 DIAGNOSIS — I340 Nonrheumatic mitral (valve) insufficiency: Secondary | ICD-10-CM | POA: Diagnosis not present

## 2015-11-08 ENCOUNTER — Telehealth: Payer: Self-pay | Admitting: Internal Medicine

## 2015-11-08 NOTE — Telephone Encounter (Signed)
Notified the pt of echo results per Dr Harrington Challenger, as mentioned below: Informed the pt that I will place a 2 year recall for him to see Dr Harrington Challenger then, and someone from our office will call him back closer to that date and send a letter, to have this appt schedule. Pt verbalized understanding and agrees with this plan.  2 year recall placed in the pts appt desk.    Notes Recorded by Fay Records, MD on 11/02/2015 at 4:40 PM Echo shows normal LV pumping function AV is very mildly narrowed Very mild Aortic Stenosis I would follow up in clinic in 2 years

## 2015-11-08 NOTE — Telephone Encounter (Signed)
Follow-up      The pt is returning a phone calling about Echogram results.

## 2015-11-14 DIAGNOSIS — B0229 Other postherpetic nervous system involvement: Secondary | ICD-10-CM | POA: Diagnosis not present

## 2015-12-18 DIAGNOSIS — C61 Malignant neoplasm of prostate: Secondary | ICD-10-CM | POA: Diagnosis not present

## 2015-12-26 DIAGNOSIS — I10 Essential (primary) hypertension: Secondary | ICD-10-CM | POA: Diagnosis not present

## 2015-12-26 DIAGNOSIS — B0229 Other postherpetic nervous system involvement: Secondary | ICD-10-CM | POA: Diagnosis not present

## 2016-01-06 DIAGNOSIS — H2513 Age-related nuclear cataract, bilateral: Secondary | ICD-10-CM | POA: Diagnosis not present

## 2016-01-28 DIAGNOSIS — B0229 Other postherpetic nervous system involvement: Secondary | ICD-10-CM | POA: Diagnosis not present

## 2016-02-06 DIAGNOSIS — B0229 Other postherpetic nervous system involvement: Secondary | ICD-10-CM | POA: Diagnosis not present

## 2016-02-06 DIAGNOSIS — L814 Other melanin hyperpigmentation: Secondary | ICD-10-CM | POA: Diagnosis not present

## 2016-02-06 DIAGNOSIS — I8391 Asymptomatic varicose veins of right lower extremity: Secondary | ICD-10-CM | POA: Diagnosis not present

## 2016-02-06 DIAGNOSIS — L821 Other seborrheic keratosis: Secondary | ICD-10-CM | POA: Diagnosis not present

## 2016-02-06 DIAGNOSIS — I8392 Asymptomatic varicose veins of left lower extremity: Secondary | ICD-10-CM | POA: Diagnosis not present

## 2016-02-06 DIAGNOSIS — D692 Other nonthrombocytopenic purpura: Secondary | ICD-10-CM | POA: Diagnosis not present

## 2016-02-13 IMAGING — RF DG THORACIC SPINE 2V
1 series · 1 of 1 positions shown · non-contrast
Comparison: None.

CLINICAL DATA: Spinal cord stimulator GI insertion

EXAM:
DG C-ARM 61-120 MIN; THORACIC SPINE 2 VIEWS

[Series 1: run · 1 of 1 slices shown]
[im 1/1]
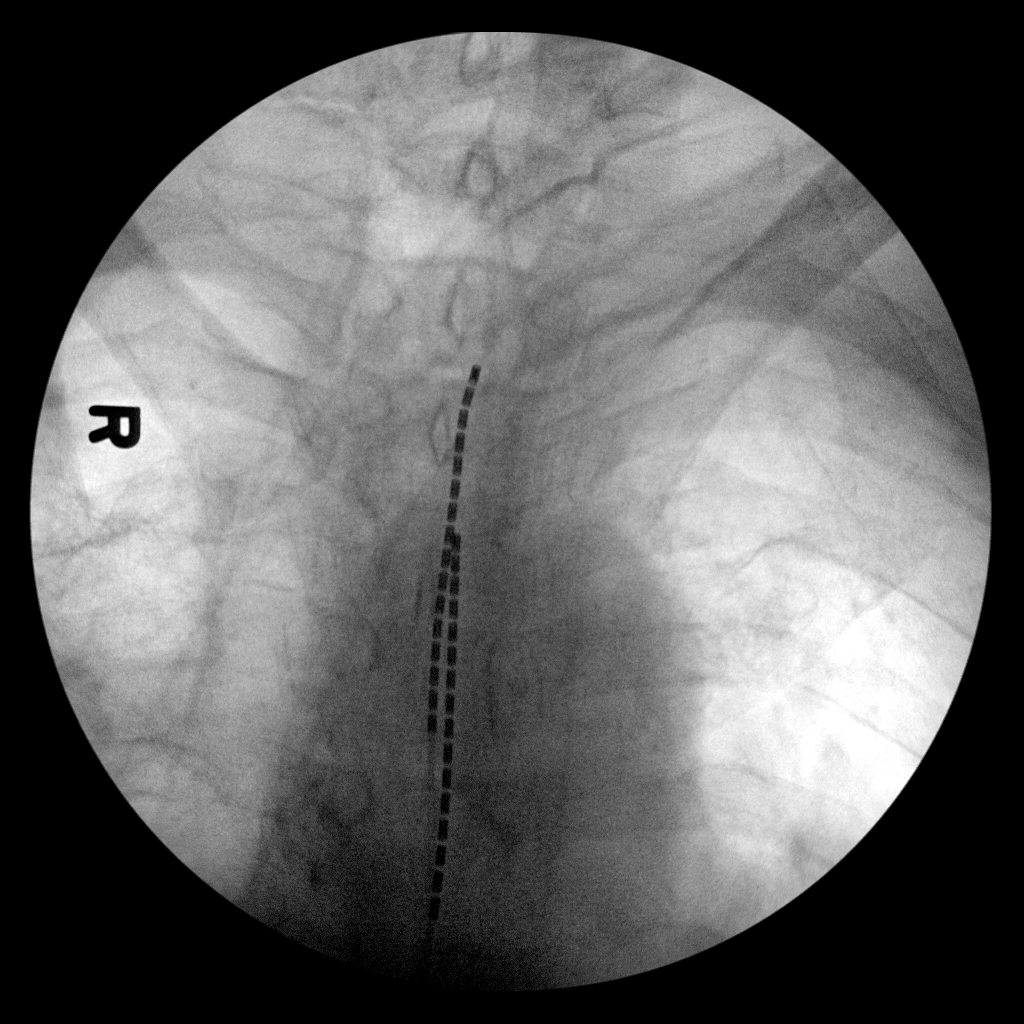

[1 of 1 positions shown; findings below may reference images not displayed]

FINDINGS: Single frontal view of thoracic spine submitted. 9spinal cord
stimulators wires are noted in mid thoracic spine. One is in with
tip about 1 cm above the level of aortic knob, the second with tip
at the level of aortic knob.
IMPRESSION: Spinal cord stimulator wires in mid/ upper thoracic spine.
Fluoroscopy time was 6 minutes 20 seconds. Please see the operative
report.

## 2016-02-25 DIAGNOSIS — B0229 Other postherpetic nervous system involvement: Secondary | ICD-10-CM | POA: Diagnosis not present

## 2016-03-11 DIAGNOSIS — Z23 Encounter for immunization: Secondary | ICD-10-CM | POA: Diagnosis not present

## 2016-03-19 ENCOUNTER — Other Ambulatory Visit: Payer: Self-pay

## 2016-03-19 DIAGNOSIS — Z8546 Personal history of malignant neoplasm of prostate: Secondary | ICD-10-CM | POA: Insufficient documentation

## 2016-03-19 DIAGNOSIS — G894 Chronic pain syndrome: Secondary | ICD-10-CM | POA: Insufficient documentation

## 2016-03-19 DIAGNOSIS — Z8619 Personal history of other infectious and parasitic diseases: Secondary | ICD-10-CM | POA: Insufficient documentation

## 2016-03-19 DIAGNOSIS — I10 Essential (primary) hypertension: Secondary | ICD-10-CM

## 2016-03-19 NOTE — Patient Outreach (Signed)
[facility]) Care Management  03/19/2016  Stephen Nunez 10/12/1931 [id number]   Telephonic Screening and Initial Assessment     Referral Date:  03/10/16 Source:  Self Referral [phone] Issue:  Prostate Cancer 2000, 2 meds, sees a neurologist for complications due to shingles but damaged nerve down the back of left side.  Pain management is not managed.  Acupuncture, Dr. Maryjean Ka doing injections to help tone it down.   Insurance:  HTA  Subjective: Outreach call #1 to patient.  Patient reached and completed call.  Patient states his biggest issue is dealing with the nerve pain from having shingles.    Providers: Primary MD.  Dr. Lona Kettle yearly and as needed.  Last appt 03/11/16 for flu vaccine.  Yearly 07/29/2015 and next appt 07/29/2016 Neurologist: Dr. Maryjean Ka Pain Clinic:  Dr. Maryjean Ka Cardiologist:  Dr. Dorris Carnes Opthalmologist:  yearly HH: none  Social: Patient lives in his home with his wife.  Mobility: ambulates with no assistive devices Falls: none Pain: yes  Depression: none Transportation: patient Caregiver: wife Emergency Contact: wife Advance Directive: yes Consent:  Yes:  Agreed to [facility] services DME: eyeglasses   Co-morbidities: HTN, Hyperlipidemia, Post Herpetic Neuralgia, Chronic Pain, Shingles (2000),Lumbar Spinal Cord Stimulator 04/26/15, murmur and LBBBChronic, History of Prostate Cancer,  Admissions: 0 ER visits: 0  HTN States BP is doing well.  States running in the 130s/80s and has not noted BP running any higher due to pain issues.  BP 130/80 Weight 167 lb Height: 70 inches.   Post herpetic neuralgia  Left shoulder down left side.   Patient has to wear a shirt or coat cut out from the shoulder to hip to avoid pain sensation.  Current pain management regime:  Injections with Dr. Maryjean Ka and Tramadol.  Patient uses distraction with reading. Patient states he has consulted with several doctors, read about shingles and  completed other treatments such as Acupuncture.  Shingles vaccine: yes  See Dr. Maryjean Ka note 04/25/2015 for h/o past interventions.   Prostate Cancer Released from MD care 2 years ago.    Medications:  Patient taking less than 10 medications Co-pay cost issues: none  Prevnar (PCV13) [facility] 12/05/2013 PCV23 N/D Flu Vaccine 03/11/2016 tDAP Vaccine 01/16/2011 Shingles Vaccine:  yes Medication Reconciliation completed with patient 03/19/2016   Encounter Medications:  Outpatient Encounter Prescriptions as of 03/19/2016  Medication Sig Note  . pravastatin (PRAVACHOL) 40 MG tablet Take 40 mg by mouth daily.   . quinapril (ACCUPRIL) 20 MG tablet Take 40 mg by mouth daily.  09/23/2015: Per patient this is a 40 MG daily dose  . vitamin E 400 UNIT capsule Take 400 Units by mouth daily.   Marland Kitchen aspirin 81 MG tablet Take 81 mg by mouth daily. 04/22/2015: On Hold  . Coenzyme Q10 150 MG CAPS Take 1 capsule by mouth daily.  04/22/2015: On Hold  . Multiple Vitamin (MULTIVITAMIN) tablet Take 1 tablet by mouth daily. 04/22/2015: On Hold  . Omega-3 Fatty Acids (FISH OIL) 1200 MG CAPS Take 1 capsule by mouth daily.  04/22/2015: On Hold  . UNABLE TO FIND Med Name: muscadine grape pills Take 1 capsule by mouth daily 04/22/2015: On Hold   No facility-administered encounter medications on file as of 03/19/2016.     Functional Status:  In your present state of health, do you have any difficulty performing the following activities: 03/19/2016 04/23/2015  Hearing? N N  Vision? N N  Difficulty concentrating or making decisions? N N  Walking or  climbing stairs? N N  Dressing or bathing? N N  Doing errands, shopping? N -  Preparing Food and eating ? N -  Using the Toilet? N -  In the past six months, have you accidently leaked urine? N -  Do you have problems with loss of bowel control? N -  Managing your Medications? N -  Managing your Finances? N -  Housekeeping or managing your Housekeeping? N -  Some recent  data might be hidden    Fall/Depression Screening: PHQ 2/9 Scores 03/19/2016 03/19/2016  PHQ - 2 Score 0 0    Fall Risk  03/19/2016  Falls in the past year? No    Preventives: Hearing: yearly checks with Primary and no issues found.  Eyes:  Yearly and wears eyeglasses  Dentist:  Yes every 6 months  Colonoscopy:  08/01/2015  Plan:  Referral 03/10/2016 Screening and Initial Assessment: 03/10/2016 Telephonic RN CM:  03/19/16 Program: Chronic Pain Management 03/19/16  Primary Care Physician Correction: RN sent request to [place] Management assistant and requested correction to Epic Primary Care Physician:   Incorrect:  Dr. Harle Battiest Correct:  Dr. Lona Kettle  Post Herpetic Neuralgia, Chronic Pain, Shingles (2000) [facility] Education Materials (mailed 03/19/16) -Shingles -Chronic Pain - Resources [place] Referral -Medication history review for past medications used or not used that may improve Post Herpetic Neuralgia chronic pain associated to shingles (2000).    HTN [facility] Education Materials (mailed 03/19/16) -Low-Salt Diet   RN CM advised in next [facility] scheduled contact call within next 30 days for monthly assessment and / or care coordination services as needed. RN CM advised to please notify MD of any changes in condition prior to scheduled appt's.   RN CM provided contact name and # [phone] or main office # [phone] and 24-hour nurse line # [phone].  RN CM confirmed patient is aware of 911 services for urgent emergency needs.  RN CM sent successful outreach letter and [facility] Introductory package. RN CM notified [place] Management Assistant: agreed to services/case opened. RN CM sent Physician Enrollment/Barriers Letter and Initial Assessment to Primary MD  Mariann Laster, MSHL, BSN, RN, [facility] Care Management Care Management Coordinator [phone] Direct [phone] Cell [phone] Office [phone]  Fax [email or web address]

## 2016-04-02 DIAGNOSIS — B0229 Other postherpetic nervous system involvement: Secondary | ICD-10-CM | POA: Diagnosis not present

## 2016-04-02 DIAGNOSIS — Z9689 Presence of other specified functional implants: Secondary | ICD-10-CM | POA: Diagnosis not present

## 2016-04-06 ENCOUNTER — Other Ambulatory Visit: Payer: Self-pay | Admitting: Anesthesiology

## 2016-04-16 ENCOUNTER — Ambulatory Visit: Payer: Self-pay

## 2016-04-17 ENCOUNTER — Other Ambulatory Visit: Payer: Self-pay

## 2016-04-17 ENCOUNTER — Ambulatory Visit: Payer: Self-pay

## 2016-04-17 ENCOUNTER — Ambulatory Visit: Payer: PPO

## 2016-04-17 NOTE — Patient Outreach (Signed)
[facility]) Care Management  04/17/2016  Stephen Nunez May 11, 1932 [id number]   Telephonic Monthly Assessment  Program: Pain Management  Referral Date:  03/10/16 Source:  Self Referral [phone] Issue:  Prostate Cancer 2000, 2 meds, sees a neurologist for complications due to shingles but damaged nerve down the back of left side.  Pain management is not managed.  Acupuncture, Dr. Maryjean Ka doing injections to help tone it down.   Insurance:  HTA  Subjective: Outreach call #1 to patient.  Patient reached and completed call.  Patient states he has just completed a series of injections with Dr. Maryjean Ka for management of  nerve pain associated to past shingles.    Providers: Primary MD.  Dr. Lona Kettle yearly and as needed.  Last appt 07/29/15 for yearly and 03/11/16 for flu vaccine / next appt 07/29/2016 Neurologist and Pain Management Clinic: Dr. Maryjean Ka  Cardiologist:  Dr. Dorris Carnes Opthalmologist:  yearly  Social: Patient lives in his home with his wife.  Mobility: ambulates with no assistive devices Falls: none Pain: yes  Depression: none Transportation: patient Caregiver: wife Emergency Contact: wife Advance Directive: yes Consent:  Yes:  Agreed to [facility] services DME: eyeglasses   Co-morbidities: HTN, Hyperlipidemia, Post Herpetic Neuralgia Chronic Pain associated to past Shingles (2000),Lumbar Spinal Cord Stimulator 04/26/15, murmur and LBBBChronic, History of Prostate Cancer (released by MD 2015),  Admissions: 0 ER visits: 0  HTN BP 130/80 03/19/2016 Weight 167 lb (76 kg) 03/19/2016 Height 70 in (178 cm) 03/19/2016 BMI 24.00 (Normal) 03/19/2016 Denies any HBP concerns this call.   Post Herpetic Neuralgia Chronic Pain associated to past Shingles (2000) Left shoulder down left side.   Patient has to wear a shirt or coat cut out from the shoulder to hip to avoid pain sensation.  Current pain management regime:  Injections with Dr. Maryjean Ka and  Tramadol.   Patient uses distraction with reading. Patient  has consulted with several doctors, read about shingles and completed other treatments such as Acupuncture.  Shingles vaccine: yes  See Dr. Maryjean Ka note 04/25/2015 for h/o past interventions.  Update:  Patient states he has just completed a series of injections with Dr. Maryjean Ka for management of  nerve pain associated to past shingles.   Patient is pleased with management he is receiving from Dr. Maryjean Ka.   Medications:  Patient taking less than 10 medications Co-pay cost issues: none  Prevnar (PCV13) [facility] 12/05/2013 PCV23 N/D Flu Vaccine 03/11/2016 tDAP Vaccine 01/16/2011 Shingles Vaccine:  yes Medication Reconciliation completed with patient 04/17/16    Encounter Medications:  Outpatient Encounter Prescriptions as of 04/17/2016  Medication Sig Note  . Multiple Vitamin (MULTIVITAMIN) tablet Take 1 tablet by mouth daily. 04/17/2016: Resumed.    . pravastatin (PRAVACHOL) 40 MG tablet Take 40 mg by mouth daily.   . quinapril (ACCUPRIL) 20 MG tablet Take 40 mg by mouth daily.  09/23/2015: Per patient this is a 40 MG daily dose  . vitamin E 400 UNIT capsule Take 400 Units by mouth daily.   Marland Kitchen aspirin 81 MG tablet Take 81 mg by mouth daily. 04/22/2015: On Hold  . Coenzyme Q10 150 MG CAPS Take 1 capsule by mouth daily.  04/22/2015: On Hold  . Omega-3 Fatty Acids (FISH OIL) 1200 MG CAPS Take 1 capsule by mouth daily.  04/22/2015: On Hold  . UNABLE TO FIND Med Name: muscadine grape pills Take 1 capsule by mouth daily 04/22/2015: On Hold   No facility-administered encounter medications on file as of 04/17/2016.  Functional Status:  In your present state of health, do you have any difficulty performing the following activities: 03/19/2016 04/23/2015  Hearing? N N  Vision? N N  Difficulty concentrating or making decisions? N N  Walking or climbing stairs? N N  Dressing or bathing? N N  Doing errands, shopping? N -  Preparing Food  and eating ? N -  Using the Toilet? N -  In the past six months, have you accidently leaked urine? N -  Do you have problems with loss of bowel control? N -  Managing your Medications? N -  Managing your Finances? N -  Housekeeping or managing your Housekeeping? N -  Some recent data might be hidden    Fall/Depression Screening: PHQ 2/9 Scores 04/17/2016 03/19/2016 03/19/2016  PHQ - 2 Score 0 0 0    Fall Risk  04/17/2016 03/19/2016  Falls in the past year? No No    Preventives: Hearing: yearly checks with Primary and no issues found.  Eyes:  Yearly and wears eyeglasses  Dentist:  Yes every 6 months  Colonoscopy:  08/01/2015  Plan:  Referral 03/10/2016 Screening and Initial Assessment: 03/10/2016 Telephonic RN CM:  03/19/16 Program: Chronic Pain Management 03/19/16  Post Herpetic Neuralgia, Chronic Pain, Shingles (2000) [facility] Education Materials (mailed 03/19/16 / Reviewed 04/17/16) -Shingles -Chronic Pain - Resources RN CM will continue to monitor clinical response to injections.  RN CM will continue to monitor any new education extended by pharmacy consult.   [facility] Referral 04/17/2016 -Medication history review for past medications used or not used for management of Post Herpetic Neuralgia chronic pain associated to shingles (2000).    HTN [facility] Education Materials (mailed 03/19/16 / Reviewed 04/17/16) -Low-Salt Diet   RN CM advised in next [facility] scheduled contact call within next 30 days for monthly assessment and / or care coordination services as needed. RN CM advised to please notify MD of any changes in condition prior to scheduled appt's.  RN CM provided contact name and # [phone] or main office # [phone] and 24-hour nurse line # [phone].  RN CM confirmed patient is aware of 911 services for urgent emergency needs.  Nathaneil Canary, BSN, RN, [facility] Care Management Care Management Coordinator [phone]  Direct [phone] Cell [phone] Office [phone] Fax [email or web address]

## 2016-04-23 ENCOUNTER — Other Ambulatory Visit: Payer: Self-pay

## 2016-04-23 DIAGNOSIS — B0229 Other postherpetic nervous system involvement: Secondary | ICD-10-CM | POA: Diagnosis not present

## 2016-04-23 NOTE — Patient Outreach (Signed)
04/23/16  Rollen Sox 1932-04-20 [id number]  Subjective: Mr. Sergi was referred to [place] by Nathaneil Canary, BSN, RN, CCM for medication history review. Specifically, the referral states to determine past medications used or not used that may improve chronic post-herpetic neuralgia chronic pain associated with shingles (2000). Patient is noted to have severe sensitivity and has to wear cut outs in his clothes to prevent the sensation of touch. Also, he has to sleep on his right side because it "disturbs the nerves" to sleep on his back or left side.   Quay Burow, a [employer], interviewed Mr. Yoshimura.  Mr. Loomis states that in 17 years, the injections every 6-8 weeks from Dr. Maryjean Ka is the only thing that provides marginal benefit to his pain. These injections started in January 2017 and requires him to take 1-2 tabs of tramadol for the first 3-4 weeks after each injection to recover from the pain of injection. He reports refusing to take more than 2 tabs of tramadol in a day. Mr. Sebring stated that these injections are likely steroids, but does not know the specific name. Before he started with these injections, Mr. Bridenbaugh described his pain as: some days was a 10, but the pain weans and wanes. Dr. Maryjean Ka is a neurosurgeon and he inserted a lumbar spinal cord stimulator in October 2016, which provided no benefit and is scheduled to be removed in November 2017.   Dr. Jabier Mutton is Mr. Shelton's PCP and has tried multiple medications throughout the years and Mr. Karstens cannot recall any of them since they did not work.  Upon talking further with Mr. Sherwin, he reports trying acupuncture at least 3 times by 3 different people to no benefit. He was referred to a chiropractor by Dr. Jabier Mutton, to no benefit. He reports trying heat therapy and cold therapy around the area because the specific area is too sensitive for direct use of these therapies.  Upon chart review, he has  tried nortriptyline, lidocaine ointment, lidocaine patch, norco 7.5-325, gabapentin, lyrica, capsaicin cream, duloxetine, epidural injections x3, and study trials. Nortriptyline specifically was discontinued due to hallucinatory side effects. Mr. Coburn reports trying anything until he knows it will not benefit him or until he experiences hallucinatory or unacceptable side effects. I prompted him about the length of time to look for benefit and he stated that he would use the entire prescription (a months' worth of medication, or until he was out of refills) before calling Dr. Jabier Mutton for advice and trying a new medication.   Objective:  Outpatient Encounter Prescriptions as of 04/23/2016  Medication Sig Note  . aspirin 81 MG tablet Take 81 mg by mouth daily. 04/22/2015: On Hold  . Coenzyme Q10 150 MG CAPS Take 1 capsule by mouth daily.  04/22/2015: On Hold  . Multiple Vitamin (MULTIVITAMIN) tablet Take 1 tablet by mouth daily. 04/17/2016: Resumed.    . Omega-3 Fatty Acids (FISH OIL) 1200 MG CAPS Take 1 capsule by mouth daily.  04/22/2015: On Hold  . pravastatin (PRAVACHOL) 40 MG tablet Take 40 mg by mouth daily.   . quinapril (ACCUPRIL) 20 MG tablet Take 40 mg by mouth daily.  09/23/2015: Per patient this is a 40 MG daily dose  . UNABLE TO FIND Med Name: muscadine grape pills Take 1 capsule by mouth daily 04/22/2015: On Hold  . vitamin E 400 UNIT capsule Take 400 Units by mouth daily.    No facility-administered encounter medications on file as of 04/23/2016.  Assessment: Current medications sorted by system:  Cardiovascular: Aspirin 81 mg, Pravastatin, Quinapril  Topical: Aloe vera gel   Pain: Tramadol, Acetaminophen   Miscellaneous: Multi-vitamin, Coenzyme Q10, Omega-3 fatty acids, Vitamin E  Treatment options:  Mr. Jewart has used all of the first line treatments and second line treatments that the American Neurologic Association Clinical Guidelines recommend for the treatment of  posthepatic neuralgia.     Plan: 1. Mr. Basciano has tried all the approved evidence based treatments I would recommend he try.  I have research alternative options and have not found any other that have evidence to support using them.   2. I would recommend he continue his current treatment regimen.   3. I am going to close his case to pharmacy since all his pharmacy issues have been resolved.  I am happy to assist if other pharmacy issues arise.   Deanne Coffer, PharmD, [place] Assistant Clinical Director of [place]  [phone]

## 2016-05-08 ENCOUNTER — Encounter (HOSPITAL_COMMUNITY)
Admission: RE | Admit: 2016-05-08 | Discharge: 2016-05-08 | Disposition: A | Discharge status: Home / Self Care | Payer: PPO | Source: Ambulatory Visit | Attending: Anesthesiology | Admitting: Anesthesiology

## 2016-05-08 ENCOUNTER — Encounter (HOSPITAL_COMMUNITY): Payer: Self-pay

## 2016-05-08 ENCOUNTER — Other Ambulatory Visit (HOSPITAL_COMMUNITY): Payer: Self-pay | Admitting: *Deleted

## 2016-05-08 DIAGNOSIS — Z01812 Encounter for preprocedural laboratory examination: Secondary | ICD-10-CM | POA: Insufficient documentation

## 2016-05-08 HISTORY — DX: Unspecified osteoarthritis, unspecified site: M19.90

## 2016-05-08 HISTORY — DX: Other postherpetic nervous system involvement: B02.29

## 2016-05-08 HISTORY — DX: Pneumonia, unspecified organism: J18.9

## 2016-05-08 LAB — BASIC METABOLIC PANEL
Anion gap: 8 (ref 5–15)
BUN: 21 mg/dL — ABNORMAL HIGH (ref 6–20)
CO2: 27 mmol/L (ref 22–32)
Calcium: 9.2 mg/dL (ref 8.9–10.3)
Chloride: 103 mmol/L (ref 101–111)
Creatinine, Ser: 1.26 mg/dL — ABNORMAL HIGH (ref 0.61–1.24)
GFR calc Af Amer: 60 mL/min — ABNORMAL LOW (ref >60)
GFR calc non Af Amer: 52 mL/min — ABNORMAL LOW (ref >60)
Glucose, Bld: 91 mg/dL (ref 65–99)
Potassium: 4.1 mmol/L (ref 3.5–5.1)
Sodium: 138 mmol/L (ref 135–145)

## 2016-05-08 LAB — CBC
HCT: 39.4 % (ref 39.0–52.0)
Hemoglobin: 12.9 g/dL — ABNORMAL LOW (ref 13.0–17.0)
MCH: 33.5 pg (ref 26.0–34.0)
MCHC: 32.7 g/dL (ref 30.0–36.0)
MCV: 102.3 fL — ABNORMAL HIGH (ref 78.0–100.0)
Platelets: 183 10*3/uL (ref 150–400)
RBC: 3.85 MIL/uL — ABNORMAL LOW (ref 4.22–5.81)
RDW: 13.5 % (ref 11.5–15.5)
WBC: 5.7 10*3/uL (ref 4.0–10.5)

## 2016-05-08 LAB — PROTIME-INR
INR: 1.01
Prothrombin Time: 13.3 seconds (ref 11.4–15.2)

## 2016-05-08 LAB — SURGICAL PCR SCREEN
MRSA, PCR: NEGATIVE
Staphylococcus aureus: NEGATIVE

## 2016-05-08 LAB — APTT: aPTT: 31 seconds (ref 24–36)

## 2016-05-08 NOTE — Pre-Procedure Instructions (Signed)
Stephen Nunez  05/08/2016    Your procedure is scheduled on Friday, May 15, 2016 at 7:30 AM.   Report to [facility] Entrance "A" Admitting Office at 5:30 AM.   Call this number if you have problems the morning of surgery: [phone]   Questions prior to day of surgery, please call [phone] between 8 & 4 PM.    Remember:  Do not eat food or drink liquids after midnight Thursday, 05/14/16.  Take these medicines the morning of surgery with A SIP OF WATER: Valcyclovir (Valtrex), Tramadol - if needed  Stop Aspirin, Fish Oil and Vitamins (E and Multi) as of today. Do not use NSAIDS (Ibuprofen, Aleve, etc.) prior to surgery.   Do not wear jewelry.  Do not wear lotions, powders, or perfumes.  Men may shave face and neck.  Do not bring valuables to the hospital.  [facility] is not responsible for any belongings or valuables.  Contacts, dentures or bridgework may not be worn into surgery.  Leave your suitcase in the car.  After surgery it may be brought to your room.  For patients admitted to the hospital, discharge time will be determined by your treatment team.  Patients discharged the day of surgery will not be allowed to drive home.   Special instructions:  [facility] - Preparing for Surgery  Before surgery, you can play an important role.  Because skin is not sterile, your skin needs to be as free of germs as possible.  You can reduce the number of germs on you skin by washing with CHG (chlorahexidine gluconate) soap before surgery.  CHG is an antiseptic cleaner which kills germs and bonds with the skin to continue killing germs even after washing.  Please DO NOT use if you have an allergy to CHG or antibacterial soaps.  If your skin becomes reddened/irritated stop using the CHG and inform your nurse when you arrive at Short Stay.  Do not shave (including legs and underarms) for at least 48 hours prior to the first CHG shower.  You may shave your  face.  Please follow these instructions carefully:   1.  Shower with CHG Soap the night before surgery and the                    morning of Surgery.  2.  If you choose to wash your hair, wash your hair first as usual with your       normal shampoo.  3.  After you shampoo, rinse your hair and body thoroughly to remove the shampoo.  4.  Use CHG as you would any other liquid soap.  You can apply chg directly       to the skin and wash gently with scrungie or a clean washcloth.  5.  Apply the CHG Soap to your body ONLY FROM THE NECK DOWN.        Do not use on open wounds or open sores.  Avoid contact with your eyes, ears, mouth and genitals (private parts).  Wash genitals (private parts) with your normal soap.  6.  Wash thoroughly, paying special attention to the area where your surgery        will be performed.  7.  Thoroughly rinse your body with warm water from the neck down.  8.  DO NOT shower/wash with your normal soap after using and rinsing off       the CHG Soap.  9.  Pat yourself  dry with a clean towel.            10.  Wear clean pajamas.            11.  Place clean sheets on your bed the night of your first shower and do not        sleep with pets.  Day of Surgery  Do not apply any lotions the morning of surgery.  Please wear clean clothes to the hospital.   Please read over the following fact sheets that you were given.

## 2016-05-08 NOTE — Progress Notes (Signed)
   05/08/16 1024  OBSTRUCTIVE SLEEP APNEA  Have you ever been diagnosed with sleep apnea through a sleep study? No  Do you snore loudly (loud enough to be heard through closed doors)?  1  Do you often feel tired, fatigued, or sleepy during the daytime (such as falling asleep during driving or talking to someone)? 0  Has anyone observed you stop breathing during your sleep? 1  Do you have, or are you being treated for high blood pressure? 1  BMI more than 35 kg/m2? 0  Age > 50 (1-yes) 1  Neck circumference greater than:Male 16 inches or larger, Male 17inches or larger? 0  Male Gender (Yes=1) 1  Obstructive Sleep Apnea Score 5  Score 5 or greater  Results sent to PCP

## 2016-05-08 NOTE — Progress Notes (Signed)
Pt sees Dr. Dorris Carnes for a heart murmur and LBBB. Last office visit was 08/2015. Pt denies chest pain or sob

## 2016-05-14 NOTE — H&P (Signed)
Stephen Nunez is an 82 y.o. male.   Chief Complaint: Postherpetic neuralgia HPI: Exceedingly pleasant 82 year old gentleman with a nearly 20 year history of postherpetic neuralgia.  He been seen by numerous specialty physicians, undergone a variety of procedures, and medication trials to treat his postherpetic neuralgia all without success.  He was referred to me for consideration of either high concentration capsaicin treatment and/or SCS.  Given the patient's previous experience with low-dose capsaicin, which was miserable, we elected a trial SCS, and the patient did exceptionally well.  It was noteworthy that he actually was able toward normal" for the first time in many years.  He went on to permanent implantation, with equal success for the first few weeks of placement, but subsequently has had failure of therapy.  Numerous efforts have been made at reprogramming, all of which seemed to actually increase the patient's sensitivity and pain rather than decreasing it. He is requesting removal of the device  Past Medical History:  Diagnosis Date  . Arthritis   . Heart murmur   . History of shingles    2000  . Hyperlipidemia   . Hypertension   . Pneumonia    as an infant  . Post herpetic neuralgia   . Prostate cancer    prostate cancer    Past Surgical History:  Procedure Laterality Date  . COLONOSCOPY    . HERNIA REPAIR    . PROSTATE SURGERY    . SPINAL CORD STIMULATOR INSERTION N/A 04/26/2015   Procedure: LUMBAR SPINAL CORD STIMULATOR INSERTION;  Surgeon: Clydell Hakim, MD;  Location: [place] [facility];  Service: Neurosurgery;  Laterality: N/A;  . TONSILLECTOMY      Family History  Problem Relation Age of Onset  . Heart attack Father   . Other Mother     Post-op complications, deceased 52  . Cancer Mother   . Heart attack Brother     Deceased 84  . Multiple sclerosis Brother     Deceased, 62   Social History:  reports that he has never smoked. He has never used smokeless  tobacco. He reports that he does not drink alcohol or use drugs.  Allergies:  Allergies  Allergen Reactions  . No Known Allergies     Medications Prior to Admission  Medication Sig Dispense Refill  . aspirin 81 MG tablet Take 81 mg by mouth daily.    . Coenzyme Q10 150 MG CAPS Take 1 capsule by mouth daily.     . Multiple Vitamin (MULTIVITAMIN) tablet Take 1 tablet by mouth daily.    . Omega-3 Fatty Acids (FISH OIL) 1200 MG CAPS Take 1 capsule by mouth daily.     . pravastatin (PRAVACHOL) 40 MG tablet Take 40 mg by mouth daily.    . quinapril (ACCUPRIL) 20 MG tablet Take 40 mg by mouth daily.     . traMADol (ULTRAM) 50 MG tablet Take 1-2 tablets by mouth daily as needed for moderate pain.     Marland Kitchen UNABLE TO FIND Med Name: muscadine grape pills Take 1 capsule by mouth daily    . valACYclovir (VALTREX) 500 MG tablet Take 1 tablet by mouth 2 (two) times daily.    . vitamin E 400 UNIT capsule Take 400 Units by mouth daily.      No results found for this or any previous visit (from the past 48 hour(s)). No results found.  Review of Systems  Constitutional: Negative.   HENT: Negative.   Eyes: Negative.   Respiratory: Negative.  Cardiovascular: Negative.   Gastrointestinal: Negative.   Genitourinary: Negative.   Musculoskeletal: Negative.   Skin: Negative.   Neurological: Negative.   Endo/Heme/Allergies: Negative.   Psychiatric/Behavioral: Negative.     Blood pressure (!) 171/78, pulse 64, temperature 97.8 F (36.6 C), resp. rate 18, SpO2 98 %. Physical Exam  Constitutional: He is oriented to person, place, and time. He appears well-developed and well-nourished.  HENT:  Head: Normocephalic and atraumatic.  Eyes: EOM are normal. Pupils are equal, round, and reactive to light.  Neck: Normal range of motion.  Cardiovascular: Normal rate.   Musculoskeletal: Normal range of motion.  Neurological: He is alert and oriented to person, place, and time.  Skin: Skin is warm and dry.   Psychiatric: He has a normal mood and affect. His behavior is normal. Judgment and thought content normal.     Assessment/Plan A: Postherpeticneuralgia PLAN: SCS removal  Bonna Gains, MD 05/15/2016, 7:40 AM

## 2016-05-15 ENCOUNTER — Ambulatory Visit (HOSPITAL_COMMUNITY)
Admission: RE | Admit: 2016-05-15 | Discharge: 2016-05-15 | Disposition: A | Discharge status: Home / Self Care | Payer: PPO | Source: Ambulatory Visit | Attending: Anesthesiology | Admitting: Anesthesiology

## 2016-05-15 ENCOUNTER — Ambulatory Visit (HOSPITAL_COMMUNITY): Payer: PPO | Admitting: Certified Registered Nurse Anesthetist

## 2016-05-15 ENCOUNTER — Ambulatory Visit: Payer: Self-pay

## 2016-05-15 ENCOUNTER — Encounter (HOSPITAL_COMMUNITY)
Admission: RE | Disposition: A | Discharge status: Home / Self Care | Payer: Self-pay | Source: Ambulatory Visit | Attending: Anesthesiology

## 2016-05-15 ENCOUNTER — Encounter (HOSPITAL_COMMUNITY): Payer: Self-pay | Admitting: Certified Registered Nurse Anesthetist

## 2016-05-15 DIAGNOSIS — E785 Hyperlipidemia, unspecified: Secondary | ICD-10-CM | POA: Diagnosis not present

## 2016-05-15 DIAGNOSIS — G8929 Other chronic pain: Secondary | ICD-10-CM | POA: Diagnosis not present

## 2016-05-15 DIAGNOSIS — M1990 Unspecified osteoarthritis, unspecified site: Secondary | ICD-10-CM | POA: Diagnosis not present

## 2016-05-15 DIAGNOSIS — Z8546 Personal history of malignant neoplasm of prostate: Secondary | ICD-10-CM | POA: Diagnosis not present

## 2016-05-15 DIAGNOSIS — Z4549 Encounter for adjustment and management of other implanted nervous system device: Secondary | ICD-10-CM | POA: Diagnosis not present

## 2016-05-15 DIAGNOSIS — B0229 Other postherpetic nervous system involvement: Secondary | ICD-10-CM | POA: Insufficient documentation

## 2016-05-15 DIAGNOSIS — Z79899 Other long term (current) drug therapy: Secondary | ICD-10-CM | POA: Diagnosis not present

## 2016-05-15 DIAGNOSIS — I10 Essential (primary) hypertension: Secondary | ICD-10-CM | POA: Diagnosis not present

## 2016-05-15 DIAGNOSIS — Z9689 Presence of other specified functional implants: Secondary | ICD-10-CM | POA: Diagnosis not present

## 2016-05-15 DIAGNOSIS — Z7982 Long term (current) use of aspirin: Secondary | ICD-10-CM | POA: Insufficient documentation

## 2016-05-15 DIAGNOSIS — G894 Chronic pain syndrome: Secondary | ICD-10-CM | POA: Diagnosis not present

## 2016-05-15 HISTORY — PX: SPINAL CORD STIMULATOR REMOVAL: SHX5379

## 2016-05-15 SURGERY — LUMBAR SPINAL CORD STIMULATOR REMOVAL
Anesthesia: Monitor Anesthesia Care | Site: Spine Thoracic

## 2016-05-15 MED ORDER — BACITRACIN ZINC 500 UNIT/GM EX OINT
TOPICAL_OINTMENT | CUTANEOUS | Status: AC
  Filled 2016-05-15: qty 28.35

## 2016-05-15 MED ORDER — OXYCODONE HCL 5 MG PO TABS: 5.0000 mg | ORAL_TABLET | Freq: Once | ORAL | Status: DC | PRN

## 2016-05-15 MED ORDER — HYDROCODONE-ACETAMINOPHEN 10-325 MG PO TABS: 1.0000 | ORAL_TABLET | ORAL | 0 refills | Status: DC | PRN

## 2016-05-15 MED ORDER — CHLORHEXIDINE GLUCONATE CLOTH 2 % EX PADS: 6.0000 | MEDICATED_PAD | Freq: Once | CUTANEOUS | Status: DC

## 2016-05-15 MED ORDER — CEFAZOLIN SODIUM-DEXTROSE 2-4 GM/100ML-% IV SOLN
2.0000 g | INTRAVENOUS | Status: AC
  Administered 2016-05-15: 2 g via INTRAVENOUS
  Filled 2016-05-15: qty 100

## 2016-05-15 MED ORDER — FENTANYL CITRATE (PF) 100 MCG/2ML IJ SOLN
INTRAMUSCULAR | Status: DC | PRN
  Administered 2016-05-15 (×2): 25 ug via INTRAVENOUS

## 2016-05-15 MED ORDER — FENTANYL CITRATE (PF) 100 MCG/2ML IJ SOLN
INTRAMUSCULAR | Status: AC
  Filled 2016-05-15: qty 2

## 2016-05-15 MED ORDER — PROPOFOL 500 MG/50ML IV EMUL
INTRAVENOUS | Status: DC | PRN
  Administered 2016-05-15: 75 ug/kg/min via INTRAVENOUS

## 2016-05-15 MED ORDER — ONDANSETRON HCL 4 MG/2ML IJ SOLN
INTRAMUSCULAR | Status: AC
  Filled 2016-05-15: qty 2

## 2016-05-15 MED ORDER — LIDOCAINE 2% (20 MG/ML) 5 ML SYRINGE
INTRAMUSCULAR | Status: AC
  Filled 2016-05-15: qty 5

## 2016-05-15 MED ORDER — LACTATED RINGERS IV SOLN
INTRAVENOUS | Status: DC | PRN
  Administered 2016-05-15: 07:00:00 via INTRAVENOUS

## 2016-05-15 MED ORDER — ONDANSETRON HCL 4 MG/2ML IJ SOLN
INTRAMUSCULAR | Status: DC | PRN
  Administered 2016-05-15: 4 mg via INTRAVENOUS

## 2016-05-15 MED ORDER — LIDOCAINE-EPINEPHRINE 1 %-1:100000 IJ SOLN
INTRAMUSCULAR | Status: DC | PRN
  Administered 2016-05-15: 30 mL via INTRADERMAL

## 2016-05-15 MED ORDER — FENTANYL CITRATE (PF) 100 MCG/2ML IJ SOLN: 25.0000 ug | INTRAMUSCULAR | Status: DC | PRN

## 2016-05-15 MED ORDER — ROCURONIUM BROMIDE 10 MG/ML (PF) SYRINGE
PREFILLED_SYRINGE | INTRAVENOUS | Status: AC
  Filled 2016-05-15: qty 10

## 2016-05-15 MED ORDER — LIDOCAINE-EPINEPHRINE 1 %-1:100000 IJ SOLN
INTRAMUSCULAR | Status: AC
  Filled 2016-05-15: qty 1

## 2016-05-15 MED ORDER — OXYCODONE HCL 5 MG/5ML PO SOLN: 5.0000 mg | Freq: Once | ORAL | Status: DC | PRN

## 2016-05-15 MED ORDER — PROPOFOL 10 MG/ML IV BOLUS
INTRAVENOUS | Status: AC
  Filled 2016-05-15: qty 20

## 2016-05-15 SURGICAL SUPPLY — 34 items
BLADE CLIPPER SURG (BLADE) IMPLANT
CHLORAPREP W/TINT 26ML (MISCELLANEOUS) ×2 IMPLANT
DERMABOND ADVANCED (GAUZE/BANDAGES/DRESSINGS) ×1
DERMABOND ADVANCED .7 DNX12 (GAUZE/BANDAGES/DRESSINGS) ×1 IMPLANT
DRAPE LAPAROTOMY 100X72X124 (DRAPES) ×2 IMPLANT
DRSG OPSITE POSTOP 3X4 (GAUZE/BANDAGES/DRESSINGS) ×4 IMPLANT
ELECT REM PT RETURN 9FT ADLT (ELECTROSURGICAL) ×2
ELECTRODE REM PT RTRN 9FT ADLT (ELECTROSURGICAL) ×1 IMPLANT
GLOVE BIO SURGEON STRL SZ7 (GLOVE) ×2 IMPLANT
GLOVE BIOGEL PI IND STRL 7.5 (GLOVE) ×2 IMPLANT
GLOVE BIOGEL PI INDICATOR 7.5 (GLOVE) ×2
GLOVE ECLIPSE 7.5 STRL STRAW (GLOVE) ×2 IMPLANT
GLOVE EXAM NITRILE LRG STRL (GLOVE) IMPLANT
GLOVE EXAM NITRILE XL STR (GLOVE) IMPLANT
GLOVE EXAM NITRILE XS STR PU (GLOVE) IMPLANT
GOWN STRL REUS W/ TWL LRG LVL3 (GOWN DISPOSABLE) ×2 IMPLANT
GOWN STRL REUS W/TWL LRG LVL3 (GOWN DISPOSABLE) ×2
KIT BASIN OR (CUSTOM PROCEDURE TRAY) ×2 IMPLANT
KIT ROOM TURNOVER OR (KITS) ×2 IMPLANT
NEEDLE HYPO 25X1 1.5 SAFETY (NEEDLE) ×2 IMPLANT
NS IRRIG 1000ML POUR BTL (IV SOLUTION) ×2 IMPLANT
PACK LAMINECTOMY NEURO (CUSTOM PROCEDURE TRAY) ×2 IMPLANT
PAD ARMBOARD 7.5X6 YLW CONV (MISCELLANEOUS) ×2 IMPLANT
SPONGE LAP 4X18 X RAY DECT (DISPOSABLE) IMPLANT
STAPLER SKIN PROX WIDE 3.9 (STAPLE) ×2 IMPLANT
SUT MNCRL AB 4-0 PS2 18 (SUTURE) ×2 IMPLANT
SUT SILK 2 0 FS (SUTURE) IMPLANT
SUT VIC AB 2-0 CP2 18 (SUTURE) ×4 IMPLANT
SYR EPIDURAL 5ML GLASS (SYRINGE) IMPLANT
SYRINGE 10CC LL (SYRINGE) IMPLANT
TOWEL OR 17X24 6PK STRL BLUE (TOWEL DISPOSABLE) ×2 IMPLANT
TOWEL OR 17X26 10 PK STRL BLUE (TOWEL DISPOSABLE) ×2 IMPLANT
WATER STERILE IRR 1000ML POUR (IV SOLUTION) ×2 IMPLANT
YANKAUER SUCT BULB TIP NO VENT (SUCTIONS) ×2 IMPLANT

## 2016-05-15 NOTE — Anesthesia Procedure Notes (Signed)
Procedure Name: MAC Date/Time: 05/15/2016 7:50 AM Performed by: Garrison Columbus T Pre-anesthesia Checklist: Patient identified, Emergency Drugs available, Suction available and Patient being monitored Patient Re-evaluated:Patient Re-evaluated prior to inductionOxygen Delivery Method: Simple face mask Preoxygenation: Pre-oxygenation with 100% oxygen Intubation Type: IV induction Placement Confirmation: positive ETCO2 and breath sounds checked- equal and bilateral Dental Injury: Teeth and Oropharynx as per pre-operative assessment

## 2016-05-15 NOTE — Transfer of Care (Signed)
Immediate Anesthesia Transfer of Care Note  Patient: Stephen Nunez  Procedure(s) Performed: Procedure(s) with comments: THORACIC SPINAL CORD STIMULATOR REMOVAL (N/A) - THORACIC SPINAL CORD STIMULATOR REMOVAL  Patient Location: PACU  Anesthesia Type:MAC  Level of Consciousness: awake, alert  and oriented  Airway & Oxygen Therapy: Patient Spontanous Breathing  Post-op Assessment: Report given to RN, Post -op Vital signs reviewed and stable and Patient moving all extremities X 4  Post vital signs: Reviewed and stable  Last Vitals:  Vitals:   05/15/16 0557 05/15/16 0601  BP: (!) 171/78   Pulse: 64   Resp: 18   Temp:  36.6 C    Last Pain: There were no vitals filed for this visit.       Complications: No apparent anesthesia complications

## 2016-05-15 NOTE — Discharge Instructions (Addendum)

## 2016-05-15 NOTE — Anesthesia Postprocedure Evaluation (Signed)
Anesthesia Post Note  Patient: Stephen Nunez  Procedure(s) Performed: Procedure(s) (LRB): THORACIC SPINAL CORD STIMULATOR REMOVAL (N/A)  Patient location during evaluation: PACU Anesthesia Type: MAC Level of consciousness: awake Pain management: pain level controlled Vital Signs Assessment: post-procedure vital signs reviewed and stable Respiratory status: spontaneous breathing Cardiovascular status: stable Postop Assessment: no signs of nausea or vomiting Anesthetic complications: no     Last Vitals:  Vitals:   05/15/16 0900 05/15/16 0905  BP: 129/63 (!) 154/70  Pulse: 62 63  Resp: 20 20  Temp: 36.5 C     Last Pain:  Vitals:   05/15/16 0905  PainSc: 0-No pain   Pain Goal:                 [name]

## 2016-05-15 NOTE — Anesthesia Preprocedure Evaluation (Addendum)
Anesthesia Evaluation  Patient identified by MRN, date of birth, ID band Patient awake    Reviewed: Allergy & Precautions, NPO status , Patient's Chart, lab work & pertinent test results  History of Anesthesia Complications Negative for: history of anesthetic complications  Airway Mallampati: II  TM Distance: >3 FB Neck ROM: Full    Dental  (+) Dental Advisory Given, Teeth Intact   Pulmonary    breath sounds clear to auscultation       Cardiovascular hypertension, Pt. on medications  Rhythm:Regular     Neuro/Psych  Neuromuscular disease negative psych ROS   GI/Hepatic negative GI ROS, Neg liver ROS,   Endo/Other  negative endocrine ROS  Renal/GU negative Renal ROS     Musculoskeletal  (+) Arthritis ,   Abdominal   Peds  Hematology negative hematology ROS (+)   Anesthesia Other Findings   Reproductive/Obstetrics                           Anesthesia Physical Anesthesia Plan  ASA: II  Anesthesia Plan: MAC   Post-op Pain Management:    Induction: Intravenous  Airway Management Planned: Natural Airway and Simple Face Mask  Additional Equipment: None  Intra-op Plan:   Post-operative Plan:   Informed Consent: I have reviewed the patients History and Physical, chart, labs and discussed the procedure including the risks, benefits and alternatives for the proposed anesthesia with the patient or authorized representative who has indicated his/her understanding and acceptance.   Dental advisory given  Plan Discussed with: CRNA, Anesthesiologist and Surgeon  Anesthesia Plan Comments:        Anesthesia Quick Evaluation

## 2016-05-15 NOTE — Op Note (Signed)
1) postherpeticneuralgia 2)  chronic pain  POSTOP DX: same as preop PROCEDURES PERFORMED:removal of SCS system SURGEON:Sadira Standard  ASSISTANT: NONE  ANESTHESIA: TIVA EBL: <10cc  DESCRIPTION OF PROCEDURE: After a discussion of risks, benefits and alternatives, informed consent was obtained. The patient was taken to the OR, an adequate plane of anesthesia induced by the anesthesia team without difficulty, turned prone onto a Jackson table, all pressure points padded, SCD's placed. A timeout was taken to verify the correct patient, position, personnel, availability of appropriate equipment, and administration of perioperative antibiotics.   The previous incision areas were widely prepped with chloraprep and draped into a sterile field. The skin and subcutaneous tissues around the patient's previous pocket incision was infiltrated with 0.25% lidocaine with 1:100K epinephrine. The subcutaneous pocket was incised with a 10 blade and using sharp, careful dissection the pocket opened and the IPG delivered onto the field. The pocket was inspected for hemostasis, which was found to be excellent. The previous incision where the leads were placed was incised after infiltration of the skin and subcutaneous tissues with local anesthetic. The lead anchors were dissected free and the leads withdrawn in toto.  The incisions were copiously irrigated with bacitracin-containing irrigation.The pocket incision was closed with a deeper layer of 2-0 vicryl interrupted sutures, to close the pocket, the dermal layer closed with interrupted 2-0 vicryl and the skin closed with staples. The lumbar incision was closed with 2 layers of interrupted 2-0 vicryl and the skin closed with staples . Sterile dressings were applied. Needle, sponge, and instrument counts were correct x2 at the end of the case.  The patient was then carefully awakened from anesthesia after being turned supine and the patient taken to the recovery  room. COMPLICATIONS: NONE  CONDITION: Stable throughout the course of the procedure and immediately afterward  DISPOSITION: discharge to home. Discussed care with the patient and family member. Followup in clinic will be scheduled in 10-14 days.

## 2016-05-15 NOTE — Progress Notes (Signed)
Orthopedic Tech Progress Note Patient Details:  Stephen Nunez 12/12/1931 [id number]  Ortho Devices Type of Ortho Device: Abdominal binder Ortho Device/Splint Location: abdomen Ortho Device/Splint Interventions: Loanne Drilling, Trishna Cwik 05/15/2016, 9:27 AM

## 2016-05-18 ENCOUNTER — Other Ambulatory Visit: Payer: Self-pay

## 2016-05-18 ENCOUNTER — Encounter (HOSPITAL_COMMUNITY): Payer: Self-pay | Admitting: Anesthesiology

## 2016-05-18 NOTE — Patient Outreach (Signed)
[facility]) Care Management  05/18/2016  Stephen Nunez September 15, 1931 [id number]   Telephonic Monthly Assessment  Insurance:  HTA  Subjective: Outreach call #1 to patient.  Patient reached and completed call.  States had Thoracic Spinal Cord Stimulator  removed last week due to no improvement after one year.  States doing ok.    Providers: Primary MD.  Dr. Gildardo Cranker yearly and as needed.  Last appt 07/29/15 for yearly and 03/11/16 for flu vaccine / next appt 07/29/2016 Neurologist and Pain Management Clinic: Dr. Ollen Bowl  Cardiologist:  Dr. Dietrich Pates Opthalmologist:  yearly  Social: Patient lives in his home with his wife.  Mobility: ambulates with no assistive devices Falls: none Pain: yes  Depression: none Transportation: patient Caregiver: wife Emergency Contact: wife Advance Directive: yes DME: eyeglasses   Co-morbidities: HTN, Hyperlipidemia, Post Herpetic Neuralgia Chronic Pain associated to past Shingles (2000),Lumbar Spinal Cord Stimulator 04/26/15, murmur and LBBBChronic, History of Prostate Cancer (released by MD 2015),  Admissions: 0 ER visits: 0  HTN BP 146/73 05/08/2016 Weight 166 lb (75 kg) 05/08/2016 Height 68 in (171 cm) 05/08/2016 BMI 25.70 (Overweight, Pre-obe 05/08/2016  Lipid Panel completed 07/29/2015 HDL 61.000 07/29/2015 LDL 88.000 07/29/2015 Cholesterol, total 171.000 07/29/2015 Triglycerides 110.000 07/29/2015 A1C N/D Glucose Random 91.000 05/08/2016  Post Herpetic Neuralgia Chronic Pain associated to past Shingles (2000) Left shoulder down left side.   H/o Patient has to wear a shirt or coat cut out from the shoulder to hip to avoid pain sensation. Current pain management regime:  Injections with Dr. Ollen Bowl and Tramadol.   Patient uses distraction with reading. Patient  has consulted with several doctors, read about shingles and completed other treatments such as Acupuncture.  Shingles vaccine: yes  See Dr. Ollen Bowl  note 04/25/2015 for h/o past interventions.  H/o recently completed a series of injections with Dr. Ollen Bowl for management of  nerve pain associated to past shingles.  Patient had Thoracic Spinal Cord Stimulator removed on 05/15/16 due to no improvement noted after one year.    Patient is pleased with management he is receiving from Dr. Ollen Bowl.   Medications:  Patient taking less than 10 medications Co-pay cost issues: none  Prevnar (PCV13) [facility] 12/05/2013 PCV23 N/D Flu Vaccine 03/11/2016 tDAP Vaccine 01/16/2011 Shingles Vaccine:  yes   Encounter Medications:  Outpatient Encounter Prescriptions as of 05/18/2016  Medication Sig  . aspirin 81 MG tablet Take 81 mg by mouth daily.  . Coenzyme Q10 150 MG CAPS Take 1 capsule by mouth daily.   Marland Kitchen HYDROcodone-acetaminophen (NORCO) 10-325 MG tablet Take 1 tablet by mouth every 4 (four) hours as needed for severe pain.  . Multiple Vitamin (MULTIVITAMIN) tablet Take 1 tablet by mouth daily.  . Omega-3 Fatty Acids (FISH OIL) 1200 MG CAPS Take 1 capsule by mouth daily.   . pravastatin (PRAVACHOL) 40 MG tablet Take 40 mg by mouth daily.  . quinapril (ACCUPRIL) 20 MG tablet Take 40 mg by mouth daily.   . traMADol (ULTRAM) 50 MG tablet Take 1-2 tablets by mouth daily as needed for moderate pain.   Marland Kitchen UNABLE TO FIND Med Name: muscadine grape pills Take 1 capsule by mouth daily  . valACYclovir (VALTREX) 500 MG tablet Take 1 tablet by mouth 2 (two) times daily.  . vitamin E 400 UNIT capsule Take 400 Units by mouth daily.   No facility-administered encounter medications on file as of 05/18/2016.     Functional Status:  In your present state of health, do you  have any difficulty performing the following activities: 05/08/2016 03/19/2016  Hearing? Y N  Vision? Y N  Difficulty concentrating or making decisions? N N  Walking or climbing stairs? N N  Dressing or bathing? N N  Doing errands, shopping? N N  Preparing Food and eating ? - N  Using the Toilet?  - N  In the past six months, have you accidently leaked urine? - N  Do you have problems with loss of bowel control? - N  Managing your Medications? - N  Managing your Finances? - N  Housekeeping or managing your Housekeeping? - N  Some recent data might be hidden    Fall/Depression Screening: PHQ 2/9 Scores 04/17/2016 03/19/2016 03/19/2016  PHQ - 2 Score 0 0 0    Fall Risk  05/18/2016 04/17/2016 03/19/2016  Falls in the past year? No No No    Preventives: Hearing: yearly checks with Primary and no issues found.  Eyes:  Yearly and wears eyeglasses  Dentist:  Yes every 6 months  Colonoscopy:  08/01/2015  Assessment:  H/o Referral Date:  03/10/16 Source:  Self Referral [phone] Issue:  Prostate Cancer 2000, 2 meds, sees a neurologist for complications due to shingles but damaged nerve down the back of left side.  Pain management is not managed.  Acupuncture, Dr. Maryjean Ka doing injections to help tone it down.   Insurance:  HTA Patient agreed that [facility] Goals of Care met.  States confidence in his provider, Dr. Maryjean Ka and starting his 18th year of care with MD.    Plan:  Referral 03/10/2016 Screening and Initial Assessment: 03/10/2016 Telephonic RN CM:  03/19/16 Program: Chronic Pain Management 03/19/16  Post Herpetic Neuralgia, Chronic Pain, Shingles (2000) [facility] Education Materials (mailed 03/19/16 / Reviewed 04/17/16, 05/18/2016) -Shingles -Chronic Pain - Resources  [facility] Referral 04/17/2016 (completed - no new recommendations) -Medication history review for past medications used or not used for management of Post Herpetic Neuralgia chronic pain associated to shingles (2000).    HTN [employer] Education Materials (mailed 03/19/16 / Reviewed 04/17/16, 05/18/16) -Low-Salt Diet   RN CM advised in case closure / goals met.  RN CM advised to please notify MD of any changes in condition prior to scheduled appt's.  RN CM provided contact name and # [phone] or main office #  [phone] and 24-hour nurse line # [phone].  RN CM confirmed patient is aware of 911 services for urgent emergency needs.  THN notified case closed.  Patient case closure letter sent Physician case closure letter sent.   Nathaneil Canary, BSN, RN, [facility] Management Care Management Coordinator [phone] Direct [phone] Cell [phone] Office [phone] Fax [email or web address]

## 2016-05-27 DIAGNOSIS — B0229 Other postherpetic nervous system involvement: Secondary | ICD-10-CM | POA: Diagnosis not present

## 2016-06-12 ENCOUNTER — Ambulatory Visit: Payer: PPO

## 2016-07-17 ENCOUNTER — Ambulatory Visit: Payer: PPO

## 2016-07-20 DIAGNOSIS — B0229 Other postherpetic nervous system involvement: Secondary | ICD-10-CM | POA: Diagnosis not present

## 2016-07-29 DIAGNOSIS — I10 Essential (primary) hypertension: Secondary | ICD-10-CM | POA: Diagnosis not present

## 2016-07-29 DIAGNOSIS — H6120 Impacted cerumen, unspecified ear: Secondary | ICD-10-CM | POA: Diagnosis not present

## 2016-07-29 DIAGNOSIS — Z125 Encounter for screening for malignant neoplasm of prostate: Secondary | ICD-10-CM | POA: Diagnosis not present

## 2016-07-29 DIAGNOSIS — H6123 Impacted cerumen, bilateral: Secondary | ICD-10-CM | POA: Diagnosis not present

## 2016-07-29 DIAGNOSIS — E7800 Pure hypercholesterolemia, unspecified: Secondary | ICD-10-CM | POA: Diagnosis not present

## 2016-07-29 DIAGNOSIS — Z0000 Encounter for general adult medical examination without abnormal findings: Secondary | ICD-10-CM | POA: Diagnosis not present

## 2016-07-29 DIAGNOSIS — Z8546 Personal history of malignant neoplasm of prostate: Secondary | ICD-10-CM | POA: Diagnosis not present

## 2016-08-14 ENCOUNTER — Ambulatory Visit: Payer: PPO

## 2016-08-20 DIAGNOSIS — I10 Essential (primary) hypertension: Secondary | ICD-10-CM | POA: Diagnosis not present

## 2016-08-20 DIAGNOSIS — B0229 Other postherpetic nervous system involvement: Secondary | ICD-10-CM | POA: Diagnosis not present

## 2016-08-24 DIAGNOSIS — H1013 Acute atopic conjunctivitis, bilateral: Secondary | ICD-10-CM | POA: Diagnosis not present

## 2016-09-09 DIAGNOSIS — B0229 Other postherpetic nervous system involvement: Secondary | ICD-10-CM | POA: Diagnosis not present

## 2016-11-05 DIAGNOSIS — B0229 Other postherpetic nervous system involvement: Secondary | ICD-10-CM | POA: Diagnosis not present

## 2016-11-29 NOTE — Progress Notes (Signed)
Cardiology Office Note   Date:  11/30/2016   ID:  Stephen Nunez, DOB April 05, 1932, MRN [id number]  PCP:  Lawerance Cruel, MD  Cardiologist:   Dorris Carnes, MD   F/U of HTN      History of Present Illness: Stephen Nunez is a 83 y.o. male with a history of HTN, HLmurmur and LBBB  Myovue normal  Echo showed aortic sclerosis  Pt was last seen in clinic in March 2017 Since seen he denies CP  Breathing is OK  Energy good  He remains very active  NO dizziness      Current Meds  Medication Sig  . aspirin 81 MG tablet Take 81 mg by mouth daily.  . Coenzyme Q10 150 MG CAPS Take 1 capsule by mouth daily.   Marland Kitchen HYDROcodone-acetaminophen (NORCO) 10-325 MG tablet Take 1 tablet by mouth every 4 (four) hours as needed for severe pain.  . Multiple Vitamin (MULTIVITAMIN) tablet Take 1 tablet by mouth daily.  . Omega-3 Fatty Acids (FISH OIL) 1200 MG CAPS Take 1 capsule by mouth daily.   . pravastatin (PRAVACHOL) 40 MG tablet Take 40 mg by mouth daily.  . quinapril (ACCUPRIL) 20 MG tablet Take 40 mg by mouth daily.   . traMADol (ULTRAM) 50 MG tablet Take 1-2 tablets by mouth daily as needed for moderate pain.   Marland Kitchen UNABLE TO FIND Med Name: muscadine grape pills Take 1 capsule by mouth daily  . valACYclovir (VALTREX) 500 MG tablet Take 1 tablet by mouth 2 (two) times daily.  . vitamin E 400 UNIT capsule Take 400 Units by mouth daily.     Allergies:   No known allergies   Past Medical History:  Diagnosis Date  . Arthritis   . Heart murmur   . History of shingles    2000  . Hyperlipidemia   . Hypertension   . Pneumonia    as an infant  . Post herpetic neuralgia   . Prostate cancer    prostate cancer    Past Surgical History:  Procedure Laterality Date  . COLONOSCOPY    . HERNIA REPAIR    . PROSTATE SURGERY    . SPINAL CORD STIMULATOR INSERTION N/A 04/26/2015   Procedure: LUMBAR SPINAL CORD STIMULATOR INSERTION;  Surgeon: Clydell Hakim, MD;  Location: [facility];  Service:  Neurosurgery;  Laterality: N/A;  . SPINAL CORD STIMULATOR REMOVAL N/A 05/15/2016   Procedure: THORACIC SPINAL CORD STIMULATOR REMOVAL;  Surgeon: Clydell Hakim, MD;  Location: [facility];  Service: Neurosurgery;  Laterality: N/A;  THORACIC SPINAL CORD STIMULATOR REMOVAL  . TONSILLECTOMY       Social History:  The patient  reports that he has never smoked. He has never used smokeless tobacco. He reports that he does not drink alcohol or use drugs.   Family History:  The patient's family history includes Cancer in his mother; Heart attack in his brother and father; Multiple sclerosis in his brother; Other in his mother.    ROS:  Please see the history of present illness. All other systems are reviewed and  Negative to the above problem except as noted.    PHYSICAL EXAM: VS:  BP (!) 146/70   Pulse 65   Ht 5' 7.5" (1.715 m)   Wt 73.1 kg (161 lb 3.2 oz)   BMI 24.87 kg/m   GEN: Well nourished, well developed, in no acute distress  HEENT: normal  Neck: no JVD, carotid bruits, or masses Cardiac: RRR; Gr II/VI  mid systolic murmur LSB  No, rubs, or gallops,no edema  Respiratory:  clear to auscultation bilaterally, normal work of breathing GI: soft, nontender, nondistended, + BS  No hepatomegaly  MS: no deformity Moving all extremities   Skin: warm and dry, no rash Neuro:  Strength and sensation are intact Psych: euthymic mood, full affect   EKG:  EKG is ordered today  SR 65  LBBBB   Lipid Panel No results found for: CHOL, TRIG, HDL, CHOLHDL, VLDL, LDLCALC, LDLDIRECT    Wt Readings from Last 3 Encounters:  11/30/16 73.1 kg (161 lb 3.2 oz)  05/08/16 75.3 kg (165 lb 14.4 oz)  03/19/16 75.8 kg (167 lb)      ASSESSMENT AND PLAN:  1  Aortic stenosis  Murmur seems a little different  I would recomm a repeat echo to eval  2  HTN  BP is OK today  Continue meds  3  HL  Get labs from Dr Harrington Challenger  Keep on statin  4 LBBB  Old    Encouraged him to stay active  F/U in 1 year     Current  medicines are reviewed at length with the patient today.  The patient does not have concerns regarding medicines.  Signed, Dorris Carnes, MD  11/30/2016 9:39 AM    [address], [place], [place]  [address] Phone: [phone]; Fax: [phone]

## 2016-11-30 ENCOUNTER — Ambulatory Visit (INDEPENDENT_AMBULATORY_CARE_PROVIDER_SITE_OTHER): Payer: PPO | Admitting: Internal Medicine

## 2016-11-30 ENCOUNTER — Encounter: Payer: Self-pay | Admitting: Internal Medicine

## 2016-11-30 ENCOUNTER — Encounter (INDEPENDENT_AMBULATORY_CARE_PROVIDER_SITE_OTHER): Payer: Self-pay

## 2016-11-30 VITALS — BP 146/70 | HR 65 | Ht 67.5 in | Wt 161.2 lb

## 2016-11-30 DIAGNOSIS — I447 Left bundle-branch block, unspecified: Secondary | ICD-10-CM | POA: Diagnosis not present

## 2016-11-30 DIAGNOSIS — I350 Nonrheumatic aortic (valve) stenosis: Secondary | ICD-10-CM | POA: Diagnosis not present

## 2016-11-30 DIAGNOSIS — E782 Mixed hyperlipidemia: Secondary | ICD-10-CM

## 2016-11-30 DIAGNOSIS — I10 Essential (primary) hypertension: Secondary | ICD-10-CM | POA: Diagnosis not present

## 2016-11-30 NOTE — Patient Instructions (Signed)
Medication Instructions:  Your physician recommends that you continue on your current medications as directed. Please refer to the Current Medication list given to you today.   Labwork: None Ordered   Testing/Procedures: Your physician has requested that you have an echocardiogram. Echocardiography is a painless test that uses sound waves to create images of your heart. It provides your doctor with information about the size and shape of your heart and how well your heart's chambers and valves are working. This procedure takes approximately one hour. There are no restrictions for this procedure.   Follow-Up: Your physician wants you to follow-up in: 1 year with Dr. Harrington Challenger.  You will receive a reminder letter in the mail two months in advance. If you don't receive a letter, please call our office to schedule the follow-up appointment.   If you need a refill on your cardiac medications before your next appointment, please call your pharmacy.   Thank you for choosing [facility]! Christen Bame, RN [phone]

## 2016-12-02 DIAGNOSIS — B0229 Other postherpetic nervous system involvement: Secondary | ICD-10-CM | POA: Diagnosis not present

## 2016-12-09 DIAGNOSIS — I350 Nonrheumatic aortic (valve) stenosis: Secondary | ICD-10-CM | POA: Diagnosis not present

## 2016-12-11 ENCOUNTER — Other Ambulatory Visit: Payer: Self-pay

## 2016-12-11 ENCOUNTER — Ambulatory Visit (HOSPITAL_COMMUNITY): Payer: PPO | Attending: Internal Medicine

## 2016-12-11 DIAGNOSIS — I350 Nonrheumatic aortic (valve) stenosis: Secondary | ICD-10-CM

## 2016-12-11 DIAGNOSIS — I352 Nonrheumatic aortic (valve) stenosis with insufficiency: Secondary | ICD-10-CM | POA: Insufficient documentation

## 2016-12-11 DIAGNOSIS — I10 Essential (primary) hypertension: Secondary | ICD-10-CM | POA: Diagnosis not present

## 2016-12-11 DIAGNOSIS — Z8546 Personal history of malignant neoplasm of prostate: Secondary | ICD-10-CM | POA: Insufficient documentation

## 2016-12-11 DIAGNOSIS — I371 Nonrheumatic pulmonary valve insufficiency: Secondary | ICD-10-CM | POA: Diagnosis not present

## 2016-12-11 MED ORDER — PERFLUTREN LIPID MICROSPHERE
1.0000 mL | INTRAVENOUS | Status: AC | PRN
  Administered 2016-12-11: 1 mL via INTRAVENOUS

## 2016-12-23 ENCOUNTER — Telehealth: Payer: Self-pay | Admitting: Internal Medicine

## 2016-12-23 NOTE — Telephone Encounter (Signed)
New Message    Stephen Nunez is calling to getting the echo results from last weeks test.

## 2016-12-23 NOTE — Telephone Encounter (Signed)
Pt's wife called to get Echo results. Pt is aware that that Dr Harrington Challenger needs to  reviewed it and make recommedations . Pt and wife are aware. Pt's wife  would like to get the results as early as the beginning of the next week.

## 2016-12-25 NOTE — Telephone Encounter (Signed)
Patient aware of results and recommendations.  Verbalized understanding.  Request copy forwarded to PCP as well as copy mailed to patient.  Address verified.  Routed to PCP and sent to medical records to mail copy to patient.

## 2017-01-01 DIAGNOSIS — H2513 Age-related nuclear cataract, bilateral: Secondary | ICD-10-CM | POA: Diagnosis not present

## 2017-02-04 DIAGNOSIS — B0229 Other postherpetic nervous system involvement: Secondary | ICD-10-CM | POA: Diagnosis not present

## 2017-02-09 DIAGNOSIS — R0681 Apnea, not elsewhere classified: Secondary | ICD-10-CM | POA: Diagnosis not present

## 2017-02-24 DIAGNOSIS — Z23 Encounter for immunization: Secondary | ICD-10-CM | POA: Diagnosis not present

## 2017-03-04 DIAGNOSIS — I10 Essential (primary) hypertension: Secondary | ICD-10-CM | POA: Diagnosis not present

## 2017-03-04 DIAGNOSIS — B0229 Other postherpetic nervous system involvement: Secondary | ICD-10-CM | POA: Diagnosis not present

## 2017-03-06 DIAGNOSIS — G4733 Obstructive sleep apnea (adult) (pediatric): Secondary | ICD-10-CM | POA: Diagnosis not present

## 2017-03-08 DIAGNOSIS — G4733 Obstructive sleep apnea (adult) (pediatric): Secondary | ICD-10-CM | POA: Diagnosis not present

## 2017-03-18 DIAGNOSIS — G4733 Obstructive sleep apnea (adult) (pediatric): Secondary | ICD-10-CM | POA: Diagnosis not present

## 2017-04-09 DIAGNOSIS — D692 Other nonthrombocytopenic purpura: Secondary | ICD-10-CM | POA: Diagnosis not present

## 2017-04-09 DIAGNOSIS — L570 Actinic keratosis: Secondary | ICD-10-CM | POA: Diagnosis not present

## 2017-04-09 DIAGNOSIS — I8391 Asymptomatic varicose veins of right lower extremity: Secondary | ICD-10-CM | POA: Diagnosis not present

## 2017-04-09 DIAGNOSIS — L853 Xerosis cutis: Secondary | ICD-10-CM | POA: Diagnosis not present

## 2017-04-09 DIAGNOSIS — L821 Other seborrheic keratosis: Secondary | ICD-10-CM | POA: Diagnosis not present

## 2017-04-09 DIAGNOSIS — L2089 Other atopic dermatitis: Secondary | ICD-10-CM | POA: Diagnosis not present

## 2017-04-09 DIAGNOSIS — L814 Other melanin hyperpigmentation: Secondary | ICD-10-CM | POA: Diagnosis not present

## 2017-05-10 DIAGNOSIS — B0229 Other postherpetic nervous system involvement: Secondary | ICD-10-CM | POA: Diagnosis not present

## 2017-06-14 DIAGNOSIS — I10 Essential (primary) hypertension: Secondary | ICD-10-CM | POA: Diagnosis not present

## 2017-06-14 DIAGNOSIS — B0229 Other postherpetic nervous system involvement: Secondary | ICD-10-CM | POA: Diagnosis not present

## 2017-07-05 DIAGNOSIS — H1013 Acute atopic conjunctivitis, bilateral: Secondary | ICD-10-CM | POA: Diagnosis not present

## 2017-07-05 DIAGNOSIS — T1512XA Foreign body in conjunctival sac, left eye, initial encounter: Secondary | ICD-10-CM | POA: Diagnosis not present

## 2017-07-07 DIAGNOSIS — R262 Difficulty in walking, not elsewhere classified: Secondary | ICD-10-CM | POA: Diagnosis not present

## 2017-07-07 DIAGNOSIS — R269 Unspecified abnormalities of gait and mobility: Secondary | ICD-10-CM | POA: Diagnosis not present

## 2017-07-07 DIAGNOSIS — R531 Weakness: Secondary | ICD-10-CM | POA: Diagnosis not present

## 2017-07-13 DIAGNOSIS — R262 Difficulty in walking, not elsewhere classified: Secondary | ICD-10-CM | POA: Diagnosis not present

## 2017-07-13 DIAGNOSIS — R531 Weakness: Secondary | ICD-10-CM | POA: Diagnosis not present

## 2017-07-13 DIAGNOSIS — R269 Unspecified abnormalities of gait and mobility: Secondary | ICD-10-CM | POA: Diagnosis not present

## 2017-07-15 DIAGNOSIS — R531 Weakness: Secondary | ICD-10-CM | POA: Diagnosis not present

## 2017-07-15 DIAGNOSIS — R262 Difficulty in walking, not elsewhere classified: Secondary | ICD-10-CM | POA: Diagnosis not present

## 2017-07-15 DIAGNOSIS — R269 Unspecified abnormalities of gait and mobility: Secondary | ICD-10-CM | POA: Diagnosis not present

## 2017-08-04 DIAGNOSIS — C61 Malignant neoplasm of prostate: Secondary | ICD-10-CM | POA: Diagnosis not present

## 2017-08-04 DIAGNOSIS — K4090 Unilateral inguinal hernia, without obstruction or gangrene, not specified as recurrent: Secondary | ICD-10-CM | POA: Diagnosis not present

## 2017-08-10 DIAGNOSIS — B0229 Other postherpetic nervous system involvement: Secondary | ICD-10-CM | POA: Diagnosis not present

## 2017-08-17 DIAGNOSIS — Z8546 Personal history of malignant neoplasm of prostate: Secondary | ICD-10-CM | POA: Diagnosis not present

## 2017-08-17 DIAGNOSIS — E7800 Pure hypercholesterolemia, unspecified: Secondary | ICD-10-CM | POA: Diagnosis not present

## 2017-08-17 DIAGNOSIS — I10 Essential (primary) hypertension: Secondary | ICD-10-CM | POA: Diagnosis not present

## 2017-08-23 DIAGNOSIS — Z0000 Encounter for general adult medical examination without abnormal findings: Secondary | ICD-10-CM | POA: Diagnosis not present

## 2017-08-23 DIAGNOSIS — E7800 Pure hypercholesterolemia, unspecified: Secondary | ICD-10-CM | POA: Diagnosis not present

## 2017-08-23 DIAGNOSIS — I10 Essential (primary) hypertension: Secondary | ICD-10-CM | POA: Diagnosis not present

## 2017-08-23 DIAGNOSIS — Z8546 Personal history of malignant neoplasm of prostate: Secondary | ICD-10-CM | POA: Diagnosis not present

## 2017-08-23 DIAGNOSIS — M25569 Pain in unspecified knee: Secondary | ICD-10-CM | POA: Diagnosis not present

## 2017-08-25 ENCOUNTER — Ambulatory Visit: Payer: Self-pay | Admitting: Surgery

## 2017-08-25 DIAGNOSIS — G8929 Other chronic pain: Secondary | ICD-10-CM | POA: Insufficient documentation

## 2017-08-25 DIAGNOSIS — K4090 Unilateral inguinal hernia, without obstruction or gangrene, not specified as recurrent: Secondary | ICD-10-CM | POA: Diagnosis not present

## 2017-08-25 DIAGNOSIS — M21162 Varus deformity, not elsewhere classified, left knee: Secondary | ICD-10-CM | POA: Diagnosis not present

## 2017-08-25 DIAGNOSIS — M25562 Pain in left knee: Secondary | ICD-10-CM | POA: Insufficient documentation

## 2017-08-25 DIAGNOSIS — M1712 Unilateral primary osteoarthritis, left knee: Secondary | ICD-10-CM | POA: Diagnosis not present

## 2017-09-01 NOTE — Patient Instructions (Addendum)
Stephen Nunez  09/01/2017   Your procedure is scheduled on: 09-07-17   Report to [facility] Main  Entrance Report to Admitting at 9:30 AM    Call this number if you have problems the morning of surgery [phone]   Remember: Do not eat food or drink liquids :After Midnight.     Take these medicines the morning of surgery with A SIP OF WATER: None                                You may not have any metal on your body including hair pins and              piercings  Do not wear jewelry, lotions, powders,or  deodorant             Men may shave face and neck.   Do not bring valuables to the hospital. [place].  Contacts, dentures or bridgework may not be worn into surgery.     Patients discharged the day of surgery will not be allowed to drive home.  Name and phone number of your driver: Chales Pelissier [phone]                Please read over the following fact sheets you were given: _____________________________________________________________________           [facility] - Preparing for Surgery Before surgery, you can play an important role.  Because skin is not sterile, your skin needs to be as free of germs as possible.  You can reduce the number of germs on your skin by washing with CHG (chlorahexidine gluconate) soap before surgery.  CHG is an antiseptic cleaner which kills germs and bonds with the skin to continue killing germs even after washing. Please DO NOT use if you have an allergy to CHG or antibacterial soaps.  If your skin becomes reddened/irritated stop using the CHG and inform your nurse when you arrive at Short Stay. Do not shave (including legs and underarms) for at least 48 hours prior to the first CHG shower.  You may shave your face/neck. Please follow these instructions carefully:  1.  Shower with CHG Soap the night before surgery and the  morning of Surgery.  2.  If you choose to  wash your hair, wash your hair first as usual with your  normal  shampoo.  3.  After you shampoo, rinse your hair and body thoroughly to remove the  shampoo.                           4.  Use CHG as you would any other liquid soap.  You can apply chg directly  to the skin and wash                       Gently with a scrungie or clean washcloth.  5.  Apply the CHG Soap to your body ONLY FROM THE NECK DOWN.   Do not use on face/ open                           Wound or open sores.  Avoid contact with eyes, ears mouth and genitals (private parts).                       Wash face,  Genitals (private parts) with your normal soap.             6.  Wash thoroughly, paying special attention to the area where your surgery  will be performed.  7.  Thoroughly rinse your body with warm water from the neck down.  8.  DO NOT shower/wash with your normal soap after using and rinsing off  the CHG Soap.                9.  Pat yourself dry with a clean towel.            10.  Wear clean pajamas.            11.  Place clean sheets on your bed the night of your first shower and do not  sleep with pets. Day of Surgery : Do not apply any lotions/deodorants the morning of surgery.  Please wear clean clothes to the hospital/surgery center.  FAILURE TO FOLLOW THESE INSTRUCTIONS MAY RESULT IN THE CANCELLATION OF YOUR SURGERY PATIENT SIGNATURE_________________________________  NURSE SIGNATURE__________________________________  ________________________________________________________________________

## 2017-09-01 NOTE — Progress Notes (Addendum)
11-30-16 (Epic) EKG  12-11-16 (Epic) ECHO  Reviewed EKG and ECHO with Dr. Ola Spurr Anesthesiology to determine if patient requires cardiac clearance. Pt denies  dizziness, chest pain, or SOB. Per Dr. Ola Spurr, okay to proceed with surgery.

## 2017-09-02 ENCOUNTER — Other Ambulatory Visit: Payer: Self-pay

## 2017-09-02 ENCOUNTER — Encounter (HOSPITAL_COMMUNITY): Payer: Self-pay

## 2017-09-02 ENCOUNTER — Encounter (HOSPITAL_COMMUNITY)
Admission: RE | Admit: 2017-09-02 | Discharge: 2017-09-02 | Disposition: A | Discharge status: Home / Self Care | Payer: PPO | Source: Ambulatory Visit | Attending: Surgery | Admitting: Surgery

## 2017-09-02 DIAGNOSIS — Z01818 Encounter for other preprocedural examination: Secondary | ICD-10-CM | POA: Diagnosis not present

## 2017-09-02 DIAGNOSIS — K4090 Unilateral inguinal hernia, without obstruction or gangrene, not specified as recurrent: Secondary | ICD-10-CM | POA: Diagnosis not present

## 2017-09-02 LAB — BASIC METABOLIC PANEL
Anion gap: 8 (ref 5–15)
BUN: 45 mg/dL — ABNORMAL HIGH (ref 6–20)
CO2: 27 mmol/L (ref 22–32)
Calcium: 9.5 mg/dL (ref 8.9–10.3)
Chloride: 104 mmol/L (ref 101–111)
Creatinine, Ser: 1.26 mg/dL — ABNORMAL HIGH (ref 0.61–1.24)
GFR calc Af Amer: 59 mL/min — ABNORMAL LOW (ref >60)
GFR calc non Af Amer: 51 mL/min — ABNORMAL LOW (ref >60)
Glucose, Bld: 104 mg/dL — ABNORMAL HIGH (ref 65–99)
Potassium: 5.4 mmol/L — ABNORMAL HIGH (ref 3.5–5.1)
Sodium: 139 mmol/L (ref 135–145)

## 2017-09-02 LAB — CBC
HCT: 40.7 % (ref 39.0–52.0)
Hemoglobin: 13 g/dL (ref 13.0–17.0)
MCH: 31.9 pg (ref 26.0–34.0)
MCHC: 31.9 g/dL (ref 30.0–36.0)
MCV: 99.8 fL (ref 78.0–100.0)
Platelets: 184 10*3/uL (ref 150–400)
RBC: 4.08 MIL/uL — ABNORMAL LOW (ref 4.22–5.81)
RDW: 13.4 % (ref 11.5–15.5)
WBC: 6.3 10*3/uL (ref 4.0–10.5)

## 2017-09-02 NOTE — Progress Notes (Signed)
09-02-17 BMP route to Dr. Harlow Asa for review.

## 2017-09-05 ENCOUNTER — Encounter (HOSPITAL_COMMUNITY): Payer: Self-pay | Admitting: Surgery

## 2017-09-05 DIAGNOSIS — K4090 Unilateral inguinal hernia, without obstruction or gangrene, not specified as recurrent: Secondary | ICD-10-CM

## 2017-09-05 NOTE — H&P (Signed)
General Surgery [facility]  Stephen Nunez DOB: 08-20-1931 Single / Language: Cleophus Molt / Race: White Male   History of Present Illness  The patient is a 84 year old male who presents with an inguinal hernia.  CC: right inguinal hernia  Patient is referred by Dr. Melinda Crutch and Dr. Franchot Gallo for surgical evaluation and management of right inguinal hernia. Patient first noted a bulge in the right groin a little over one month ago. This is gradually increased in size. He denies any discomfort. It is always been reducible. He denies any signs or symptoms of obstruction. Patient has a history of left inguinal hernia repair many years ago. He has had no sign of recurrence. Patient was also undergone an open prostatectomy in approximately the year 2000. He presents today accompanied by his wife to discuss hernia repair. Patient is not on any anticoagulation. He does take a baby aspirin daily. He has a heart murmur. He is followed by Dr. Dorris Carnes.   Diagnostic Studies History Colonoscopy  1-5 years ago  Allergies No Known Drug Allergies [08/25/2017]: Allergies Reconciled   Medication History Quinapril HCl (40MG  Tablet, Oral) Active. Pravastatin Sodium (40MG  Tablet, Oral) Active. Aspirin (81MG  Tablet, Oral) Active. Vitamin D3 (2000UNIT Capsule, Oral) Active. CoQ10 (200MG  Capsule, Oral) Active. Turmeric (500MG  Tablet, Oral) Active. Omega 3 (Oral) Specific strength unknown - Active. Multivitamin Adult (Oral) Active. Vitamin B12 (100MCG Tablet, Oral) Active. Medications Reconciled  Social History  Alcohol use  Occasional alcohol use. Caffeine use  Carbonated beverages, Coffee. No drug use  Tobacco use  Former smoker.  Family History  Heart Disease  Father.  Other Problems High blood pressure  Hypercholesterolemia  Inguinal Hernia  Prostate Cancer     Review of Systems General Not Present- Appetite Loss,  Chills, Fatigue, Fever, Night Sweats, Weight Gain and Weight Loss. Skin Not Present- Change in Wart/Mole, Dryness, Hives, Jaundice, New Lesions, Non-Healing Wounds, Rash and Ulcer. HEENT Not Present- Earache, Hearing Loss, Hoarseness, Nose Bleed, Oral Ulcers, Ringing in the Ears, Seasonal Allergies, Sinus Pain, Sore Throat, Visual Disturbances, Wears glasses/contact lenses and Yellow Eyes. Respiratory Not Present- Bloody sputum, Chronic Cough, Difficulty Breathing, Snoring and Wheezing. Breast Not Present- Breast Mass, Breast Pain, Nipple Discharge and Skin Changes. Cardiovascular Not Present- Chest Pain, Difficulty Breathing Lying Down, Leg Cramps, Palpitations, Rapid Heart Rate, Shortness of Breath and Swelling of Extremities. Gastrointestinal Not Present- Abdominal Pain, Bloating, Bloody Stool, Change in Bowel Habits, Chronic diarrhea, Constipation, Difficulty Swallowing, Excessive gas, Gets full quickly at meals, Hemorrhoids, Indigestion, Nausea, Rectal Pain and Vomiting. Male Genitourinary Not Present- Blood in Urine, Change in Urinary Stream, Frequency, Impotence, Nocturia, Painful Urination, Urgency and Urine Leakage. Musculoskeletal Not Present- Back Pain, Joint Pain, Joint Stiffness, Muscle Pain, Muscle Weakness and Swelling of Extremities. Neurological Not Present- Decreased Memory, Fainting, Headaches, Numbness, Seizures, Tingling, Tremor, Trouble walking and Weakness. Psychiatric Not Present- Anxiety, Bipolar, Change in Sleep Pattern, Depression, Fearful and Frequent crying. Endocrine Not Present- Cold Intolerance, Excessive Hunger, Hair Changes, Heat Intolerance, Hot flashes and New Diabetes. Hematology Not Present- Blood Thinners, Easy Bruising, Excessive bleeding, Gland problems, HIV and Persistent Infections.  Vitals  Weight: 159.2 lb Height: 69in Body Surface Area: 1.87 m Body Mass Index: 23.51 kg/m  Temp.: 97.68F  Pulse: 80 (Regular)  BP: 138/72 (Sitting, Left Arm,  Standard)   Physical Exam  See vital signs recorded above  GENERAL APPEARANCE Development: normal Nutritional status: normal Gross deformities: none  SKIN Rash, lesions, ulcers: none Induration, erythema:  none Nodules: none palpable  EYES Conjunctiva and lids: normal Pupils: equal and reactive Iris: normal bilaterally  EARS, NOSE, MOUTH, THROAT External ears: no lesion or deformity External nose: no lesion or deformity Hearing: grossly normal Lips: no lesion or deformity Dentition: normal for age Oral mucosa: moist  NECK Symmetric: yes Trachea: midline Thyroid: no palpable nodules in the thyroid bed  CHEST Respiratory effort: normal Retraction or accessory muscle use: no Breath sounds: normal bilaterally Rales, rhonchi, wheeze: none  CARDIOVASCULAR Auscultation: regular rhythm, normal rate Murmurs: none Pulses: carotid and radial pulse 2+ palpable Lower extremity edema: none Lower extremity varicosities: none  ABDOMEN Distension: none Masses: none palpable Tenderness: none Hepatosplenomegaly: not present Hernia: not present  GENITOURINARY Penis: no lesions Scrotum: no masses Well-healed surgical incision on the left. Palpation in the left inguinal canal with cough and Valsalva shows no sign of recurrent hernia. Obvious large bulge on the right. This is reducible with some manipulation. It prolapses and augments with coughing Valsalva.  MUSCULOSKELETAL Station and gait: normal Digits and nails: no clubbing or cyanosis Muscle strength: grossly normal all extremities Range of motion: grossly normal all extremities Deformity: none  LYMPHATIC Cervical: none palpable Supraclavicular: none palpable  PSYCHIATRIC Oriented to person, place, and time: yes Mood and affect: normal for situation Judgment and insight: appropriate for situation    Assessment & Plan  INGUINAL HERNIA OF RIGHT SIDE WITHOUT OBSTRUCTION OR GANGRENE (K40.90)  Pt Education -  Pamphlet Given - Hernia Surgery: discussed with patient and provided information.  Patient presents with a symptomatic right inguinal hernia which has enlarged. He is accompanied by his wife. They are provided with written literature on hernia repair surgery to review at home.  I have recommended proceeding with open right inguinal hernia repair with mesh as an outpatient surgical procedure. I would recommend doing this at the hospital through same day surgery. We discussed the risk and benefits of the procedure including the potential for recurrence. We discussed the use of mesh. We discussed restrictions on his activities after the procedure. Patient understands and wishes to proceed with surgery in the near future.  The risks and benefits of the procedure have been discussed at length with the patient. The patient understands the proposed procedure, potential alternative treatments, and the course of recovery to be expected. All of the patient's questions have been answered at this time. The patient wishes to proceed with surgery.  Armandina Gemma, [place] [facility] Office: [phone]

## 2017-09-07 ENCOUNTER — Ambulatory Visit (HOSPITAL_COMMUNITY): Payer: PPO | Admitting: Anesthesiology

## 2017-09-07 ENCOUNTER — Encounter (HOSPITAL_COMMUNITY): Payer: Self-pay

## 2017-09-07 ENCOUNTER — Encounter (HOSPITAL_COMMUNITY)
Admission: RE | Disposition: A | Discharge status: Home / Self Care | Payer: Self-pay | Source: Ambulatory Visit | Attending: Surgery

## 2017-09-07 ENCOUNTER — Ambulatory Visit (HOSPITAL_COMMUNITY)
Admission: RE | Admit: 2017-09-07 | Discharge: 2017-09-07 | Disposition: A | Discharge status: Home / Self Care | Payer: PPO | Source: Ambulatory Visit | Attending: Surgery | Admitting: Surgery

## 2017-09-07 DIAGNOSIS — Z7982 Long term (current) use of aspirin: Secondary | ICD-10-CM | POA: Diagnosis not present

## 2017-09-07 DIAGNOSIS — K4090 Unilateral inguinal hernia, without obstruction or gangrene, not specified as recurrent: Secondary | ICD-10-CM | POA: Insufficient documentation

## 2017-09-07 DIAGNOSIS — K4030 Unilateral inguinal hernia, with obstruction, without gangrene, not specified as recurrent: Secondary | ICD-10-CM | POA: Diagnosis not present

## 2017-09-07 DIAGNOSIS — E7800 Pure hypercholesterolemia, unspecified: Secondary | ICD-10-CM | POA: Diagnosis not present

## 2017-09-07 DIAGNOSIS — Z87891 Personal history of nicotine dependence: Secondary | ICD-10-CM | POA: Diagnosis not present

## 2017-09-07 DIAGNOSIS — I10 Essential (primary) hypertension: Secondary | ICD-10-CM | POA: Diagnosis not present

## 2017-09-07 DIAGNOSIS — Z79899 Other long term (current) drug therapy: Secondary | ICD-10-CM | POA: Insufficient documentation

## 2017-09-07 DIAGNOSIS — G8918 Other acute postprocedural pain: Secondary | ICD-10-CM | POA: Diagnosis not present

## 2017-09-07 DIAGNOSIS — G894 Chronic pain syndrome: Secondary | ICD-10-CM | POA: Diagnosis not present

## 2017-09-07 HISTORY — PX: INSERTION OF MESH: SHX5868

## 2017-09-07 HISTORY — PX: INGUINAL HERNIA REPAIR: SHX194

## 2017-09-07 SURGERY — REPAIR, HERNIA, INGUINAL, ADULT
Anesthesia: General | Site: Inguinal | Laterality: Right

## 2017-09-07 MED ORDER — CHLORHEXIDINE GLUCONATE CLOTH 2 % EX PADS: 6.0000 | MEDICATED_PAD | Freq: Once | CUTANEOUS | Status: DC

## 2017-09-07 MED ORDER — MIDAZOLAM HCL 2 MG/2ML IJ SOLN
1.0000 mg | INTRAMUSCULAR | Status: DC
  Administered 2017-09-07: 1 mg via INTRAVENOUS
  Filled 2017-09-07: qty 2

## 2017-09-07 MED ORDER — FENTANYL CITRATE (PF) 100 MCG/2ML IJ SOLN: 25.0000 ug | INTRAMUSCULAR | Status: DC | PRN

## 2017-09-07 MED ORDER — FENTANYL CITRATE (PF) 100 MCG/2ML IJ SOLN
INTRAMUSCULAR | Status: AC
  Filled 2017-09-07: qty 2

## 2017-09-07 MED ORDER — MIDAZOLAM HCL 2 MG/2ML IJ SOLN
INTRAMUSCULAR | Status: AC
  Filled 2017-09-07: qty 2

## 2017-09-07 MED ORDER — ONDANSETRON HCL 4 MG/2ML IJ SOLN
INTRAMUSCULAR | Status: DC | PRN
  Administered 2017-09-07: 4 mg via INTRAVENOUS

## 2017-09-07 MED ORDER — LACTATED RINGERS IV SOLN
INTRAVENOUS | Status: DC
  Administered 2017-09-07 (×2): via INTRAVENOUS

## 2017-09-07 MED ORDER — PROPOFOL 10 MG/ML IV BOLUS
INTRAVENOUS | Status: DC | PRN
  Administered 2017-09-07: 150 mg via INTRAVENOUS

## 2017-09-07 MED ORDER — ROCURONIUM BROMIDE 10 MG/ML (PF) SYRINGE
PREFILLED_SYRINGE | INTRAVENOUS | Status: AC
  Filled 2017-09-07: qty 5

## 2017-09-07 MED ORDER — SUCCINYLCHOLINE CHLORIDE 200 MG/10ML IV SOSY
PREFILLED_SYRINGE | INTRAVENOUS | Status: AC
  Filled 2017-09-07: qty 10

## 2017-09-07 MED ORDER — LIDOCAINE 2% (20 MG/ML) 5 ML SYRINGE
INTRAMUSCULAR | Status: DC | PRN
  Administered 2017-09-07: 60 mg via INTRAVENOUS

## 2017-09-07 MED ORDER — FENTANYL CITRATE (PF) 100 MCG/2ML IJ SOLN
INTRAMUSCULAR | Status: DC | PRN
  Administered 2017-09-07 (×3): 50 ug via INTRAVENOUS

## 2017-09-07 MED ORDER — FENTANYL CITRATE (PF) 100 MCG/2ML IJ SOLN
50.0000 ug | INTRAMUSCULAR | Status: DC
  Administered 2017-09-07: 50 ug via INTRAVENOUS
  Filled 2017-09-07: qty 2

## 2017-09-07 MED ORDER — MEPERIDINE HCL 50 MG/ML IJ SOLN: 6.2500 mg | INTRAMUSCULAR | Status: DC | PRN

## 2017-09-07 MED ORDER — BUPIVACAINE HCL (PF) 0.5 % IJ SOLN
INTRAMUSCULAR | Status: DC | PRN
  Administered 2017-09-07: 20 mL

## 2017-09-07 MED ORDER — PROMETHAZINE HCL 25 MG/ML IJ SOLN
INTRAMUSCULAR | Status: AC
  Filled 2017-09-07: qty 1

## 2017-09-07 MED ORDER — SUGAMMADEX SODIUM 200 MG/2ML IV SOLN
INTRAVENOUS | Status: DC | PRN
  Administered 2017-09-07: 200 mg via INTRAVENOUS

## 2017-09-07 MED ORDER — SUCCINYLCHOLINE CHLORIDE 200 MG/10ML IV SOSY
PREFILLED_SYRINGE | INTRAVENOUS | Status: DC | PRN
  Administered 2017-09-07: 100 mg via INTRAVENOUS

## 2017-09-07 MED ORDER — TRAMADOL HCL 50 MG PO TABS: 50.0000 mg | ORAL_TABLET | Freq: Four times a day (QID) | ORAL | 0 refills | Status: DC | PRN

## 2017-09-07 MED ORDER — 0.9 % SODIUM CHLORIDE (POUR BTL) OPTIME
TOPICAL | Status: DC | PRN
  Administered 2017-09-07: 1000 mL

## 2017-09-07 MED ORDER — DEXAMETHASONE SODIUM PHOSPHATE 10 MG/ML IJ SOLN
INTRAMUSCULAR | Status: DC | PRN
  Administered 2017-09-07: 10 mg via INTRAVENOUS

## 2017-09-07 MED ORDER — PROMETHAZINE HCL 25 MG/ML IJ SOLN
6.2500 mg | Freq: Once | INTRAMUSCULAR | Status: AC
  Administered 2017-09-07: 6.25 mg via INTRAVENOUS

## 2017-09-07 MED ORDER — LIDOCAINE 2% (20 MG/ML) 5 ML SYRINGE
INTRAMUSCULAR | Status: AC
  Filled 2017-09-07: qty 5

## 2017-09-07 MED ORDER — ONDANSETRON HCL 4 MG/2ML IJ SOLN
INTRAMUSCULAR | Status: AC
  Filled 2017-09-07: qty 2

## 2017-09-07 MED ORDER — ROCURONIUM BROMIDE 50 MG/5ML IV SOSY
PREFILLED_SYRINGE | INTRAVENOUS | Status: DC | PRN
  Administered 2017-09-07: 30 mg via INTRAVENOUS

## 2017-09-07 MED ORDER — BUPIVACAINE HCL (PF) 0.5 % IJ SOLN
INTRAMUSCULAR | Status: AC
  Filled 2017-09-07: qty 30

## 2017-09-07 MED ORDER — PROPOFOL 10 MG/ML IV BOLUS
INTRAVENOUS | Status: AC
  Filled 2017-09-07: qty 20

## 2017-09-07 MED ORDER — PROMETHAZINE HCL 25 MG/ML IJ SOLN: 12.5000 mg | Freq: Once | INTRAMUSCULAR | Status: DC | PRN

## 2017-09-07 MED ORDER — DEXAMETHASONE SODIUM PHOSPHATE 10 MG/ML IJ SOLN
INTRAMUSCULAR | Status: AC
  Filled 2017-09-07: qty 1

## 2017-09-07 MED ORDER — CEFAZOLIN SODIUM-DEXTROSE 2-4 GM/100ML-% IV SOLN
2.0000 g | INTRAVENOUS | Status: AC
  Administered 2017-09-07: 2 g via INTRAVENOUS
  Filled 2017-09-07: qty 100

## 2017-09-07 MED ORDER — BUPIVACAINE HCL (PF) 0.75 % IJ SOLN
INTRAMUSCULAR | Status: DC | PRN
  Administered 2017-09-07: 25 mL

## 2017-09-07 SURGICAL SUPPLY — 34 items
APPLICATOR COTTON TIP 6IN STRL (MISCELLANEOUS) ×2 IMPLANT
BLADE SURG 15 STRL LF DISP TIS (BLADE) ×1 IMPLANT
BLADE SURG 15 STRL SS (BLADE) ×1
BLADE SURG SZ10 CARB STEEL (BLADE) IMPLANT
CHLORAPREP W/TINT 26ML (MISCELLANEOUS) ×4 IMPLANT
COVER SURGICAL LIGHT HANDLE (MISCELLANEOUS) ×2 IMPLANT
DECANTER SPIKE VIAL GLASS SM (MISCELLANEOUS) ×2 IMPLANT
DRAIN PENROSE 18X1/2 LTX STRL (DRAIN) ×2 IMPLANT
DRAPE LAPAROTOMY TRNSV 102X78 (DRAPE) ×2 IMPLANT
ELECT PENCIL ROCKER SW 15FT (MISCELLANEOUS) ×2 IMPLANT
ELECT REM PT RETURN 15FT ADLT (MISCELLANEOUS) ×2 IMPLANT
GAUZE SPONGE 4X4 12PLY STRL (GAUZE/BANDAGES/DRESSINGS) ×2 IMPLANT
GLOVE BIOGEL PI IND STRL 7.0 (GLOVE) ×1 IMPLANT
GLOVE BIOGEL PI INDICATOR 7.0 (GLOVE) ×1
GLOVE SURG ORTHO 8.0 STRL STRW (GLOVE) ×2 IMPLANT
GOWN STRL REUS W/TWL LRG LVL3 (GOWN DISPOSABLE) ×2 IMPLANT
GOWN STRL REUS W/TWL XL LVL3 (GOWN DISPOSABLE) ×4 IMPLANT
KIT BASIN OR (CUSTOM PROCEDURE TRAY) ×2 IMPLANT
MESH ULTRAPRO 3X6 7.6X15CM (Mesh General) ×2 IMPLANT
NEEDLE HYPO 25X1 1.5 SAFETY (NEEDLE) ×2 IMPLANT
NS IRRIG 1000ML POUR BTL (IV SOLUTION) ×2 IMPLANT
PACK BASIC VI WITH GOWN DISP (CUSTOM PROCEDURE TRAY) ×2 IMPLANT
SPONGE LAP 4X18 X RAY DECT (DISPOSABLE) ×6 IMPLANT
STRIP CLOSURE SKIN 1/2X4 (GAUZE/BANDAGES/DRESSINGS) ×2 IMPLANT
SUT MNCRL AB 4-0 PS2 18 (SUTURE) ×2 IMPLANT
SUT NOVA NAB GS-21 0 18 T12 DT (SUTURE) IMPLANT
SUT NOVA NAB GS-22 2 0 T19 (SUTURE) ×4 IMPLANT
SUT SILK 2 0 SH (SUTURE) ×2 IMPLANT
SUT VIC AB 3-0 SH 18 (SUTURE) ×2 IMPLANT
SYR BULB IRRIGATION 50ML (SYRINGE) ×2 IMPLANT
SYR CONTROL 10ML LL (SYRINGE) ×2 IMPLANT
TAPE CLOTH SURG 4X10 WHT LF (GAUZE/BANDAGES/DRESSINGS) ×2 IMPLANT
TOWEL OR 17X26 10 PK STRL BLUE (TOWEL DISPOSABLE) ×2 IMPLANT
YANKAUER SUCT BULB TIP 10FT TU (MISCELLANEOUS) ×2 IMPLANT

## 2017-09-07 NOTE — Progress Notes (Signed)
Phase II PACU Nsg Note: Pt meets DC criteria to go home per MD orders. Pt alert and oriented, vital signs stable, denies any pain or discomfort at surgical site, able to void and tolerate PO fluids well. Wife at bedside, DC instructions done re: (1) dsg/ wound care and to remove dsg in 48 hours per MD orders, also S&S of infection. (2) safety while taking pain med (3) post op diet per surgeon and anesthesia MD recommendations. (4) to make and keep post op appointment. (5) activity as outlined by anesthesia MD and general surgeon. Opportunity for questions provided several times during pt teaching, Teach Back Method used. Copy of DC instructions and one Rx given to wife. Pt escorted to exit via w/c with nurse tech. Opportunity for questions provided again prior to departure from Phase II PACU

## 2017-09-07 NOTE — Anesthesia Preprocedure Evaluation (Signed)
Anesthesia Evaluation  Patient identified by MRN, date of birth, ID band Patient awake    Reviewed: Allergy & Precautions, NPO status , Patient's Chart, lab work & pertinent test results  History of Anesthesia Complications Negative for: history of anesthetic complications  Airway Mallampati: II  TM Distance: >3 FB Neck ROM: Full    Dental  (+) Dental Advisory Given, Teeth Intact   Pulmonary    breath sounds clear to auscultation       Cardiovascular hypertension, Pt. on medications  Rhythm:Regular  ECHO 6/18   Left ventricle: The cavity size was normal. Wall thickness was   normal. Systolic function was mildly reduced. The estimated   ejection fraction was 45%.   No significant change from echo images of 2017 Doppler parameters   are consistent with abnormal left ventricular relaxation (grade 1   diastolic dysfunction). - Aortic valve: AV is thickened, calcified with restricted motion   Peak and mean gradients through the valve are 22 and 15 mm Hg   respectively consistent with very mild AS On 2D images, AV   appears mild to moderately steonitic No significant change in   gradients from 2017 There was mild regurgitation. Mean gradient   (S): 15 mm Hg. Peak gradient (S): 23 mm Hg. - Mitral valve: Calcified annulus. Mildly thickened leaflets . - Pulmonary arteries: PA peak pressure: 32 mm Hg (S).   Neuro/Psych  Neuromuscular disease negative psych ROS   GI/Hepatic negative GI ROS, Neg liver ROS,   Endo/Other  negative endocrine ROS  Renal/GU negative Renal ROS     Musculoskeletal  (+) Arthritis ,   Abdominal   Peds  Hematology negative hematology ROS (+)   Anesthesia Other Findings   Reproductive/Obstetrics                             Anesthesia Physical  Anesthesia Plan  ASA: II  Anesthesia Plan: General   Post-op Pain Management: GA combined w/ Regional for post-op pain    Induction: Intravenous  PONV Risk Score and Plan: 1 and Ondansetron and Treatment may vary due to age or medical condition  Airway Management Planned: Oral ETT and LMA  Additional Equipment:   Intra-op Plan:   Post-operative Plan: Extubation in OR  Informed Consent: I have reviewed the patients History and Physical, chart, labs and discussed the procedure including the risks, benefits and alternatives for the proposed anesthesia with the patient or authorized representative who has indicated his/her understanding and acceptance.   Dental advisory given  Plan Discussed with: CRNA, Anesthesiologist and Surgeon  Anesthesia Plan Comments: (  )        Anesthesia Quick Evaluation

## 2017-09-07 NOTE — Op Note (Signed)
Inguinal Hernia, Open, Procedure Note  Pre-operative Diagnosis:  Right inguinal hernia  Post-operative Diagnosis: same  Surgeon:  Earnstine Regal, MD, FACS  Anesthesia:  General  Preparation:  Chlora-prep  Estimated Blood Loss: minimal  Complications:  none  Indications: The patient presented with a right, reducible hernia.    Procedure Details  The patient was evaluated in the holding area. All of the patient's questions were answered and the proposed procedure was confirmed. The site of the procedure was properly marked. The patient was taken to the Operating Room, identified by name, and the procedure verified as inguinal hernia repair.  The patient was placed in the supine position and underwent induction of anesthesia. A "Time Out" was performed per routine. The lower abdomen and groin were prepped and draped in the usual aseptic fashion.  After ascertaining that an adequate level of anesthesia had been obtained, an incision was made in the groin with a #10 blade.  Dissection was carried through the subcutaneous tissues and hemostasis obtained with the electrocautery.  A Gelpi retractor was placed for exposure.  The external oblique fascia was incised in line with it's fibers and extended through the external inguinal ring.  The cord structures were dissected out of the inguinal canal and encircled with a Penrose drain.  The floor of the inguinal canal was dissected out.  There was no evidence of a direct defect.  The cord was explored and a moderately large hernia sac was indentified and dissected out up to the level of the internal inguinal ring.  A high ligation of the sac was performed with a 2-0 Silk suture ligature and the sac excised and discarded.  The floor of the inguinal canal was reconstructed with Ethicon Ultrapro mesh cut to the appropriate dimensions.  It was secured to the pubic tubercle with a 2-0 Novafil suture and along the inguinal ligament with a running 2-0 Novafil  suture.  Mesh was split to accommodate the cord structures.  The superior margin of the mesh was secured to the transversalis and internal oblique musculature with interrupted 2-0 Novafil sutures.  The tails of the mesh were overlapped lateral to the cord structures and secured to the inguinal ligament with interrupted 2-0 Novafil sutures to recreate the internal inguinal ring.  Cord structures were returned to the inguinal canal.  Local anesthetic was infiltrated throughout the field.  External oblique fascia was closed with interrupted 3-0 Vicryl sutures.  Subcutaneous tissues were closed with interrupted 3-0 Vicryl sutures.  Skin was anesthetized with local anesthetic, and the skin edges were re-approximated with a running 4-0 Monocryl suture.  Wound was washed and dried and steristrips were applied.  A dry gauze dressing was applied.  Instrument, sponge, and needle counts were correct prior to closure and at the conclusion of the case.  The patient tolerated the procedure well.  The patient was awakened from anesthesia and brought to the recovery room in stable condition.  Armandina Gemma, MD [facility] Office: [phone]

## 2017-09-07 NOTE — Interval H&P Note (Signed)
History and Physical Interval Note:  09/07/2017 12:19 PM  Stephen Nunez  has presented today for surgery, with the diagnosis of right inguinal hernia, reducible.  The various methods of treatment have been discussed with the patient and family. After consideration of risks, benefits and other options for treatment, the patient has consented to    Procedure(s): OPEN REPAIR RIGHT INGUINAL HERNIA (Right) INSERTION OF MESH (Right) as a surgical intervention .    The patient's history has been reviewed, patient examined, no change in status, stable for surgery.  I have reviewed the patient's chart and labs.  Questions were answered to the patient's satisfaction.    Armandina Gemma, [place] [facility] Office: [place]

## 2017-09-07 NOTE — Discharge Instructions (Signed)
[facility]  HERNIA REPAIR POST OP INSTRUCTIONS  Always review your discharge instruction sheet given to you by the facility where your surgery was performed.  1. A  prescription for pain medication may be given to you upon discharge.  Take your pain medication as prescribed.  If narcotic pain medicine is not needed, then you may take acetaminophen (Tylenol) or ibuprofen (Advil) as needed.  2. Take your usually prescribed medications unless otherwise directed.  3. If you need a refill on your pain medication, please contact your pharmacy.  They will contact our office to request authorization. Prescriptions will not be filled after 5 pm daily or on weekends.  4. You should follow a light diet the first 24 hours after arrival home, such as soup and crackers or toast.  Be sure to include plenty of fluids daily.  Resume your normal diet the day after surgery.  5. Most patients will experience some swelling and bruising around the surgical site.  Ice packs and reclining will help.  Swelling and bruising can take several days to resolve.   6. It is common to experience some constipation if taking pain medication after surgery.  Increasing fluid intake and taking a stool softener (such as Colace) will usually help or prevent this problem from occurring.  A mild laxative (Milk of Magnesia or Miralax) should be taken according to package directions if there are no bowel movements after 48 hours.  7. Unless discharge instructions indicate otherwise, you may remove your bandages 24-48 hours after surgery, and you may shower at that time.  You will have steri-strips (small skin tapes) in place directly over the incision.  These strips should be left on the skin for 5-7 days.  Any sutures or staples will be removed at the office during your follow-up visit.  8. ACTIVITIES:  You may resume regular (light) daily activities beginning the next day - such as daily self-care, walking, climbing  stairs - gradually increasing activities as tolerated.  You may have sexual intercourse when it is comfortable.  Refrain from any heavy lifting or straining until approved by your doctor.  You may drive when you are no longer taking prescription pain medication, you can comfortably wear a seatbelt, and you can safely maneuver your car and apply brakes.  9. You should see your doctor in the office for a follow-up appointment approximately 2-3 weeks after your surgery.  Make sure that you call for this appointment within a day or two after you arrive home to insure a convenient appointment time. 10.   WHEN TO CALL YOUR DOCTOR: 1. Fever greater than 101.0 2. Inability to urinate 3. Persistent nausea and/or vomiting 4. Extreme swelling or bruising 5. Continued bleeding from incision 6. Increased pain, redness, or drainage from the incision  The clinic staff is available to answer your questions during regular business hours.  Please dont hesitate to call and ask to speak to one of the nurses for clinical concerns.  If you have a medical emergency, go to the nearest emergency room or call 911.  A surgeon from [facility] is always on call for the hospital.   [facility] [address] [place], [place], [place]  [address]  [phone] ? [phone] ? [phone]  [email or web address]

## 2017-09-07 NOTE — Progress Notes (Signed)
PACU Nursing note: pt moved to Phase II PACU, assisted to restroom, pt alert and oriented, voided large amt of clear colored urine without difficulty, moved to recliner after restroom. Family called to Phase II area.

## 2017-09-07 NOTE — Anesthesia Procedure Notes (Signed)
Anesthesia Regional Block: TAP block   Pre-Anesthetic Checklist: ,, timeout performed, Correct Patient, Correct Site, Correct Laterality, Correct Procedure, Correct Position, site marked, Risks and benefits discussed,  Surgical consent,  Pre-op evaluation,  At surgeon's request and post-op pain management  Laterality: Right  Prep: chloraprep       Needles:  Injection technique: Single-shot  Needle Type: Echogenic Stimulator Needle     Needle Length: 5cm  Needle Gauge: 22     Additional Needles:   Procedures:, nerve stimulator,,, ultrasound used (permanent image in chart),,,,  Narrative:  Start time: 09/07/2017 11:50 AM End time: 09/07/2017 11:55 AM Injection made incrementally with aspirations every 5 mL.  Performed by: Personally  Anesthesiologist: Janeece Riggers, MD  Additional Notes: Functioning IV was confirmed and monitors were applied.  A 78mm 22ga Arrow echogenic stimulator needle was used. Sterile prep and drape,hand hygiene and sterile gloves were used. Ultrasound guidance: relevant anatomy identified, needle position confirmed, local anesthetic spread visualized around nerve(s)., vascular puncture avoided.  Image printed for medical record. Negative aspiration and negative test dose prior to incremental administration of local anesthetic. The patient tolerated the procedure well.

## 2017-09-07 NOTE — Anesthesia Postprocedure Evaluation (Signed)
Anesthesia Post Note  Patient: Stephen Nunez  Procedure(s) Performed: OPEN REPAIR RIGHT INGUINAL HERNIA (Right Inguinal) INSERTION OF MESH (Right Inguinal)     Patient location during evaluation: PACU Anesthesia Type: General Level of consciousness: awake and alert Pain management: pain level controlled Vital Signs Assessment: post-procedure vital signs reviewed and stable Respiratory status: spontaneous breathing, nonlabored ventilation, respiratory function stable and patient connected to nasal cannula oxygen Cardiovascular status: blood pressure returned to baseline and stable Postop Assessment: no apparent nausea or vomiting Anesthetic complications: no    Last Vitals:  Vitals:   09/07/17 1430 09/07/17 1445  BP: (!) 161/73 (!) 164/61  Pulse: 65 64  Resp: 13 (!) 9  Temp:    SpO2: 95% 97%    Last Pain:  Vitals:   09/07/17 0953  TempSrc: Oral                 Anushka Hartinger

## 2017-09-07 NOTE — Anesthesia Procedure Notes (Signed)
Procedure Name: Intubation Date/Time: 09/07/2017 12:37 PM Performed by: Maxwell Caul, CRNA Pre-anesthesia Checklist: Patient identified, Emergency Drugs available, Suction available and Patient being monitored Patient Re-evaluated:Patient Re-evaluated prior to induction Oxygen Delivery Method: Circle system utilized Preoxygenation: Pre-oxygenation with 100% oxygen Induction Type: IV induction Ventilation: Mask ventilation without difficulty Laryngoscope Size: Mac and 4 Grade View: Grade I Tube type: Oral Tube size: 7.5 mm Number of attempts: 1 Airway Equipment and Method: Stylet Placement Confirmation: ETT inserted through vocal cords under direct vision,  positive ETCO2 and breath sounds checked- equal and bilateral Secured at: 21 cm Tube secured with: Tape Dental Injury: Teeth and Oropharynx as per pre-operative assessment

## 2017-09-07 NOTE — Transfer of Care (Signed)
Immediate Anesthesia Transfer of Care Note  Patient: Stephen Nunez  Procedure(s) Performed: OPEN REPAIR RIGHT INGUINAL HERNIA (Right Inguinal) INSERTION OF MESH (Right Inguinal)  Patient Location: PACU  Anesthesia Type:General  Level of Consciousness: awake, alert  and oriented  Airway & Oxygen Therapy: Patient Spontanous Breathing and Patient connected to face mask oxygen  Post-op Assessment: Report given to RN and Post -op Vital signs reviewed and stable  Post vital signs: Reviewed and stable  Last Vitals:  Vitals:   09/07/17 1202 09/07/17 1203  BP:  (!) 132/58  Pulse: (!) 59 63  Resp: 15 18  Temp:    SpO2: 100% 100%    Last Pain:  Vitals:   09/07/17 0953  TempSrc: Oral         Complications: No apparent anesthesia complications

## 2017-09-07 NOTE — Progress Notes (Signed)
Assisted Dr. Oddono with right, ultrasound guided, transabdominal plane block. Side rails up, monitors on throughout procedure. See vital signs in flow sheet. Tolerated Procedure well. 

## 2017-09-20 DIAGNOSIS — T1512XA Foreign body in conjunctival sac, left eye, initial encounter: Secondary | ICD-10-CM | POA: Diagnosis not present

## 2017-12-10 ENCOUNTER — Encounter: Payer: Self-pay | Admitting: Internal Medicine

## 2017-12-16 DIAGNOSIS — B0229 Other postherpetic nervous system involvement: Secondary | ICD-10-CM | POA: Diagnosis not present

## 2017-12-20 DIAGNOSIS — G4733 Obstructive sleep apnea (adult) (pediatric): Secondary | ICD-10-CM | POA: Diagnosis not present

## 2017-12-23 ENCOUNTER — Encounter: Payer: Self-pay | Admitting: Internal Medicine

## 2017-12-23 ENCOUNTER — Encounter (INDEPENDENT_AMBULATORY_CARE_PROVIDER_SITE_OTHER): Payer: Self-pay

## 2017-12-23 ENCOUNTER — Ambulatory Visit: Payer: PPO | Admitting: Internal Medicine

## 2017-12-23 VITALS — BP 154/86 | HR 65 | Ht 69.0 in | Wt 158.4 lb

## 2017-12-23 DIAGNOSIS — I10 Essential (primary) hypertension: Secondary | ICD-10-CM | POA: Diagnosis not present

## 2017-12-23 DIAGNOSIS — I350 Nonrheumatic aortic (valve) stenosis: Secondary | ICD-10-CM

## 2017-12-23 DIAGNOSIS — E782 Mixed hyperlipidemia: Secondary | ICD-10-CM

## 2017-12-23 DIAGNOSIS — I447 Left bundle-branch block, unspecified: Secondary | ICD-10-CM | POA: Diagnosis not present

## 2017-12-23 NOTE — Patient Instructions (Addendum)
Your physician recommends that you continue on your current medications as directed. Please refer to the Current Medication list given to you today.  Your physician recommends that you schedule a follow-up appointment in: 2 months and bring your home BP cuff and list of home BP readings.

## 2017-12-23 NOTE — Progress Notes (Signed)
Cardiology Office Note   Date:  12/23/2017   ID:  Stephen Nunez, DOB 1932-02-19, MRN [id number]  PCP:  Lawerance Cruel, MD  Cardiologist:   Dorris Carnes, MD   F/U of HTN  And AS    History of Present Illness: Stephen Nunez is a 84 y.o. male with a history of HTN, HLmurmur and LBBB  Myovue normal  In 2015 Pt was last seen in clinic in June 2018 Echo last year showed normal LVEF  AS was mild   Mean gradient 15 mm LVEF 45%    The pt denies CP   Breathing is OK   No dizziness Exercises daily   Just got back from cross country trip     Current Meds  Medication Sig  . acetaminophen (TYLENOL) 500 MG tablet Take 1,000 mg by mouth every 6 (six) hours as needed (for pain.).  Marland Kitchen aspirin EC 81 MG tablet Take 81 mg by mouth daily at 2 PM.  . Bioflavonoid Products (GRAPE SEED PO) Take 2 capsules by mouth daily. [place] Grape Capsules  . Cholecalciferol (VITAMIN D3) 2000 units TABS Take 2,000 Units by mouth daily.  . Coenzyme Q10 (CO Q-10) 200 MG CAPS Take 200 mg by mouth daily.  . Cyanocobalamin (VITAMIN B-12) 5000 MCG LOZG Take 5,000 mcg by mouth daily.  . Glucosamine HCl 1500 MG TABS Take 1,500 mg by mouth daily.  . Multiple Vitamin (MULTIVITAMIN WITH MINERALS) TABS tablet Take 1 tablet by mouth daily.  Marland Kitchen OVER THE COUNTER MEDICATION Apply 1 application topically 4 (four) times daily as needed (for back pain due to shingles.). CBD CREAM (HEMP)  . pravastatin (PRAVACHOL) 40 MG tablet Take 40 mg by mouth daily at 2 PM.   . quinapril (ACCUPRIL) 40 MG tablet Take 40 mg by mouth daily.  . traMADol (ULTRAM) 50 MG tablet Take 1-2 tablets (50-100 mg total) by mouth every 6 (six) hours as needed for moderate pain.  . vitamin E 400 UNIT capsule Take 400 Units by mouth daily.     Allergies:   No known allergies   Past Medical History:  Diagnosis Date  . Arthritis   . Heart murmur   . History of shingles    2000  . Hyperlipidemia   . Hypertension   . Pneumonia    as an infant  .  Post herpetic neuralgia   . Prostate cancer    prostate cancer    Past Surgical History:  Procedure Laterality Date  . COLONOSCOPY    . HERNIA REPAIR    . INGUINAL HERNIA REPAIR Right 09/07/2017   Procedure: OPEN REPAIR RIGHT INGUINAL HERNIA;  Surgeon: Armandina Gemma, MD;  Location: [facility];  Service: General;  Laterality: Right;  . INSERTION OF MESH Right 09/07/2017   Procedure: INSERTION OF MESH;  Surgeon: Armandina Gemma, MD;  Location: [facility];  Service: General;  Laterality: Right;  . PROSTATE SURGERY    . SPINAL CORD STIMULATOR INSERTION N/A 04/26/2015   Procedure: LUMBAR SPINAL CORD STIMULATOR INSERTION;  Surgeon: Clydell Hakim, MD;  Location: [facility];  Service: Neurosurgery;  Laterality: N/A;  . SPINAL CORD STIMULATOR REMOVAL N/A 05/15/2016   Procedure: THORACIC SPINAL CORD STIMULATOR REMOVAL;  Surgeon: Clydell Hakim, MD;  Location: [facility];  Service: Neurosurgery;  Laterality: N/A;  THORACIC SPINAL CORD STIMULATOR REMOVAL  . TONSILLECTOMY       Social History:  The patient  reports that he has never smoked. He has never used smokeless tobacco. He  reports that he does not drink alcohol or use drugs.   Family History:  The patient's family history includes Cancer in his mother; Heart attack in his brother and father; Multiple sclerosis in his brother; Other in his mother.    ROS:  Please see the history of present illness. All other systems are reviewed and  Negative to the above problem except as noted.    PHYSICAL EXAM: VS:  BP (!) 154/86   Pulse 65   Ht 5\' 9"  (1.753 m)   Wt 71.8 kg (158 lb 6.4 oz)   BMI 23.39 kg/m   GEN: Well nourished, well developed, in no acute distress  HEENT: normal  Neck: no JVD, carotid bruits, or masses Cardiac: RRR; Gr III/VI mid systolic murmur heard through chest, best at LSB  No, rubs, or gallops,no edema  Respiratory:  clear to auscultation bilaterally, normal work of breathing GI: soft, nontender, nondistended, + BS  No hepatomegaly  MS: no  deformity Moving all extremities   Skin: warm and dry, no rash Neuro:  Strength and sensation are intact Psych: euthymic mood, full affect   EKG:  EKG is ordered today  SR 65 bpm   LBBB   Lipid Panel No results found for: CHOL, TRIG, HDL, CHOLHDL, VLDL, LDLCALC, LDLDIRECT    Wt Readings from Last 3 Encounters:  12/23/17 71.8 kg (158 lb 6.4 oz)  09/07/17 71.3 kg (157 lb 2 oz)  09/02/17 71.3 kg (157 lb 2 oz)      ASSESSMENT AND PLAN:  1  Aortic stenosis  AS remains mild to mod   Follow   Exam sugg greater   He is asymtpomatic   Will plan repeat echo this coimng winter \  2  HTN  BP is up some   ON my check 148/76    He is about to use device for sleep apnea  I asked him to keep log of BPs    Bring cuff to clinic  See again in AUg/Sept  3  HL  LDL 96  HDL 59   Checked in Feb 2019   Keep on same regimen    4 LBBB  Old    Encouraged him to stay active  F/U in Aug   Current medicines are reviewed at length with the patient today.  The patient does not have concerns regarding medicines.  Signed, Dorris Carnes, MD  12/23/2017 8:08 AM    [address], [place], [place]  [address] Phone: [phone]; Fax: [phone]

## 2018-01-03 DIAGNOSIS — H2513 Age-related nuclear cataract, bilateral: Secondary | ICD-10-CM | POA: Diagnosis not present

## 2018-01-13 DIAGNOSIS — I10 Essential (primary) hypertension: Secondary | ICD-10-CM | POA: Diagnosis not present

## 2018-01-13 DIAGNOSIS — B0229 Other postherpetic nervous system involvement: Secondary | ICD-10-CM | POA: Diagnosis not present

## 2018-01-13 DIAGNOSIS — R269 Unspecified abnormalities of gait and mobility: Secondary | ICD-10-CM | POA: Diagnosis not present

## 2018-01-28 DIAGNOSIS — H25041 Posterior subcapsular polar age-related cataract, right eye: Secondary | ICD-10-CM | POA: Diagnosis not present

## 2018-01-28 DIAGNOSIS — H2513 Age-related nuclear cataract, bilateral: Secondary | ICD-10-CM | POA: Diagnosis not present

## 2018-02-09 DIAGNOSIS — S41112A Laceration without foreign body of left upper arm, initial encounter: Secondary | ICD-10-CM | POA: Diagnosis not present

## 2018-02-14 DIAGNOSIS — S41112A Laceration without foreign body of left upper arm, initial encounter: Secondary | ICD-10-CM | POA: Diagnosis not present

## 2018-02-15 DIAGNOSIS — H25811 Combined forms of age-related cataract, right eye: Secondary | ICD-10-CM | POA: Diagnosis not present

## 2018-02-15 DIAGNOSIS — H25041 Posterior subcapsular polar age-related cataract, right eye: Secondary | ICD-10-CM | POA: Diagnosis not present

## 2018-02-15 DIAGNOSIS — H2511 Age-related nuclear cataract, right eye: Secondary | ICD-10-CM | POA: Diagnosis not present

## 2018-02-16 DIAGNOSIS — S41112A Laceration without foreign body of left upper arm, initial encounter: Secondary | ICD-10-CM | POA: Diagnosis not present

## 2018-02-17 DIAGNOSIS — G4733 Obstructive sleep apnea (adult) (pediatric): Secondary | ICD-10-CM | POA: Diagnosis not present

## 2018-02-19 DIAGNOSIS — S41112D Laceration without foreign body of left upper arm, subsequent encounter: Secondary | ICD-10-CM | POA: Diagnosis not present

## 2018-02-23 DIAGNOSIS — S41112D Laceration without foreign body of left upper arm, subsequent encounter: Secondary | ICD-10-CM | POA: Diagnosis not present

## 2018-03-01 DIAGNOSIS — Z23 Encounter for immunization: Secondary | ICD-10-CM | POA: Diagnosis not present

## 2018-03-07 DIAGNOSIS — S41119A Laceration without foreign body of unspecified upper arm, initial encounter: Secondary | ICD-10-CM | POA: Diagnosis not present

## 2018-03-09 DIAGNOSIS — S41119A Laceration without foreign body of unspecified upper arm, initial encounter: Secondary | ICD-10-CM | POA: Diagnosis not present

## 2018-03-11 DIAGNOSIS — H6120 Impacted cerumen, unspecified ear: Secondary | ICD-10-CM | POA: Diagnosis not present

## 2018-03-11 DIAGNOSIS — S41119A Laceration without foreign body of unspecified upper arm, initial encounter: Secondary | ICD-10-CM | POA: Diagnosis not present

## 2018-03-14 ENCOUNTER — Encounter: Payer: Self-pay | Admitting: Internal Medicine

## 2018-03-14 ENCOUNTER — Ambulatory Visit: Payer: PPO | Admitting: Internal Medicine

## 2018-03-14 VITALS — BP 132/56 | HR 73 | Ht 69.0 in | Wt 158.8 lb

## 2018-03-14 DIAGNOSIS — I10 Essential (primary) hypertension: Secondary | ICD-10-CM

## 2018-03-14 DIAGNOSIS — I350 Nonrheumatic aortic (valve) stenosis: Secondary | ICD-10-CM | POA: Diagnosis not present

## 2018-03-14 DIAGNOSIS — S41109A Unspecified open wound of unspecified upper arm, initial encounter: Secondary | ICD-10-CM | POA: Diagnosis not present

## 2018-03-14 NOTE — Patient Instructions (Signed)
Your physician recommends that you continue on your current medications as directed. Please refer to the Current Medication list given to you today. Your physician wants you to follow-up in: March, 2020 with Dr. Harrington Challenger.  You will receive a reminder letter in the mail two months in advance. If you don't receive a letter, please call our office to schedule the follow-up appointment.

## 2018-03-14 NOTE — Progress Notes (Signed)
Cardiology Office Note   Date:  03/14/2018   ID:  Stephen Nunez, DOB Sep 30, 1931, MRN [id number]  PCP:  Lawerance Cruel, MD  Cardiologist:   Dorris Carnes, MD   F/U of HTN and aortic stenosis      History of Present Illness: Stephen Nunez is a 84 y.o. male with a history of HTN, HLmurmur and LBBB  Myovue normal  In 2015 Echo 2018 showed normal LVEF  AS was mild   Mean gradient 15 mm LVEF 45%    I saw the pt in June 2019    BP was up   I recomm following BP closely and bringing in cuff to vist  Since seen he has done well  Breahing is OK  No CP   No dizzienss  Current Meds  Medication Sig  . acetaminophen (TYLENOL) 500 MG tablet Take 1,000 mg by mouth every 6 (six) hours as needed (for pain.).  Marland Kitchen aspirin EC 81 MG tablet Take 81 mg by mouth daily at 2 PM.  . Bioflavonoid Products (GRAPE SEED PO) Take 2 capsules by mouth daily. [place] Grape Capsules  . Cholecalciferol (VITAMIN D3) 2000 units TABS Take 2,000 Units by mouth daily.  . Coenzyme Q10 (CO Q-10) 200 MG CAPS Take 200 mg by mouth daily.  . Cyanocobalamin (VITAMIN B-12) 5000 MCG LOZG Take 5,000 mcg by mouth daily.  . Glucosamine HCl 1500 MG TABS Take 1,500 mg by mouth daily.  . Multiple Vitamin (MULTIVITAMIN WITH MINERALS) TABS tablet Take 1 tablet by mouth daily.  Marland Kitchen OVER THE COUNTER MEDICATION Apply 1 application topically 4 (four) times daily as needed (for back pain due to shingles.). CBD CREAM (HEMP)  . pravastatin (PRAVACHOL) 40 MG tablet Take 40 mg by mouth daily at 2 PM.   . quinapril (ACCUPRIL) 40 MG tablet Take 40 mg by mouth daily.  . traMADol (ULTRAM) 50 MG tablet Take 1-2 tablets (50-100 mg total) by mouth every 6 (six) hours as needed for moderate pain.  . vitamin E 400 UNIT capsule Take 400 Units by mouth daily.     Allergies:   No known allergies   Past Medical History:  Diagnosis Date  . Arthritis   . Heart murmur   . History of shingles    2000  . Hyperlipidemia   . Hypertension   .  Pneumonia    as an infant  . Post herpetic neuralgia   . Prostate cancer    prostate cancer    Past Surgical History:  Procedure Laterality Date  . COLONOSCOPY    . HERNIA REPAIR    . INGUINAL HERNIA REPAIR Right 09/07/2017   Procedure: OPEN REPAIR RIGHT INGUINAL HERNIA;  Surgeon: Armandina Gemma, MD;  [facility]: [facility];  Service: General;  Laterality: Right;  . INSERTION OF MESH Right 09/07/2017   Procedure: INSERTION OF MESH;  Surgeon: Armandina Gemma, MD;  [facility];  Service: General;  Laterality: Right;  . PROSTATE SURGERY    . SPINAL CORD STIMULATOR INSERTION N/A 04/26/2015   Procedure: LUMBAR SPINAL CORD STIMULATOR INSERTION;  Surgeon: Clydell Hakim, MD;  Location: [facility];  Service: Neurosurgery;  Laterality: N/A;  . SPINAL CORD STIMULATOR REMOVAL N/A 05/15/2016   Procedure: THORACIC SPINAL CORD STIMULATOR REMOVAL;  Surgeon: Clydell Hakim, MD;  Location: [facility];  Service: Neurosurgery;  Laterality: N/A;  THORACIC SPINAL CORD STIMULATOR REMOVAL  . TONSILLECTOMY       Social History:  The patient  reports that he has  never smoked. He has never used smokeless tobacco. He reports that he does not drink alcohol or use drugs.   Family History:  The patient's family history includes Cancer in his mother; Heart attack in his brother and father; Multiple sclerosis in his brother; Other in his mother.    ROS:  Please see the history of present illness. All other systems are reviewed and  Negative to the above problem except as noted.    PHYSICAL EXAM: VS:  BP (!) 132/56   Pulse 73   Ht 5\' 9"  (1.753 m)   Wt 158 lb 12.8 oz (72 kg)   SpO2 97%   BMI 23.45 kg/m   GEN: Well nourished, well developed, in no acute distress  HEENT: normal  Neck: no JVD, carotid bruits, or masses Cardiac: RRR; Gr III/VI mid systolic murmur heard through chest, best at base  No, rubs, or gallops,no edema  Respiratory:  clear to auscultation bilaterally, normal work of breathing GI: soft, nontender,  nondistended, + BS  No hepatomegaly  MS: no deformity Moving all extremities   Skin: warm and dry, no rash Neuro:  Strength and sensation are intact Psych: euthymic mood, full affect   EKG:  EKG is not ordered today     Lipid Panel No results found for: CHOL, TRIG, HDL, CHOLHDL, VLDL, LDLCALC, LDLDIRECT    Wt Readings from Last 3 Encounters:  03/14/18 158 lb 12.8 oz (72 kg)  12/23/17 158 lb 6.4 oz (71.8 kg)  09/07/17 157 lb 2 oz (71.3 kg)      ASSESSMENT AND PLAN:  1  Aortic stenosis Moderate by echo   WIll continue to follow clinically with periodic echoes    2  HTN  BP is better  At home ranges 110s to 150s   130 most of time   Follow    I asked him to keep log of BPs    Bring cuff to clinic  See again in AUg/Sept  3  HL  LDL 96  HDL 59   Checked in Feb 2019   Keep on same regimen    4 LBBB  Old    Encouraged him to stay active  F/U in Aug   Current medicines are reviewed at length with the patient today.  The patient does not have concerns regarding medicines.  Signed, Dorris Carnes, MD  03/14/2018 3:40 PM    [facility] [address], [place], [place]  [address] Phone: [phone]; Fax: [phone]

## 2018-03-16 DIAGNOSIS — J069 Acute upper respiratory infection, unspecified: Secondary | ICD-10-CM | POA: Diagnosis not present

## 2018-03-21 DIAGNOSIS — B0229 Other postherpetic nervous system involvement: Secondary | ICD-10-CM | POA: Diagnosis not present

## 2018-03-21 DIAGNOSIS — S41109A Unspecified open wound of unspecified upper arm, initial encounter: Secondary | ICD-10-CM | POA: Diagnosis not present

## 2018-03-22 DIAGNOSIS — H2512 Age-related nuclear cataract, left eye: Secondary | ICD-10-CM | POA: Diagnosis not present

## 2018-03-22 DIAGNOSIS — H25812 Combined forms of age-related cataract, left eye: Secondary | ICD-10-CM | POA: Diagnosis not present

## 2018-03-23 DIAGNOSIS — S41109A Unspecified open wound of unspecified upper arm, initial encounter: Secondary | ICD-10-CM | POA: Diagnosis not present

## 2018-03-28 DIAGNOSIS — S41109A Unspecified open wound of unspecified upper arm, initial encounter: Secondary | ICD-10-CM | POA: Diagnosis not present

## 2018-04-15 DIAGNOSIS — L821 Other seborrheic keratosis: Secondary | ICD-10-CM | POA: Diagnosis not present

## 2018-04-15 DIAGNOSIS — I8392 Asymptomatic varicose veins of left lower extremity: Secondary | ICD-10-CM | POA: Diagnosis not present

## 2018-04-15 DIAGNOSIS — D692 Other nonthrombocytopenic purpura: Secondary | ICD-10-CM | POA: Diagnosis not present

## 2018-04-15 DIAGNOSIS — I8391 Asymptomatic varicose veins of right lower extremity: Secondary | ICD-10-CM | POA: Diagnosis not present

## 2018-04-15 DIAGNOSIS — B0229 Other postherpetic nervous system involvement: Secondary | ICD-10-CM | POA: Diagnosis not present

## 2018-04-15 DIAGNOSIS — L853 Xerosis cutis: Secondary | ICD-10-CM | POA: Diagnosis not present

## 2018-04-19 DIAGNOSIS — B0229 Other postherpetic nervous system involvement: Secondary | ICD-10-CM | POA: Diagnosis not present

## 2018-04-19 DIAGNOSIS — I10 Essential (primary) hypertension: Secondary | ICD-10-CM | POA: Diagnosis not present

## 2018-04-27 DIAGNOSIS — H4921 Sixth [abducent] nerve palsy, right eye: Secondary | ICD-10-CM | POA: Diagnosis not present

## 2018-05-24 DIAGNOSIS — H4921 Sixth [abducent] nerve palsy, right eye: Secondary | ICD-10-CM | POA: Diagnosis not present

## 2018-05-27 DIAGNOSIS — R197 Diarrhea, unspecified: Secondary | ICD-10-CM | POA: Diagnosis not present

## 2018-06-01 DIAGNOSIS — R197 Diarrhea, unspecified: Secondary | ICD-10-CM | POA: Diagnosis not present

## 2018-06-08 DIAGNOSIS — R159 Full incontinence of feces: Secondary | ICD-10-CM | POA: Diagnosis not present

## 2018-06-08 DIAGNOSIS — R152 Fecal urgency: Secondary | ICD-10-CM | POA: Diagnosis not present

## 2018-06-13 DIAGNOSIS — B0229 Other postherpetic nervous system involvement: Secondary | ICD-10-CM | POA: Diagnosis not present

## 2018-07-22 DIAGNOSIS — R634 Abnormal weight loss: Secondary | ICD-10-CM | POA: Diagnosis not present

## 2018-07-22 DIAGNOSIS — R159 Full incontinence of feces: Secondary | ICD-10-CM | POA: Diagnosis not present

## 2018-07-22 DIAGNOSIS — R152 Fecal urgency: Secondary | ICD-10-CM | POA: Diagnosis not present

## 2018-07-28 DIAGNOSIS — B078 Other viral warts: Secondary | ICD-10-CM | POA: Diagnosis not present

## 2018-07-28 DIAGNOSIS — L821 Other seborrheic keratosis: Secondary | ICD-10-CM | POA: Diagnosis not present

## 2018-10-20 DIAGNOSIS — B0229 Other postherpetic nervous system involvement: Secondary | ICD-10-CM | POA: Diagnosis not present

## 2018-11-22 DIAGNOSIS — B0229 Other postherpetic nervous system involvement: Secondary | ICD-10-CM | POA: Diagnosis not present

## 2018-12-21 DIAGNOSIS — Z0000 Encounter for general adult medical examination without abnormal findings: Secondary | ICD-10-CM | POA: Diagnosis not present

## 2018-12-21 DIAGNOSIS — E7800 Pure hypercholesterolemia, unspecified: Secondary | ICD-10-CM | POA: Diagnosis not present

## 2018-12-21 DIAGNOSIS — Z8546 Personal history of malignant neoplasm of prostate: Secondary | ICD-10-CM | POA: Diagnosis not present

## 2018-12-21 DIAGNOSIS — I10 Essential (primary) hypertension: Secondary | ICD-10-CM | POA: Diagnosis not present

## 2018-12-21 DIAGNOSIS — R197 Diarrhea, unspecified: Secondary | ICD-10-CM | POA: Diagnosis not present

## 2018-12-21 DIAGNOSIS — M25569 Pain in unspecified knee: Secondary | ICD-10-CM | POA: Diagnosis not present

## 2018-12-26 DIAGNOSIS — E7800 Pure hypercholesterolemia, unspecified: Secondary | ICD-10-CM | POA: Diagnosis not present

## 2018-12-26 DIAGNOSIS — N183 Chronic kidney disease, stage 3 (moderate): Secondary | ICD-10-CM | POA: Diagnosis not present

## 2018-12-26 DIAGNOSIS — R251 Tremor, unspecified: Secondary | ICD-10-CM | POA: Diagnosis not present

## 2018-12-26 DIAGNOSIS — Z0000 Encounter for general adult medical examination without abnormal findings: Secondary | ICD-10-CM | POA: Diagnosis not present

## 2018-12-26 DIAGNOSIS — B0229 Other postherpetic nervous system involvement: Secondary | ICD-10-CM | POA: Diagnosis not present

## 2018-12-26 DIAGNOSIS — I10 Essential (primary) hypertension: Secondary | ICD-10-CM | POA: Diagnosis not present

## 2018-12-27 DIAGNOSIS — R152 Fecal urgency: Secondary | ICD-10-CM | POA: Diagnosis not present

## 2018-12-27 DIAGNOSIS — R634 Abnormal weight loss: Secondary | ICD-10-CM | POA: Diagnosis not present

## 2018-12-27 DIAGNOSIS — R159 Full incontinence of feces: Secondary | ICD-10-CM | POA: Diagnosis not present

## 2019-01-13 ENCOUNTER — Telehealth: Payer: Self-pay

## 2019-01-16 NOTE — Telephone Encounter (Signed)
Spoke with pt's wife. Pt just had a physical. And he wants to discuss that his tremors have gotten a little worse and his PCP would like pt to discuss with Dr. Harrington Challenger. - may or may not want to prescribe a medicine for this.  Will request last ov note from PCP office via Chart Prep.

## 2019-01-18 DIAGNOSIS — I8391 Asymptomatic varicose veins of right lower extremity: Secondary | ICD-10-CM | POA: Diagnosis not present

## 2019-01-18 DIAGNOSIS — D692 Other nonthrombocytopenic purpura: Secondary | ICD-10-CM | POA: Diagnosis not present

## 2019-01-18 DIAGNOSIS — L821 Other seborrheic keratosis: Secondary | ICD-10-CM | POA: Diagnosis not present

## 2019-01-18 DIAGNOSIS — L858 Other specified epidermal thickening: Secondary | ICD-10-CM | POA: Diagnosis not present

## 2019-01-19 DIAGNOSIS — B0229 Other postherpetic nervous system involvement: Secondary | ICD-10-CM | POA: Diagnosis not present

## 2019-01-27 ENCOUNTER — Telehealth: Payer: Self-pay

## 2019-01-27 NOTE — Telephone Encounter (Signed)

## 2019-01-28 NOTE — Progress Notes (Signed)
Cardiology Office Note   Date:  01/30/2019   ID:  Stephen Nunez, DOB 1932-01-24, MRN [id number]  PCP:  Stephen Cruel, MD  Cardiologist:   Stephen Carnes, MD   F/U of HTN     History of Present Illness: Stephen Nunez is a 85 y.o. male with a history of HTN, HL, mild AS and LBBB   Myovue in 2015 was normal   Echo in 2018 :VEF 45%   Mean gradient across AV 15 mm Hg    I last saw the pt in June 2019    The pt deneis dizzienss  Breathing is OK  Denies CP     Does complain of chronic tremor with arms   See Dr Stephen Nunez   Considering medical Rx    Current Meds  Medication Sig  . acetaminophen (TYLENOL) 500 MG tablet Take 1,000 mg by mouth every 6 (six) hours as needed (for pain.).  Marland Kitchen aspirin EC 81 MG tablet Take 81 mg by mouth daily at 2 PM.  . Bioflavonoid Products (GRAPE SEED PO) Take 2 capsules by mouth daily. [place] Grape Capsules  . Cholecalciferol (VITAMIN D3) 2000 units TABS Take 2,000 Units by mouth daily.  . Coenzyme Q10 (CO Q-10) 200 MG CAPS Take 200 mg by mouth daily.  . Cyanocobalamin (VITAMIN B-12) 5000 MCG LOZG Take 5,000 mcg by mouth daily.  . Glucosamine HCl 1500 MG TABS Take 1,500 mg by mouth daily.  . Multiple Vitamin (MULTIVITAMIN WITH MINERALS) TABS tablet Take 1 tablet by mouth daily.  Marland Kitchen OVER THE COUNTER MEDICATION Apply 1 application topically 4 (four) times daily as needed (for back pain due to shingles.). CBD CREAM (HEMP)  . pravastatin (PRAVACHOL) 40 MG tablet Take 40 mg by mouth daily at 2 PM.   . quinapril (ACCUPRIL) 40 MG tablet Take 40 mg by mouth daily.  . traMADol (ULTRAM) 50 MG tablet Take 1-2 tablets (50-100 mg total) by mouth every 6 (six) hours as needed for moderate pain.  . vitamin E 400 UNIT capsule Take 400 Units by mouth daily.     Allergies:   No known allergies   Past Medical History:  Diagnosis Date  . Arthritis   . Heart murmur   . History of shingles    2000  . Hyperlipidemia   . Hypertension   . Pneumonia    as an infant   . Post herpetic neuralgia   . Prostate cancer    prostate cancer    Past Surgical History:  Procedure Laterality Date  . COLONOSCOPY    . HERNIA REPAIR    . INGUINAL HERNIA REPAIR Right 09/07/2017   Procedure: OPEN REPAIR RIGHT INGUINAL HERNIA;  Surgeon: Armandina Gemma, MD;  [facility];  Service: General;  Laterality: Right;  . INSERTION OF MESH Right 09/07/2017   Procedure: INSERTION OF MESH;  Surgeon: Armandina Gemma, MD;  [facility];  Service: General;  Laterality: Right;  . PROSTATE SURGERY    . SPINAL CORD STIMULATOR INSERTION N/A 04/26/2015   Procedure: LUMBAR SPINAL CORD STIMULATOR INSERTION;  Surgeon: Clydell Hakim, MD;  Location: [facility];  Service: Neurosurgery;  Laterality: N/A;  . SPINAL CORD STIMULATOR REMOVAL N/A 05/15/2016   Procedure: THORACIC SPINAL CORD STIMULATOR REMOVAL;  Surgeon: Clydell Hakim, MD;  Location: [facility];  Service: Neurosurgery;  Laterality: N/A;  THORACIC SPINAL CORD STIMULATOR REMOVAL  . TONSILLECTOMY       Social History:  The patient  reports that he has never  smoked. He has never used smokeless tobacco. He reports that he does not drink alcohol or use drugs.   Family History:  The patient's family history includes Cancer in his mother; Heart attack in his brother and father; Multiple sclerosis in his brother; Other in his mother.    ROS:  Please see the history of present illness. All other systems are reviewed and  Negative to the above problem except as noted.    PHYSICAL EXAM: VS:  BP 139/75   Pulse 89   Ht 5\' 9"  (1.753 m)   Wt 154 lb 6.4 oz (70 kg)   BMI 22.80 kg/m   GEN: Well nourished, well developed, in no acute distress  HEENT: normal  Neck: no JVD, carotid bruits, or masses Cardiac: RRR; Gr II/VI systold, rubs, or gallops,no edema  Respiratory:  clear to auscultation bilaterally, normal work of breathing GI: soft, nontender, nondistended, + BS  No hepatomegaly  MS: no deformity Moving all extremities   Skin: warm and  dry, no rash Neuro:  Rest tremor arms/hands   Otherwise derred   Psych: euthymic mood, full affect   EKG:  EKG is ordered today.   Lipid Panel No results found for: CHOL, TRIG, HDL, CHOLHDL, VLDL, LDLCALC, LDLDIRECT    Wt Readings from Last 3 Encounters:  01/30/19 154 lb 6.4 oz (70 kg)  03/14/18 158 lb 12.8 oz (72 kg)  12/23/17 158 lb 6.4 oz (71.8 kg)      ASSESSMENT AND PLAN:  1  HTN   BP is fair   Keep on same regimen  2  Hx mild AS   Will repeat echo    3  LBBB  Chronic   Normal myovue 2015     4  Tremor  Should be OK to start Rx   Iwill review echo first, make sure it hs not progressed significantly    5  HL  Keep on pravastatin  Current medicines are reviewed at length with the patient today.  The patient does not have concerns regarding medicines.  Signed, Stephen Carnes, MD  01/30/2019 11:34 AM    [facility] [place], [place], [place]  [address] Phone: [phone]; Fax: [phone]

## 2019-01-30 ENCOUNTER — Other Ambulatory Visit: Payer: Self-pay

## 2019-01-30 ENCOUNTER — Encounter: Payer: Self-pay | Admitting: Internal Medicine

## 2019-01-30 ENCOUNTER — Ambulatory Visit: Payer: PPO | Admitting: Internal Medicine

## 2019-01-30 VITALS — BP 139/75 | HR 89 | Ht 69.0 in | Wt 154.4 lb

## 2019-01-30 DIAGNOSIS — I350 Nonrheumatic aortic (valve) stenosis: Secondary | ICD-10-CM | POA: Diagnosis not present

## 2019-01-30 NOTE — Patient Instructions (Signed)
Medication Instructions:  Your physician recommends that you continue on your current medications as directed. Please refer to the Current Medication list given to you today.  If you need a refill on your cardiac medications before your next appointment, please call your pharmacy.   Lab work: None If you have labs (blood work) drawn today and your tests are completely normal, you will receive your results only by: Marland Kitchen MyChart Message (if you have MyChart) OR . A paper copy in the mail If you have any lab test that is abnormal or we need to change your treatment, we will call you to review the results.  Testing/Procedures: Your physician has requested that you have an echocardiogram. Echocardiography is a painless test that uses sound waves to create images of your heart. It provides your doctor with information about the size and shape of your heart and how well your heart's chambers and valves are working. This procedure takes approximately one hour. There are no restrictions for this procedure.   Follow-Up: At [facility], you and your health needs are our priority.  As part of our continuing mission to provide you with exceptional heart care, we have created designated Provider Care Teams.  These Care Teams include your primary Cardiologist (physician) and Advanced Practice Providers (APPs -  Physician Assistants and Nurse Practitioners) who all work together to provide you with the care you need, when you need it. You will need a follow up appointment in:  12 months.  Please call our office 2 months in advance to schedule this appointment.  You may see Dorris Carnes, MD or one of the following Advanced Practice Providers on your designated Care Team: Richardson Dopp, PA-C [place], [place] . Daune Perch, NP  Any Other Special Instructions Will Be Listed Below (If Applicable).

## 2019-02-08 ENCOUNTER — Ambulatory Visit (HOSPITAL_COMMUNITY): Payer: PPO | Attending: Internal Medicine

## 2019-02-08 ENCOUNTER — Other Ambulatory Visit: Payer: Self-pay

## 2019-02-08 DIAGNOSIS — I350 Nonrheumatic aortic (valve) stenosis: Secondary | ICD-10-CM | POA: Insufficient documentation

## 2019-02-21 DIAGNOSIS — B0229 Other postherpetic nervous system involvement: Secondary | ICD-10-CM | POA: Diagnosis not present

## 2019-03-07 DIAGNOSIS — Z23 Encounter for immunization: Secondary | ICD-10-CM | POA: Diagnosis not present

## 2019-04-24 DIAGNOSIS — B0229 Other postherpetic nervous system involvement: Secondary | ICD-10-CM | POA: Diagnosis not present

## 2019-04-25 DIAGNOSIS — R251 Tremor, unspecified: Secondary | ICD-10-CM | POA: Diagnosis not present

## 2019-05-22 DIAGNOSIS — B0229 Other postherpetic nervous system involvement: Secondary | ICD-10-CM | POA: Diagnosis not present

## 2019-06-13 ENCOUNTER — Telehealth: Payer: Self-pay

## 2019-06-13 NOTE — Telephone Encounter (Signed)
I called pt to r/s his appt since Dr. Rexene Alberts is out of the office tomorrow. No answer, left a message asking him to call me back. If pt calls back please r/s him. He may see any MD since he is a new pt.

## 2019-06-14 ENCOUNTER — Ambulatory Visit: Payer: PPO | Admitting: Neurology

## 2019-07-03 ENCOUNTER — Encounter: Payer: Self-pay | Admitting: *Deleted

## 2019-07-03 ENCOUNTER — Other Ambulatory Visit: Payer: Self-pay | Admitting: *Deleted

## 2019-07-04 ENCOUNTER — Encounter: Payer: Self-pay | Admitting: Diagnostic Neuroimaging

## 2019-07-04 ENCOUNTER — Ambulatory Visit: Payer: PPO | Admitting: Diagnostic Neuroimaging

## 2019-07-04 ENCOUNTER — Other Ambulatory Visit: Payer: Self-pay

## 2019-07-04 VITALS — BP 143/77 | HR 86 | Temp 97.0°F | Ht 70.0 in | Wt 166.6 lb

## 2019-07-04 DIAGNOSIS — G250 Essential tremor: Secondary | ICD-10-CM | POA: Diagnosis not present

## 2019-07-04 MED ORDER — PRIMIDONE 50 MG PO TABS: 25.0000 mg | ORAL_TABLET | Freq: Two times a day (BID) | ORAL | 3 refills | Status: DC

## 2019-07-04 NOTE — Progress Notes (Signed)
[facility]  PATIENT: Stephen Nunez DOB: 06/10/1932  REFERRING CLINICIAN: Lawerance Cruel, MD HISTORY FROM: patient and wife  REASON FOR VISIT: new consult    HISTORICAL  CHIEF COMPLAINT:  Chief Complaint  Patient presents with  . Tremors    rm 7 New Pt wife- Bethena Roys, "shingles Sept 2000, tremors in left hand/arm- getting worse over the years"    HISTORY OF PRESENT ILLNESS:   86 year old male here for evaluation of tremor.  Patient has had left arm tremor since 2014.  Symptoms occur mainly with holding objects and certain actions.  He has mild tremor in his right arm as well.  No family history of tremor.  No head tremor.  No resting tremor.  No change in speech or swallowing.  He has some mild balance difficulty.  Patient has been tried on propranolol 80 mg daily, increase to 100 mg daily, for 1 to 2 months without relief.  Patient has stopped this medication for past few weeks.  Patient has history of severe herpes zoster affecting the left thoracic dermatomes T4-T12, and left upper arm in 2000.  He struggled with chronic pain related to this.  He was treated with variety of medications including pain stimulator implantation.   REVIEW OF SYSTEMS: Full 14 system review of systems performed and negative with exception of: As per HPI.  ALLERGIES: Allergies  Allergen Reactions  . No Known Allergies     HOME MEDICATIONS: Outpatient Medications Prior to Visit  Medication Sig Dispense Refill  . acetaminophen (TYLENOL) 500 MG tablet Take 1,000 mg by mouth every 6 (six) hours as needed (for pain.).    Marland Kitchen aspirin EC 81 MG tablet Take 81 mg by mouth daily at 2 PM.    . Bioflavonoid Products (GRAPE SEED PO) Take 2 capsules by mouth daily. [place] Grape Capsules    . Cholecalciferol (VITAMIN D3) 2000 units TABS Take 2,000 Units by mouth daily.    . Coenzyme Q10 (CO Q-10) 200 MG CAPS Take 200 mg by mouth daily.    . Cyanocobalamin (VITAMIN B-12) 5000 MCG LOZG  Take 5,000 mcg by mouth daily.    . Glucosamine HCl 1500 MG TABS Take 1,500 mg by mouth daily.    . Multiple Vitamin (MULTIVITAMIN WITH MINERALS) TABS tablet Take 1 tablet by mouth daily.    Marland Kitchen OVER THE COUNTER MEDICATION Apply 1 application topically 4 (four) times daily as needed (for back pain due to shingles.). CBD CREAM (HEMP)    . pravastatin (PRAVACHOL) 40 MG tablet Take 40 mg by mouth daily at 2 PM.     . propranolol ER (INDERAL LA) 80 MG 24 hr capsule Take 160 mg by mouth daily.    . quinapril (ACCUPRIL) 40 MG tablet Take 40 mg by mouth daily.    . traMADol (ULTRAM) 50 MG tablet Take 1-2 tablets (50-100 mg total) by mouth every 6 (six) hours as needed for moderate pain. 15 tablet 0  . vitamin E 400 UNIT capsule Take 400 Units by mouth daily.     No facility-administered medications prior to visit.    PAST MEDICAL HISTORY: Past Medical History:  Diagnosis Date  . Aortic stenosis   . Arthritis   . Bell's palsy    mild, left  . Heart murmur   . History of shingles    2000  . Hyperlipidemia   . Hypertension   . OSA (obstructive sleep apnea)   . Pneumonia    as an infant  .  Post herpetic neuralgia   . Prostate cancer    prostate cancer  . Tremor     PAST SURGICAL HISTORY: Past Surgical History:  Procedure Laterality Date  . COLONOSCOPY    . HERNIA REPAIR    . INGUINAL HERNIA REPAIR Right 09/07/2017   Procedure: OPEN REPAIR RIGHT INGUINAL HERNIA;  Surgeon: Armandina Gemma, MD;  Location: [facility];  Service: General;  Laterality: Right;  . INSERTION OF MESH Right 09/07/2017   Procedure: INSERTION OF MESH;  Surgeon: Armandina Gemma, MD;  Location: [facility];  Service: General;  Laterality: Right;  . PROSTATE SURGERY    . SPINAL CORD STIMULATOR INSERTION N/A 04/26/2015   Procedure: LUMBAR SPINAL CORD STIMULATOR INSERTION;  Surgeon: Clydell Hakim, MD;  Location: [place] [facility];  Service: Neurosurgery;  Laterality: N/A;  . SPINAL CORD STIMULATOR REMOVAL N/A 05/15/2016   Procedure:  THORACIC SPINAL CORD STIMULATOR REMOVAL;  Surgeon: Clydell Hakim, MD;  Location: [facility];  Service: Neurosurgery;  Laterality: N/A;  THORACIC SPINAL CORD STIMULATOR REMOVAL  . TONSILLECTOMY      FAMILY HISTORY: Family History  Problem Relation Age of Onset  . Heart attack Father   . Other Mother        Post-op complications, deceased 63  . Cancer Mother   . Heart attack Brother        Deceased 77  . Stroke Brother   . Multiple sclerosis Brother        Deceased, 59    SOCIAL HISTORY: Social History   Socioeconomic History  . Marital status: Married    Spouse name: Bethena Roys  . Number of children: 0  . Years of education: Not on file  . Highest education level: Not on file  Occupational History  . Occupation: retired  Tobacco Use  . Smoking status: Never Smoker  . Smokeless tobacco: Never Used  Substance and Sexual Activity  . Alcohol use: No  . Drug use: No  . Sexual activity: Not on file  Other Topics Concern  . Not on file  Social History Narrative   He lives with wife.   Retired from working in [employer] fundraiser.   No caffeine         Social Determinants of Health   Financial Resource Strain:   . Difficulty of Paying Living Expenses: Not on file  Food Insecurity:   . Worried About Charity fundraiser in the Last Year: Not on file  . Ran Out of Food in the Last Year: Not on file  Transportation Needs:   . Lack of Transportation (Medical): Not on file  . Lack of Transportation (Non-Medical): Not on file  Physical Activity:   . Days of Exercise per Week: Not on file  . Minutes of Exercise per Session: Not on file  Stress:   . Feeling of Stress : Not on file  Social Connections:   . Frequency of Communication with Friends and Family: Not on file  . Frequency of Social Gatherings with Friends and Family: Not on file  . Attends Religious Services: Not on file  . Active Member of Clubs or Organizations: Not on file  . Attends Archivist Meetings: Not on file  .  Marital Status: Not on file  Intimate Partner Violence:   . Fear of Current or Ex-Partner: Not on file  . Emotionally Abused: Not on file  . Physically Abused: Not on file  . Sexually Abused: Not on file     PHYSICAL EXAM  GENERAL EXAM/CONSTITUTIONAL: Vitals:  Vitals:   07/04/19 1242  BP: (!) 143/77  Pulse: 86  Temp: (!) 97 F (36.1 C)  Weight: 166 lb 9.6 oz (75.6 kg)  Height: 5\' 10"  (1.778 m)     Body mass index is 23.9 kg/m. Wt Readings from Last 3 Encounters:  07/04/19 166 lb 9.6 oz (75.6 kg)  01/30/19 154 lb 6.4 oz (70 kg)  03/14/18 158 lb 12.8 oz (72 kg)     Patient is in no distress; well developed, nourished and groomed; neck is supple  CARDIOVASCULAR:  Regular rate and rhythm; SYSTOLIC MURMUR RADIATING TO RIGHT CAROTID  Examination of peripheral vascular system by observation and palpation is normal  EYES:  Ophthalmoscopic exam of optic discs and posterior segments is normal; no papilledema or hemorrhages  No exam data present  MUSCULOSKELETAL:  Gait, strength, tone, movements noted in Neurologic exam below  NEUROLOGIC: MENTAL STATUS:  No flowsheet data found.  awake, alert, oriented to person, place and time  recent and remote memory intact  normal attention and concentration  language fluent, comprehension intact, naming intact  fund of knowledge appropriate  CRANIAL NERVE:   2nd - no papilledema on fundoscopic exam  2nd, 3rd, 4th, 6th - pupils equal and reactive to light, visual fields full to confrontation, extraocular muscles intact, no nystagmus  5th - facial sensation symmetric  7th - facial strength symmetric  8th - hearing intact  9th - palate elevates symmetrically, uvula midline  11th - shoulder shrug symmetric  12th - tongue protrusion midline  MOTOR:   MODERATE POSTURAL AND ACTION TREMOR IN BUE (LEFT > RIGHT)  NO BRADYKINESIA  NO RIGIDITY  normal bulk and tone, full strength in the BUE, BLE  SENSORY:    normal and symmetric to light touch, temperature, vibration  COORDINATION:   finger-nose-finger, fine finger movements normal  REFLEXES:   deep tendon reflexes 1+ and symmetric  GAIT/STATION:   STOOPED POSTURE; SLOW AND SLIGHTLY UNSTEADY    DIAGNOSTIC DATA (LABS, IMAGING, TESTING) - I reviewed patient records, labs, notes, testing and imaging myself where available.  Lab Results  Component Value Date   WBC 6.3 09/02/2017   HGB 13.0 09/02/2017   HCT 40.7 09/02/2017   MCV 99.8 09/02/2017   PLT 184 09/02/2017      Component Value Date/Time   NA 139 09/02/2017 1117   K 5.4 (H) 09/02/2017 1117   CL 104 09/02/2017 1117   CO2 27 09/02/2017 1117   GLUCOSE 104 (H) 09/02/2017 1117   BUN 45 (H) 09/02/2017 1117   CREATININE 1.26 (H) 09/02/2017 1117   CALCIUM 9.5 09/02/2017 1117   GFRNONAA 51 (L) 09/02/2017 1117   GFRAA 59 (L) 09/02/2017 1117   No results found for: CHOL, HDL, LDLCALC, LDLDIRECT, TRIG, CHOLHDL No results found for: HGBA1C No results found for: VITAMINB12 No results found for: TSH   02/08/19 TTE 1. The left ventricle has low normal systolic function, with an ejection fraction of 50-55%. The cavity size was normal. There is moderate concentric left ventricular hypertrophy. Left ventricular diastolic Doppler parameters are consistent with  impaired relaxation. There is abnormal septal motion consistent with left bundle branch block. No evidence of left ventricular regional wall motion abnormalities.  2. The right ventricle has normal systolic function. The cavity was normal. There is no increase in right ventricular wall thickness. Right ventricular systolic pressure is mildly elevated with an estimated pressure of 38.9 mmHg.  3. There is mild mitral annular calcification present.  4. Tricuspid valve regurgitation  is mild-moderate.  5. The aortic valve is tricuspid. Severely thickening of the aortic valve. Moderate calcification of the aortic valve. Aortic valve  regurgitation is mild to moderate by color flow Doppler. Moderate stenosis of the aortic valve.  6. The aorta is normal unless otherwise noted.  7. There is redundancy of the interatrial septum.    ASSESSMENT AND PLAN  86 y.o. year old male here with postural and action tremor since 2014, gradually worsening over time.  Most likely represents essential tremor.  Tried and failed propranolol.  We will try low-dose primidone.  Dx:  1. Essential tremor     PLAN:  - primidone 25mg  at bedtime; increase to 25mg  twice a day after 1 week; then may increase to 50mg  twice a day  Meds ordered this encounter  Medications  . primidone (MYSOLINE) 50 MG tablet    Sig: Take 0.5-1 tablets (25-50 mg total) by mouth 2 (two) times daily.    Dispense:  60 tablet    Refill:  3   Return in about 6 months (around 01/01/2020).    Penni Bombard, MD [id number], [id number] PM Certified in Neurology, Neurophysiology and Neuroimaging  [facility] [address] [place], [place] [address] [phone]

## 2019-07-04 NOTE — Patient Instructions (Signed)
  ESSENTIAL TREMOR - primidone 25mg  at bedtime; increase to 25mg  twice a day after 1 week; then may increase to 50mg  twice a day

## 2019-07-19 ENCOUNTER — Telehealth: Payer: Self-pay | Admitting: Diagnostic Neuroimaging

## 2019-07-19 MED ORDER — PRIMIDONE 50 MG PO TABS: 25.0000 mg | ORAL_TABLET | Freq: Two times a day (BID) | ORAL | 3 refills | Status: DC

## 2019-07-19 NOTE — Telephone Encounter (Signed)
Kia from [facility] called stating that the pt has switched over to [facility] and is needing a refill on his primidone (MYSOLINE) 50 MG tablet sent in to [facility]

## 2019-07-19 NOTE — Telephone Encounter (Signed)
Primidone d/c to [facility], reordered per previous Rx to [facility].

## 2019-07-20 DIAGNOSIS — N183 Chronic kidney disease, stage 3 unspecified: Secondary | ICD-10-CM | POA: Diagnosis not present

## 2019-07-20 DIAGNOSIS — I10 Essential (primary) hypertension: Secondary | ICD-10-CM | POA: Diagnosis not present

## 2019-07-20 DIAGNOSIS — Z8546 Personal history of malignant neoplasm of prostate: Secondary | ICD-10-CM | POA: Diagnosis not present

## 2019-07-20 DIAGNOSIS — E7800 Pure hypercholesterolemia, unspecified: Secondary | ICD-10-CM | POA: Diagnosis not present

## 2019-07-25 ENCOUNTER — Ambulatory Visit: Payer: PPO

## 2019-08-05 ENCOUNTER — Ambulatory Visit: Payer: PPO | Attending: Internal Medicine

## 2019-08-05 DIAGNOSIS — Z23 Encounter for immunization: Secondary | ICD-10-CM

## 2019-08-05 NOTE — Progress Notes (Signed)
   Covid-19 Vaccination Clinic  Name:  Stephen Nunez    MRN: [id number] DOB: 1931/08/23  08/05/2019  [name] was observed post Covid-19 immunization for 15 minutes without incidence. He was provided with Vaccine Information Sheet and instruction to access the V-Safe system.   [name] was instructed to call 911 with any severe reactions post vaccine: Marland Kitchen Difficulty breathing  . Swelling of your face and throat  . A fast heartbeat  . A bad rash all over your body  . Dizziness and weakness    Immunizations Administered    Name Date Dose VIS Date Route   Pfizer COVID-19 Vaccine 08/05/2019  8:45 AM 0.3 mL 06/09/2019 Intramuscular   Manufacturer: [place]   Lot: [id number]   [place]: [id number]

## 2019-08-19 DIAGNOSIS — M5430 Sciatica, unspecified side: Secondary | ICD-10-CM | POA: Diagnosis not present

## 2019-08-19 DIAGNOSIS — M25552 Pain in left hip: Secondary | ICD-10-CM | POA: Diagnosis not present

## 2019-08-29 ENCOUNTER — Ambulatory Visit: Payer: PPO

## 2019-08-29 ENCOUNTER — Ambulatory Visit: Payer: PPO | Attending: Internal Medicine

## 2019-08-29 DIAGNOSIS — Z23 Encounter for immunization: Secondary | ICD-10-CM | POA: Insufficient documentation

## 2019-08-29 NOTE — Progress Notes (Signed)
   Covid-19 Vaccination Clinic  Name:  Stephen Nunez    MRN: [id number] DOB: 1932/04/22  08/29/2019  [name] was observed post Covid-19 immunization for 15 minutes without incident. He was provided with Vaccine Information Sheet and instruction to access the V-Safe system.   [name] was instructed to call 911 with any severe reactions post vaccine: Marland Kitchen Difficulty breathing  . Swelling of face and throat  . A fast heartbeat  . A bad rash all over body  . Dizziness and weakness   Immunizations Administered    Name Date Dose VIS Date Route   Pfizer COVID-19 Vaccine 08/29/2019  9:23 AM 0.3 mL 06/09/2019 Intramuscular   Manufacturer: [place]   Lot: [id number]   [place]: [id number]

## 2019-08-30 DIAGNOSIS — M21162 Varus deformity, not elsewhere classified, left knee: Secondary | ICD-10-CM | POA: Diagnosis not present

## 2019-08-30 DIAGNOSIS — M1712 Unilateral primary osteoarthritis, left knee: Secondary | ICD-10-CM | POA: Diagnosis not present

## 2019-09-14 DIAGNOSIS — B0229 Other postherpetic nervous system involvement: Secondary | ICD-10-CM | POA: Diagnosis not present

## 2019-09-26 DIAGNOSIS — I10 Essential (primary) hypertension: Secondary | ICD-10-CM | POA: Diagnosis not present

## 2019-09-26 DIAGNOSIS — N183 Chronic kidney disease, stage 3 unspecified: Secondary | ICD-10-CM | POA: Diagnosis not present

## 2019-09-26 DIAGNOSIS — Z8546 Personal history of malignant neoplasm of prostate: Secondary | ICD-10-CM | POA: Diagnosis not present

## 2019-09-26 DIAGNOSIS — E7800 Pure hypercholesterolemia, unspecified: Secondary | ICD-10-CM | POA: Diagnosis not present

## 2019-10-17 DIAGNOSIS — I10 Essential (primary) hypertension: Secondary | ICD-10-CM | POA: Diagnosis not present

## 2019-10-17 DIAGNOSIS — N183 Chronic kidney disease, stage 3 unspecified: Secondary | ICD-10-CM | POA: Diagnosis not present

## 2019-10-17 DIAGNOSIS — Z8546 Personal history of malignant neoplasm of prostate: Secondary | ICD-10-CM | POA: Diagnosis not present

## 2019-10-17 DIAGNOSIS — E7800 Pure hypercholesterolemia, unspecified: Secondary | ICD-10-CM | POA: Diagnosis not present

## 2019-10-18 DIAGNOSIS — B0229 Other postherpetic nervous system involvement: Secondary | ICD-10-CM | POA: Diagnosis not present

## 2019-10-18 DIAGNOSIS — M25562 Pain in left knee: Secondary | ICD-10-CM | POA: Diagnosis not present

## 2019-10-18 DIAGNOSIS — R030 Elevated blood-pressure reading, without diagnosis of hypertension: Secondary | ICD-10-CM | POA: Diagnosis not present

## 2019-12-19 DIAGNOSIS — B0229 Other postherpetic nervous system involvement: Secondary | ICD-10-CM | POA: Diagnosis not present

## 2019-12-29 DIAGNOSIS — E7800 Pure hypercholesterolemia, unspecified: Secondary | ICD-10-CM | POA: Diagnosis not present

## 2019-12-29 DIAGNOSIS — I10 Essential (primary) hypertension: Secondary | ICD-10-CM | POA: Diagnosis not present

## 2019-12-29 DIAGNOSIS — Z8546 Personal history of malignant neoplasm of prostate: Secondary | ICD-10-CM | POA: Diagnosis not present

## 2019-12-29 DIAGNOSIS — N183 Chronic kidney disease, stage 3 unspecified: Secondary | ICD-10-CM | POA: Diagnosis not present

## 2020-01-02 ENCOUNTER — Ambulatory Visit: Payer: PPO | Admitting: Diagnostic Neuroimaging

## 2020-01-12 DIAGNOSIS — L570 Actinic keratosis: Secondary | ICD-10-CM | POA: Diagnosis not present

## 2020-01-12 DIAGNOSIS — L821 Other seborrheic keratosis: Secondary | ICD-10-CM | POA: Diagnosis not present

## 2020-01-12 DIAGNOSIS — M7981 Nontraumatic hematoma of soft tissue: Secondary | ICD-10-CM | POA: Diagnosis not present

## 2020-01-12 DIAGNOSIS — D692 Other nonthrombocytopenic purpura: Secondary | ICD-10-CM | POA: Diagnosis not present

## 2020-01-12 DIAGNOSIS — L710 Perioral dermatitis: Secondary | ICD-10-CM | POA: Diagnosis not present

## 2020-01-23 DIAGNOSIS — I868 Varicose veins of other specified sites: Secondary | ICD-10-CM | POA: Diagnosis not present

## 2020-01-23 DIAGNOSIS — E7800 Pure hypercholesterolemia, unspecified: Secondary | ICD-10-CM | POA: Diagnosis not present

## 2020-01-23 DIAGNOSIS — Z0000 Encounter for general adult medical examination without abnormal findings: Secondary | ICD-10-CM | POA: Diagnosis not present

## 2020-01-23 DIAGNOSIS — R0989 Other specified symptoms and signs involving the circulatory and respiratory systems: Secondary | ICD-10-CM | POA: Diagnosis not present

## 2020-01-23 DIAGNOSIS — Z8546 Personal history of malignant neoplasm of prostate: Secondary | ICD-10-CM | POA: Diagnosis not present

## 2020-01-23 DIAGNOSIS — I10 Essential (primary) hypertension: Secondary | ICD-10-CM | POA: Diagnosis not present

## 2020-01-24 ENCOUNTER — Other Ambulatory Visit (HOSPITAL_COMMUNITY): Payer: Self-pay | Admitting: Family Medicine

## 2020-01-24 DIAGNOSIS — R0989 Other specified symptoms and signs involving the circulatory and respiratory systems: Secondary | ICD-10-CM

## 2020-01-30 DIAGNOSIS — K625 Hemorrhage of anus and rectum: Secondary | ICD-10-CM | POA: Diagnosis not present

## 2020-02-02 ENCOUNTER — Other Ambulatory Visit: Payer: Self-pay

## 2020-02-02 ENCOUNTER — Ambulatory Visit (HOSPITAL_COMMUNITY)
Admission: RE | Admit: 2020-02-02 | Discharge: 2020-02-02 | Disposition: A | Discharge status: Home / Self Care | Payer: PPO | Source: Ambulatory Visit | Attending: Family Medicine | Admitting: Family Medicine

## 2020-02-02 DIAGNOSIS — R0989 Other specified symptoms and signs involving the circulatory and respiratory systems: Secondary | ICD-10-CM

## 2020-02-05 DIAGNOSIS — B0229 Other postherpetic nervous system involvement: Secondary | ICD-10-CM | POA: Diagnosis not present

## 2020-02-12 ENCOUNTER — Other Ambulatory Visit: Payer: Self-pay

## 2020-02-12 ENCOUNTER — Ambulatory Visit: Payer: PPO | Admitting: Internal Medicine

## 2020-02-12 ENCOUNTER — Encounter: Payer: Self-pay | Admitting: Internal Medicine

## 2020-02-12 VITALS — BP 132/60 | HR 72 | Ht 70.0 in | Wt 163.2 lb

## 2020-02-12 DIAGNOSIS — I350 Nonrheumatic aortic (valve) stenosis: Secondary | ICD-10-CM | POA: Diagnosis not present

## 2020-02-12 DIAGNOSIS — E785 Hyperlipidemia, unspecified: Secondary | ICD-10-CM

## 2020-02-12 MED ORDER — ROSUVASTATIN CALCIUM 20 MG PO TABS: 20.0000 mg | ORAL_TABLET | Freq: Every day | ORAL | 3 refills | Status: DC

## 2020-02-12 NOTE — Patient Instructions (Signed)
Medication Instructions:  Your physician has recommended you make the following change in your medication:  1.) STOP PRAVASTATIN 2.) START ROSUVASTATIN (CRESTOR) 20 MG ONCE A DAY  *If you need a refill on your cardiac medications before your next appointment, please call your pharmacy*   Lab Work: In 2 months- please return for blood work --LIPIDS  Testing/Procedures: ECHO IS DUE April 2022 Your physician has requested that you have an echocardiogram. Echocardiography is a painless test that uses sound waves to create images of your heart. It provides your doctor with information about the size and shape of your heart and how well your heart's chambers and valves are working. This procedure takes approximately one hour. There are no restrictions for this procedure.   Follow-Up: At [facility], you and your health needs are our priority.  As part of our continuing mission to provide you with exceptional heart care, we have created designated Provider Care Teams.  These Care Teams include your primary Cardiologist (physician) and Advanced Practice Providers (APPs -  Physician Assistants and Nurse Practitioners) who all work together to provide you with the care you need, when you need it.  Your next appointment:   8 month(s)--April 2022--ECHO PRIOR  The format for your next appointment:   In Person  Provider:   You may see Dorris Carnes, MD or one of the following Advanced Practice Providers on your designated Care Team:    Richardson Dopp, PA-C  [place], [place]    Other Instructions

## 2020-02-12 NOTE — Progress Notes (Signed)
Cardiology Office Note   Date:  02/12/2020   ID:  Stephen Nunez, DOB 09-17-1931, MRN [id number]  PCP:  Lawerance Cruel, MD  Cardiologist:   Dorris Carnes, MD   F/U of HTN     History of Present Illness: Stephen Nunez is a 86 y.o. male with a history of HTN, HL, mild AS and LBBB   Myovue in 2015 was normal   Echo in 2018 :VEF 45%   Mean gradient across AV 15 mm Hg    I last saw the pt in June 2019    The pt deneis dizzienss  Breathing is OK  Denies CP     Does complain of chronic tremor with arms   See Dr Harrington Challenger   Considering medical Rx   Echo on last vislt peak and mean greadient were 36 and 18 mm Hg respectivdely     SInce sen has done OK  No dizziness  NO CP  Breathing is OK    Has some L knee pain   Current Meds  Medication Sig  . acetaminophen (TYLENOL) 500 MG tablet Take 1,000 mg by mouth every 6 (six) hours as needed (for pain.).  Marland Kitchen aspirin EC 81 MG tablet Take 81 mg by mouth daily at 2 PM.  . Bioflavonoid Products (GRAPE SEED PO) Take 2 capsules by mouth daily. [place] Grape Capsules  . Cholecalciferol (VITAMIN D3) 2000 units TABS Take 2,000 Units by mouth daily.  . Coenzyme Q10 (CO Q-10) 200 MG CAPS Take 200 mg by mouth daily.  . Cyanocobalamin (VITAMIN B-12) 5000 MCG LOZG Take 5,000 mcg by mouth daily.  . Glucosamine HCl 1500 MG TABS Take 1,500 mg by mouth daily.  . Multiple Vitamin (MULTIVITAMIN WITH MINERALS) TABS tablet Take 1 tablet by mouth daily.  Marland Kitchen OVER THE COUNTER MEDICATION Apply 1 application topically 4 (four) times daily as needed (for back pain due to shingles.). CBD CREAM (HEMP)  . pravastatin (PRAVACHOL) 40 MG tablet Take 40 mg by mouth daily at 2 PM.   . quinapril (ACCUPRIL) 40 MG tablet Take 40 mg by mouth daily.  . traMADol (ULTRAM) 50 MG tablet Take 1-2 tablets (50-100 mg total) by mouth every 6 (six) hours as needed for moderate pain.  . vitamin E 400 UNIT capsule Take 400 Units by mouth daily.     Allergies:   No known allergies   Past  Medical History:  Diagnosis Date  . Aortic stenosis   . Arthritis   . Bell's palsy    mild, left  . Heart murmur   . History of shingles    2000  . Hyperlipidemia   . Hypertension   . OSA (obstructive sleep apnea)   . Pneumonia    as an infant  . Post herpetic neuralgia   . Prostate cancer    prostate cancer  . Tremor     Past Surgical History:  Procedure Laterality Date  . COLONOSCOPY    . HERNIA REPAIR    . INGUINAL HERNIA REPAIR Right 09/07/2017   Procedure: OPEN REPAIR RIGHT INGUINAL HERNIA;  Surgeon: Armandina Gemma, MD;  [facility];  Service: General;  Laterality: Right;  . INSERTION OF MESH Right 09/07/2017   Procedure: INSERTION OF MESH;  Surgeon: Armandina Gemma, MD;  [facility];  Service: General;  Laterality: Right;  . PROSTATE SURGERY    . SPINAL CORD STIMULATOR INSERTION N/A 04/26/2015   Procedure: LUMBAR SPINAL CORD STIMULATOR INSERTION;  Surgeon: Clydell Hakim, MD;  Location: [facility];  Service: Neurosurgery;  Laterality: N/A;  . SPINAL CORD STIMULATOR REMOVAL N/A 05/15/2016   Procedure: THORACIC SPINAL CORD STIMULATOR REMOVAL;  Surgeon: Clydell Hakim, MD;  Location: [facility];  Service: Neurosurgery;  Laterality: N/A;  THORACIC SPINAL CORD STIMULATOR REMOVAL  . TONSILLECTOMY       Social History:  The patient  reports that he has never smoked. He has never used smokeless tobacco. He reports that he does not drink alcohol and does not use drugs.   Family History:  The patient's family history includes Cancer in his mother; Heart attack in his brother and father; Multiple sclerosis in his brother; Other in his mother; Stroke in his brother.    ROS:  Please see the history of present illness. All other systems are reviewed and  Negative to the above problem except as noted.    PHYSICAL EXAM: VS:  BP 132/60   Pulse 72   Ht 5\' 10"  (1.778 m)   Wt 163 lb 3.2 oz (74 kg)   SpO2 95%   BMI 23.42 kg/m   GEN: Well nourished, well developed, in no acute  distress  HEENT: normal  Neck: no JVD, carotid bruits, or masses Cardiac: RRR; Gr II/VI systold early peaking systolic murmur  No LE edema  Respiratory:  clear to auscultation bilaterally,  GI: soft, nontender, nondistended, + BS  No hepatomegaly  MS: no deformity Moving all extremities   Skin: warm and dry, no rash Neuro:  Rest tremor arms/hands   Otherwise derred   Psych: euthymic mood, full affect   EKG:  EKG is ordered today.  SR 72 bpm   LBBB    ECHO (Aug 2020)  The left ventricle has low normal systolic function, with an ejection fraction of 50-55%. The cavity size was normal. There is moderate concentric left ventricular hypertrophy. Left ventricular diastolic Doppler parameters are consistent with impaired relaxation. There is abnormal septal motion consistent with left bundle branch block. No evidence of left ventricular regional wall motion abnormalities. 2. The right ventricle has normal systolic function. The cavity was normal. There is no increase in right ventricular wall thickness. Right ventricular systolic pressure is mildly elevated with an estimated pressure of 38.9 mmHg. 3. There is mild mitral annular calcification present. 4. Tricuspid valve regurgitation is mild-moderate. 5. The aortic valve is tricuspid. Severely thickening of the aortic valve. Moderate calcification of the aortic valve. Aortic valve regurgitation is mild to moderate by color flow Doppler. Moderate stenosis of the aortic valve. 6. The aorta is normal unless otherwise noted. 7. There is redundancy of the interatrial septum.  Lipid Panel No results found for: CHOL, TRIG, HDL, CHOLHDL, VLDL, LDLCALC, LDLDIRECT    Wt Readings from Last 3 Encounters:  02/12/20 163 lb 3.2 oz (74 kg)  07/04/19 166 lb 9.6 oz (75.6 kg)  01/30/19 154 lb 6.4 oz (70 kg)      ASSESSMENT AND PLAN:  1  HTN   BP is OK  Keep on current meds    2  Hx mild AS   Will repeat echo to reeval AS in the spring before I  see him in clinic   3  LBBB  Old  Normal myovue 2015     5  HL  LDL 96   WOuld switch to Crestor 20   F/U lipids in 2 months       Current medicines are reviewed at length with the patient today.  The patient does not have  concerns regarding medicines.  Signed, Dorris Carnes, MD  02/12/2020 10:28 AM    [place] [address], [place], [place]  [address] Phone: [phone]; Fax: [phone]

## 2020-02-20 DIAGNOSIS — K5730 Diverticulosis of large intestine without perforation or abscess without bleeding: Secondary | ICD-10-CM | POA: Diagnosis not present

## 2020-02-20 DIAGNOSIS — K625 Hemorrhage of anus and rectum: Secondary | ICD-10-CM | POA: Diagnosis not present

## 2020-02-29 DIAGNOSIS — L603 Nail dystrophy: Secondary | ICD-10-CM | POA: Diagnosis not present

## 2020-02-29 DIAGNOSIS — L820 Inflamed seborrheic keratosis: Secondary | ICD-10-CM | POA: Diagnosis not present

## 2020-02-29 DIAGNOSIS — L308 Other specified dermatitis: Secondary | ICD-10-CM | POA: Diagnosis not present

## 2020-02-29 DIAGNOSIS — D692 Other nonthrombocytopenic purpura: Secondary | ICD-10-CM | POA: Diagnosis not present

## 2020-03-05 ENCOUNTER — Other Ambulatory Visit: Payer: Self-pay

## 2020-03-05 DIAGNOSIS — T753XXA Motion sickness, initial encounter: Secondary | ICD-10-CM | POA: Insufficient documentation

## 2020-03-05 DIAGNOSIS — C61 Malignant neoplasm of prostate: Secondary | ICD-10-CM | POA: Insufficient documentation

## 2020-03-05 DIAGNOSIS — E7800 Pure hypercholesterolemia, unspecified: Secondary | ICD-10-CM | POA: Insufficient documentation

## 2020-03-05 DIAGNOSIS — I83899 Varicose veins of unspecified lower extremities with other complications: Secondary | ICD-10-CM

## 2020-03-05 DIAGNOSIS — M25519 Pain in unspecified shoulder: Secondary | ICD-10-CM | POA: Insufficient documentation

## 2020-03-09 DIAGNOSIS — H6123 Impacted cerumen, bilateral: Secondary | ICD-10-CM | POA: Diagnosis not present

## 2020-03-18 ENCOUNTER — Ambulatory Visit (HOSPITAL_COMMUNITY)
Admission: RE | Admit: 2020-03-18 | Discharge: 2020-03-18 | Disposition: A | Discharge status: Home / Self Care | Payer: PPO | Source: Ambulatory Visit | Attending: Surgery | Admitting: Surgery

## 2020-03-18 ENCOUNTER — Ambulatory Visit: Payer: PPO | Admitting: Physician Assistant

## 2020-03-18 ENCOUNTER — Other Ambulatory Visit: Payer: Self-pay

## 2020-03-18 VITALS — BP 148/71 | HR 66 | Temp 98.4°F | Resp 20 | Ht 70.0 in | Wt 164.8 lb

## 2020-03-18 DIAGNOSIS — I83899 Varicose veins of unspecified lower extremities with other complications: Secondary | ICD-10-CM | POA: Insufficient documentation

## 2020-03-18 DIAGNOSIS — I8393 Asymptomatic varicose veins of bilateral lower extremities: Secondary | ICD-10-CM

## 2020-03-18 NOTE — Progress Notes (Signed)
Requested by:  Lawerance Cruel, [place],  [place] [address]  Reason for consultation: Right lower extremity varicosities   History of Present Illness   Stephen Nunez is a 86 y.o. (Aug 17, 1931) male who presents for evaluation of right lower extremity varicosities.  The patient was recently evaluated by Dr. Harrington Challenger and found to have 2 areas of bulging right lower leg varicosities.  He is accompanied today by his wife.  They report that he was seen in our practice 10 to 15 years ago and believe he had sclerotherapy performed.  No prior history of known ablation.  No prior history of DVT.  He denies claudication or rest pain.  Venous symptoms include:  + Occasional  Burning .  Negative itching, swelling, bleeding, ulcer Onset/duration: Years, worsening last several months Occupation: Retired Aggravating factors: sitting.  Reads at desk several hours daily Alleviating factors: elevation Compression: Yes, has not used consistently helps:   Pain medications: None Previous vein procedures: See HPI History of DVT: No  Past Medical History:  Diagnosis Date  . Aortic stenosis   . Arthritis   . Bell's palsy    mild, left  . Heart murmur   . History of shingles    2000  . Hyperlipidemia   . Hypertension   . OSA (obstructive sleep apnea)   . Pneumonia    as an infant  . Post herpetic neuralgia   . Prostate cancer    prostate cancer  . Tremor     Past Surgical History:  Procedure Laterality Date  . COLONOSCOPY    . HERNIA REPAIR    . INGUINAL HERNIA REPAIR Right 09/07/2017   Procedure: OPEN REPAIR RIGHT INGUINAL HERNIA;  Surgeon: Armandina Gemma, MD;  Location: [facility];  Service: General;  Laterality: Right;  . INSERTION OF MESH Right 09/07/2017   Procedure: INSERTION OF MESH;  Surgeon: Armandina Gemma, MD;  Location: [facility];  Service: General;  Laterality: Right;  . PROSTATE SURGERY    . SPINAL CORD STIMULATOR INSERTION N/A 04/26/2015   Procedure: LUMBAR SPINAL  CORD STIMULATOR INSERTION;  Surgeon: Clydell Hakim, MD;  Location: [facility];  Service: Neurosurgery;  Laterality: N/A;  . SPINAL CORD STIMULATOR REMOVAL N/A 05/15/2016   Procedure: THORACIC SPINAL CORD STIMULATOR REMOVAL;  Surgeon: Clydell Hakim, MD;  Location: [facility];  Service: Neurosurgery;  Laterality: N/A;  THORACIC SPINAL CORD STIMULATOR REMOVAL  . TONSILLECTOMY      Social History   Socioeconomic History  . Marital status: Married    Spouse name: Bethena Roys  . Number of children: 0  . Years of education: Not on file  . Highest education level: Not on file  Occupational History  . Occupation: retired  Tobacco Use  . Smoking status: Never Smoker  . Smokeless tobacco: Never Used  Vaping Use  . Vaping Use: Never used  Substance and Sexual Activity  . Alcohol use: No  . Drug use: No  . Sexual activity: Not on file  Other Topics Concern  . Not on file  Social History Narrative   He lives with wife.   Retired from working in Char[employer] fundraiser.   No caffeine         Social Determinants of Health   Financial Resource Strain:   . Difficulty of Paying Living Expenses: Not on file  Food Insecurity:   . Worried About Charity fundraiser in the Last Year: Not on file  . Ran Out of Food in  the Last Year: Not on file  Transportation Needs:   . Lack of Transportation (Medical): Not on file  . Lack of Transportation (Non-Medical): Not on file  Physical Activity:   . Days of Exercise per Week: Not on file  . Minutes of Exercise per Session: Not on file  Stress:   . Feeling of Stress : Not on file  Social Connections:   . Frequency of Communication with Friends and Family: Not on file  . Frequency of Social Gatherings with Friends and Family: Not on file  . Attends Religious Services: Not on file  . Active Member of Clubs or Organizations: Not on file  . Attends Archivist Meetings: Not on file  . Marital Status: Not on file  Intimate Partner Violence:   . Fear of Current or  Ex-Partner: Not on file  . Emotionally Abused: Not on file  . Physically Abused: Not on file  . Sexually Abused: Not on file    Family History  Problem Relation Age of Onset  . Heart attack Father   . Other Mother        Post-op complications, deceased 56  . Cancer Mother   . Heart attack Brother        Deceased 55  . Stroke Brother   . Multiple sclerosis Brother        Deceased, 106    Current Outpatient Medications  Medication Sig Dispense Refill  . acetaminophen (TYLENOL) 500 MG tablet Take 1,000 mg by mouth every 6 (six) hours as needed (for pain.).    Marland Kitchen aspirin EC 81 MG tablet Take 81 mg by mouth daily at 2 PM.    . Bioflavonoid Products (GRAPE SEED PO) Take 2 capsules by mouth daily. [place] Grape Capsules    . Cholecalciferol (VITAMIN D3) 2000 units TABS Take 2,000 Units by mouth daily.    . Coenzyme Q10 (CO Q-10) 200 MG CAPS Take 200 mg by mouth daily.    . Cyanocobalamin (VITAMIN B-12) 5000 MCG LOZG Take 5,000 mcg by mouth daily.    . Glucosamine HCl 1500 MG TABS Take 1,500 mg by mouth daily.    . Multiple Vitamin (MULTIVITAMIN WITH MINERALS) TABS tablet Take 1 tablet by mouth daily.    Marland Kitchen OVER THE COUNTER MEDICATION Apply 1 application topically 4 (four) times daily as needed (for back pain due to shingles.). CBD CREAM (HEMP)    . quinapril (ACCUPRIL) 40 MG tablet Take 40 mg by mouth daily.    . rosuvastatin (CRESTOR) 20 MG tablet Take 1 tablet (20 mg total) by mouth daily. 90 tablet 3  . traMADol (ULTRAM) 50 MG tablet Take 1-2 tablets (50-100 mg total) by mouth every 6 (six) hours as needed for moderate pain. 15 tablet 0  . vitamin E 400 UNIT capsule Take 400 Units by mouth daily.     No current facility-administered medications for this visit.    Allergies  Allergen Reactions  . No Known Allergies     REVIEW OF SYSTEMS (negative unless checked):   Cardiac:  []  Chest pain or chest pressure? []  Shortness of breath upon activity? []  Shortness of breath when  lying flat? []  Irregular heart rhythm?  Vascular:  []  Pain in calf, thigh, or hip brought on by walking? []  Pain in feet at night that wakes you up from your sleep? []  Blood clot in your veins? []  Leg swelling?  Pulmonary:  []  Oxygen at home? []  Productive cough? []  Wheezing?  Neurologic:  []   Sudden weakness in arms or legs? []  Sudden numbness in arms or legs? []  Sudden onset of difficult speaking or slurred speech? []  Temporary loss of vision in one eye? []  Problems with dizziness?  Gastrointestinal:  []  Blood in stool? []  Vomited blood?  Genitourinary:  []  Burning when urinating? []  Blood in urine?  Psychiatric:  []  Major depression  Hematologic:  []  Bleeding problems? []  Problems with blood clotting?  Dermatologic:  []  Rashes or ulcers?  Constitutional:  []  Fever or chills?  Ear/Nose/Throat:  []  Change in hearing? []  Nose bleeds? []  Sore throat?  Musculoskeletal:  []  Back pain? []  Joint pain? []  Muscle pain?   Physical Examination     Vitals:   03/18/20 1045  BP: (!) 148/71  Pulse: 66  Resp: 20  Temp: 98.4 F (36.9 C)  TempSrc: Temporal  SpO2: 96%  Weight: 164 lb 12.8 oz (74.8 kg)  Height: 5\' 10"  (1.778 m)   Body mass index is 23.65 kg/m.  General:  WDWN in NAD; vital signs documented above Gait: Unaided; no ataxia HENT: WNL, normocephalic Pulmonary: normal non-labored breathing , without Rales, rhonchi,  wheezing Cardiac: regular HR, with sys  Murmurs without carotid bruits Abdomen: soft, NT, no masses Skin: without rashes Vascular Exam/Pulses:  Right Left  Radial 2+ (normal) 2+ (normal)  Ulnar  not evaluated  not evaluated  Femoral 2+ (normal) 2+ (normal)  Popliteal  not palpable  not palpable  DP  not palpable  not palpable  PT  not palpable  not palpable   Extremities: with varicose veins, with reticular veins, without edema, without stasis pigmentation, without lipodermatosclerosis, without ulcers Musculoskeletal: no  muscle wasting or atrophy  Neurologic: A&O X 3;  No focal weakness or paresthesias are detected Psychiatric:  The pt has Normal affect.    RIGHT PROXIMAL MEDIAL LOWER LEG   RIGHT ANKLE      Non-invasive Vascular Imaging   BLE Venous Insufficiency Duplex (03/18/2020):  Right:  - No evidence of deep vein thrombosis seen in the right lower extremity,  from the common femoral through the popliteal veins.  - No evidence of superficial venous thrombosis in the right lower  extremity.  - Deep vein reflux in the CFV, and popliteal vein.  - Superficial vein reflux in thd SFJ and GSV as above.    Left:  - No evidence of deep vein thrombosis seen in the left lower extremity,  from the common femoral through the popliteal veins.  - No evidence of superficial veinous thrombosis in the left lower  extremity.  - Deep vein reflux in the CFV.  - Superficial vein relux in the SFJ, and the GSV as above.  Medical Decision Making   Stephen Nunez is a 86 y.o. male who presents with: BLE chronic venous insufficiency, bilateral varicose veins with complications.  Deep vein reflux seen at the common femoral vein bilaterally.  There is no evidence of DVT or significant reflux with dilated vein..  Both feet are warm and well perfused.   Based on the patient's history and examination, I recommend: elevation, good nutrition, weight maintenance and regular exercise.  I discussed with the patient the use of 15-20 mm knee high compression stockings.  We discussed the 2 varicosity of his of his right lower leg that are close to the skin.  I recommended carrying an Ace wrap when away from home and have 1 available at home in the event of spontaneous bleed.  Apply gauze and Ace wrap for compression  until bleeding stops.  We will make referral to the [facility] RN for consideration of sclerotherapy.  Thank you for allowing [place] to participate in this patient's care.   Barbie Banner, PA-C [facility] Office: [phone]  03/18/2020, 11:01 AM  Clinic MD: Trula Slade

## 2020-03-19 DIAGNOSIS — B0229 Other postherpetic nervous system involvement: Secondary | ICD-10-CM | POA: Diagnosis not present

## 2020-03-29 ENCOUNTER — Ambulatory Visit (INDEPENDENT_AMBULATORY_CARE_PROVIDER_SITE_OTHER): Payer: Self-pay

## 2020-03-29 ENCOUNTER — Other Ambulatory Visit: Payer: Self-pay

## 2020-03-29 DIAGNOSIS — I8393 Asymptomatic varicose veins of bilateral lower extremities: Secondary | ICD-10-CM

## 2020-03-29 NOTE — Progress Notes (Signed)
Treated 2 areas on pt's R lower anterior leg that have bled in the past with Asclera 1% administered with a 27 ga buttlerfly needle. A total of 2 mL was used. Pt tolerated well. Will likely need further sclero in the future and is aware of this. He only wanted to do one syringe today. He was unable to lay on his back flat and has to lay on R side due to pain from shingles in 2000. Compression stockings applied. Provided post procedure care instructions both on handout and verbally. Pt will follow up PRN. See pics attached.

## 2020-04-12 DIAGNOSIS — B0229 Other postherpetic nervous system involvement: Secondary | ICD-10-CM | POA: Diagnosis not present

## 2020-04-12 DIAGNOSIS — L308 Other specified dermatitis: Secondary | ICD-10-CM | POA: Diagnosis not present

## 2020-04-12 DIAGNOSIS — L603 Nail dystrophy: Secondary | ICD-10-CM | POA: Diagnosis not present

## 2020-04-15 ENCOUNTER — Other Ambulatory Visit: Payer: Self-pay

## 2020-04-15 ENCOUNTER — Other Ambulatory Visit: Payer: PPO | Admitting: *Deleted

## 2020-04-15 DIAGNOSIS — E785 Hyperlipidemia, unspecified: Secondary | ICD-10-CM

## 2020-04-15 DIAGNOSIS — I350 Nonrheumatic aortic (valve) stenosis: Secondary | ICD-10-CM | POA: Diagnosis not present

## 2020-04-15 LAB — LIPID PANEL
Chol/HDL Ratio: 2.6 ratio (ref 0.0–5.0)
Cholesterol, Total: 157 mg/dL (ref 100–199)
HDL: 61 mg/dL (ref >39)
LDL Chol Calc (NIH): 81 mg/dL (ref 0–99)
Triglycerides: 79 mg/dL (ref 0–149)
VLDL Cholesterol Cal: 15 mg/dL (ref 5–40)

## 2020-04-16 DIAGNOSIS — H938X3 Other specified disorders of ear, bilateral: Secondary | ICD-10-CM | POA: Diagnosis not present

## 2020-04-16 DIAGNOSIS — H903 Sensorineural hearing loss, bilateral: Secondary | ICD-10-CM | POA: Diagnosis not present

## 2020-04-17 ENCOUNTER — Telehealth: Payer: Self-pay

## 2020-04-17 NOTE — Telephone Encounter (Signed)
-----   Message from Fay Records, MD sent at 04/15/2020  9:46 PM EDT ----- LDL is better at 81    Keep on current regimen

## 2020-04-17 NOTE — Telephone Encounter (Signed)
Patient's wife returning call. 

## 2020-04-17 NOTE — Telephone Encounter (Signed)
The pt called back to go over results. I s/w the pt and his wife and they have both been notified of lab results by phone with verbal understanding. Pt and his wife thanked me for the call and the help. The patient has been notified of the result and verbalized understanding. All questions (if any) were answered. Julaine Hua, [place] 04/17/2020 3:05 PM

## 2020-04-17 NOTE — Telephone Encounter (Signed)
Left message to call back with any questions regarding recent lab results

## 2020-04-24 DIAGNOSIS — G894 Chronic pain syndrome: Secondary | ICD-10-CM | POA: Diagnosis not present

## 2020-04-24 DIAGNOSIS — B0229 Other postherpetic nervous system involvement: Secondary | ICD-10-CM | POA: Diagnosis not present

## 2020-04-24 DIAGNOSIS — I10 Essential (primary) hypertension: Secondary | ICD-10-CM | POA: Diagnosis not present

## 2020-05-17 ENCOUNTER — Other Ambulatory Visit: Payer: Self-pay

## 2020-05-17 MED ORDER — ROSUVASTATIN CALCIUM 20 MG PO TABS: 20.0000 mg | ORAL_TABLET | Freq: Every day | ORAL | 3 refills | Status: DC

## 2020-05-22 DIAGNOSIS — H40013 Open angle with borderline findings, low risk, bilateral: Secondary | ICD-10-CM | POA: Diagnosis not present

## 2020-05-22 DIAGNOSIS — H01001 Unspecified blepharitis right upper eyelid: Secondary | ICD-10-CM | POA: Diagnosis not present

## 2020-05-22 DIAGNOSIS — H5202 Hypermetropia, left eye: Secondary | ICD-10-CM | POA: Diagnosis not present

## 2020-05-22 DIAGNOSIS — Z961 Presence of intraocular lens: Secondary | ICD-10-CM | POA: Diagnosis not present

## 2020-06-27 DIAGNOSIS — B0229 Other postherpetic nervous system involvement: Secondary | ICD-10-CM | POA: Diagnosis not present

## 2020-07-08 ENCOUNTER — Other Ambulatory Visit: Payer: Self-pay

## 2020-07-08 ENCOUNTER — Ambulatory Visit (HOSPITAL_COMMUNITY): Payer: PPO | Attending: Internal Medicine

## 2020-07-08 DIAGNOSIS — E785 Hyperlipidemia, unspecified: Secondary | ICD-10-CM | POA: Diagnosis not present

## 2020-07-08 DIAGNOSIS — I350 Nonrheumatic aortic (valve) stenosis: Secondary | ICD-10-CM

## 2020-07-08 LAB — ECHOCARDIOGRAM COMPLETE
AR max vel: 1.03 cm2
AV Area VTI: 1.18 cm2
AV Area mean vel: 1.04 cm2
AV Mean grad: 14 mmHg
AV Peak grad: 24.1 mmHg
Ao pk vel: 2.46 m/s
Area-P 1/2: 2.74 cm2
P 1/2 time: 533 msec
S' Lateral: 2.9 cm

## 2020-07-10 ENCOUNTER — Telehealth: Payer: Self-pay | Admitting: *Deleted

## 2020-07-10 NOTE — Telephone Encounter (Signed)
   Primary Cardiologist: Dorris Carnes, MD  Chart reviewed as part of pre-operative protocol coverage. Patient was contacted 07/10/2020 in reference to pre-operative risk assessment for pending surgery as outlined below.  Stephen Nunez was last seen on 02/12/2020 by Dr. Harrington Challenger.  Since that day, Stephen Nunez has done well.  Therefore, based on ACC/AHA guidelines, the patient would be at acceptable risk for the planned procedure without further cardiovascular testing.   If needed, he may hold aspirin for 5-7 days prior to the procedure and restart as soon as possible after the procedure.   The patient was advised that if he develops new symptoms prior to surgery to contact our office to arrange for a follow-up visit, and he verbalized understanding.  I will route this recommendation to the requesting party via Epic fax function and remove from pre-op pool. Please call with questions.  [place], [place] 07/10/2020, 5:45 PM

## 2020-07-10 NOTE — Telephone Encounter (Signed)
Low risk procedure. Pending echo result. Echo was done on 1/10, however not read for some reason.

## 2020-07-10 NOTE — Telephone Encounter (Signed)
I am not sure what is wrong with echo report   I have revieweed echo.   LVEF is normal Agree,  Low risk procedure   OK to proceed.

## 2020-07-10 NOTE — Telephone Encounter (Signed)
Our office received ov note from Dr. Dionne Ano office today. In reading this ov note it states plan for Peripheral nerve stimulation therapy. Notes also state once approved will schedule. I called and left message for surgery scheduler for Dr. Jonathon Bellows to please call the office to clarify if cardiac clearance is being requested, if so then need to know type of anesthesia to be used; or is this just an update for the cardiologist as to plan of care for the pt.

## 2020-07-10 NOTE — Telephone Encounter (Signed)
See earlier notation from a few minutes ago. Please disregard as our office has now received a clearance form. I will send clearance request to pre op team.     [facility] Pre-operative Risk Assessment    HEARTCARE STAFF: - Please ensure there is not already an duplicate clearance open for this procedure. - Under Visit Info/Reason for Call, type in Other and utilize the format Clearance MM/DD/YY or Clearance TBD. Do not use dashes or single digits. - If request is for dental extraction, please clarify the # of teeth to be extracted.  Request for surgical clearance:  1. What type of surgery is being performed? PERIPHERAL NERVE STIMULATOR   2. When is this surgery scheduled? 07/31/20   3. What type of clearance is required (medical clearance vs. Pharmacy clearance to hold med vs. Both)? MEDICAL  4. Are there any medications that need to be held prior to surgery and how long? ASA    5. Practice name and name of physician performing surgery? [facility]; DR. Lenord Carbo   6. What is the office phone number? [phone]   7.   What is the office fax number? [place]: JESSICA  8.   Anesthesia type (None, local, MAC, general) ? NO ANESTHESIA TO BE USED PER CLEARANCE REQUEST   Julaine Hua 07/10/2020, 10:32 AM  _________________________________________________________________   (provider comments below)

## 2020-07-31 DIAGNOSIS — G894 Chronic pain syndrome: Secondary | ICD-10-CM | POA: Diagnosis not present

## 2020-07-31 DIAGNOSIS — B0229 Other postherpetic nervous system involvement: Secondary | ICD-10-CM | POA: Diagnosis not present

## 2020-10-04 DIAGNOSIS — B0229 Other postherpetic nervous system involvement: Secondary | ICD-10-CM | POA: Diagnosis not present

## 2020-10-04 DIAGNOSIS — G894 Chronic pain syndrome: Secondary | ICD-10-CM | POA: Diagnosis not present

## 2020-10-07 ENCOUNTER — Other Ambulatory Visit (HOSPITAL_COMMUNITY): Payer: PPO

## 2020-10-09 DIAGNOSIS — M21162 Varus deformity, not elsewhere classified, left knee: Secondary | ICD-10-CM | POA: Diagnosis not present

## 2020-10-09 DIAGNOSIS — M1712 Unilateral primary osteoarthritis, left knee: Secondary | ICD-10-CM | POA: Diagnosis not present

## 2020-10-27 NOTE — Progress Notes (Signed)
Cardiology Office Note   Date:  10/28/2020   ID:  Stephen Nunez, DOB 07-15-1931, MRN [id number]  PCP:  Stephen Cruel, MD  Cardiologist:   Dorris Carnes, MD   F/U of HTN     History of Present Illness: Stephen Nunez is a 87 y.o. male with a history of HTN, HL, mild AS and LBBB   Myovue in 2015 was normal     I saw the pt in Aug 2021  Since seen the pt contineus to do well  No CP  No dizziness  No palpitations   No SOB  Walks and does exercises around house   Sleeping well     Current Meds  Medication Sig  . acetaminophen (TYLENOL) 500 MG tablet Take 1,000 mg by mouth every 6 (six) hours as needed (for pain.).  Marland Kitchen aspirin EC 81 MG tablet Take 81 mg by mouth as directed. Swallow whole. 2x weekly  . Bioflavonoid Products (GRAPE SEED PO) Take 2 capsules by mouth as directed. [place] Grape Capsules - every other day  . Cholecalciferol (VITAMIN D3) 2000 units TABS Take 2,000 Units by mouth as directed. Every other day  . Coenzyme Q10 (CO Q-10) 200 MG CAPS Take 200 mg by mouth as directed. Every other day  . Cyanocobalamin (VITAMIN B-12) 5000 MCG LOZG Take 5,000 mcg by mouth as directed. Every other day  . Glucosamine HCl 1500 MG TABS Take 1,500 mg by mouth as directed. Every other day  . Multiple Vitamin (MULTIVITAMIN WITH MINERALS) TABS tablet Take 1 tablet by mouth as directed. Every other day  . OVER THE COUNTER MEDICATION Apply 1 application topically 4 (four) times daily as needed (for back pain due to shingles.). CBD CREAM (HEMP)  . quinapril (ACCUPRIL) 40 MG tablet Take 40 mg by mouth daily.  . rosuvastatin (CRESTOR) 20 MG tablet Take 1 tablet (20 mg total) by mouth daily.  . traMADol (ULTRAM) 50 MG tablet Take 1-2 tablets (50-100 mg total) by mouth every 6 (six) hours as needed for moderate pain.  . vitamin E 400 UNIT capsule Take 400 Units by mouth daily.     Allergies:   No known allergies   Past Medical History:  Diagnosis Date  . Aortic stenosis   .  Arthritis   . Bell's palsy    mild, left  . Heart murmur   . History of shingles    2000  . Hyperlipidemia   . Hypertension   . OSA (obstructive sleep apnea)   . Pneumonia    as an infant  . Post herpetic neuralgia   . Prostate cancer    prostate cancer  . Tremor     Past Surgical History:  Procedure Laterality Date  . COLONOSCOPY    . HERNIA REPAIR    . INGUINAL HERNIA REPAIR Right 09/07/2017   Procedure: OPEN REPAIR RIGHT INGUINAL HERNIA;  Surgeon: Armandina Gemma, MD;  [facility];  Service: General;  Laterality: Right;  . INSERTION OF MESH Right 09/07/2017   Procedure: INSERTION OF MESH;  Surgeon: Armandina Gemma, MD;  Location: [facility];  Service: General;  Laterality: Right;  . PROSTATE SURGERY    . SPINAL CORD STIMULATOR INSERTION N/A 04/26/2015   Procedure: LUMBAR SPINAL CORD STIMULATOR INSERTION;  Surgeon: Clydell Hakim, MD;  Location: [facility];  Service: Neurosurgery;  Laterality: N/A;  . SPINAL CORD STIMULATOR REMOVAL N/A 05/15/2016   Procedure: THORACIC SPINAL CORD STIMULATOR REMOVAL;  Surgeon: Clydell Hakim, MD;  Location:  [facility];  Service: Neurosurgery;  Laterality: N/A;  THORACIC SPINAL CORD STIMULATOR REMOVAL  . TONSILLECTOMY       Social History:  The patient  reports that he has never smoked. He has never used smokeless tobacco. He reports that he does not drink alcohol and does not use drugs.   Family History:  The patient's family history includes Cancer in his mother; Heart attack in his brother and father; Multiple sclerosis in his brother; Other in his mother; Stroke in his brother.    ROS:  Please see the history of present illness. All other systems are reviewed and  Negative to the above problem except as noted.    PHYSICAL EXAM: VS:  BP 140/72   Pulse 71   Ht 5\' 10"  (1.778 m)   Wt 160 lb 12.8 oz (72.9 kg)   SpO2 99%   BMI 23.07 kg/m   GEN: Thin 87 yo, in no acute distress  HEENT: normal  Neck: no JVD, carotid bruits, or masses Cardiac: RRR;  Gr II/VI systold early peaking systolic murmur  No Lower extremity  edema  Respiratory:  clear to auscultation bilaterally,  GI: soft, nontender, nondistended, + BS  No hepatomegaly  MS: no deformity Moving all extremities   Skin: warm and dry, no rash Neuro:  Rest tremor arms/hands   Otherwise derred   Psych: euthymic mood, full affect   EKG:  EKG   SR 71  LBBB    ECHO Jan 2022  1. Left ventricular ejection fraction, by estimation, is 50 to 55%. The left ventricle has low normal function. The left ventricle has no regional wall motion abnormalities. There is moderate concentric left ventricular hypertrophy. Left ventricular diastolic parameters are consistent with Grade I diastolic dysfunction (impaired relaxation). 2. Right ventricular systolic function is normal. The right ventricular size is normal. 3. The mitral valve is grossly normal. No evidence of mitral valve regurgitation. 4. Tricuspid valve regurgitation is mild to moderate. 5. The aortic valve is tricuspid. There is moderate calcification of the aortic valve. There is moderate thickening of the aortic valve. Aortic valve regurgitation is mild to moderate. Mild to moderate aortic valve stenosis. Comparison(s): A prior study was performed on 02/08/2019. No significant change from prior study. Lipid Panel    Component Value Date/Time   CHOL 157 04/15/2020 0929   TRIG 79 04/15/2020 0929   HDL 61 04/15/2020 0929   CHOLHDL 2.6 04/15/2020 0929   LDLCALC 81 04/15/2020 0929      Wt Readings from Last 3 Encounters:  10/28/20 160 lb 12.8 oz (72.9 kg)  03/18/20 164 lb 12.8 oz (74.8 kg)  02/12/20 163 lb 3.2 oz (74 kg)      ASSESSMENT AND PLAN:  1  HTN   BP is adequately controlled for age   Keep on current meds    2  Hx AV dz   Pt with AS / AI by echo   Last one done in Jan is rel stable   Cont to follow    3  LBBB  Old  No change    Normal myovue 2015     5  HL  Will get lipids today   Current medicines are reviewed  at length with the patient today.  The patient does not have concerns regarding medicines.  Signed, Dorris Carnes, MD  10/28/2020 9:42 AM    [name] [address], [place], [place]  [address] Phone: [phone]; Fax: [phone]

## 2020-10-28 ENCOUNTER — Other Ambulatory Visit: Payer: Self-pay

## 2020-10-28 ENCOUNTER — Encounter: Payer: Self-pay | Admitting: Internal Medicine

## 2020-10-28 ENCOUNTER — Ambulatory Visit: Payer: PPO | Admitting: Internal Medicine

## 2020-10-28 VITALS — BP 140/72 | HR 71 | Ht 70.0 in | Wt 160.8 lb

## 2020-10-28 DIAGNOSIS — I350 Nonrheumatic aortic (valve) stenosis: Secondary | ICD-10-CM

## 2020-10-28 DIAGNOSIS — E785 Hyperlipidemia, unspecified: Secondary | ICD-10-CM | POA: Diagnosis not present

## 2020-10-28 DIAGNOSIS — Z79899 Other long term (current) drug therapy: Secondary | ICD-10-CM

## 2020-10-28 DIAGNOSIS — I83899 Varicose veins of unspecified lower extremities with other complications: Secondary | ICD-10-CM | POA: Diagnosis not present

## 2020-10-28 LAB — COMPREHENSIVE METABOLIC PANEL WITH GFR
ALT: 13 IU/L (ref 0–44)
AST: 23 IU/L (ref 0–40)
Albumin/Globulin Ratio: 1.8 (ref 1.2–2.2)
Albumin: 4.3 g/dL (ref 3.6–4.6)
Alkaline Phosphatase: 63 IU/L (ref 44–121)
BUN/Creatinine Ratio: 24 (ref 10–24)
BUN: 35 mg/dL — ABNORMAL HIGH (ref 8–27)
Bilirubin Total: 0.7 mg/dL (ref 0.0–1.2)
CO2: 25 mmol/L (ref 20–29)
Calcium: 9.6 mg/dL (ref 8.6–10.2)
Chloride: 103 mmol/L (ref 96–106)
Creatinine, Ser: 1.44 mg/dL — ABNORMAL HIGH (ref 0.76–1.27)
Globulin, Total: 2.4 g/dL (ref 1.5–4.5)
Glucose: 104 mg/dL — ABNORMAL HIGH (ref 65–99)
Potassium: 4.5 mmol/L (ref 3.5–5.2)
Sodium: 141 mmol/L (ref 134–144)
Total Protein: 6.7 g/dL (ref 6.0–8.5)
eGFR: 47 mL/min/1.73 — ABNORMAL LOW

## 2020-10-28 LAB — CBC
Hematocrit: 37.6 % (ref 37.5–51.0)
Hemoglobin: 12.5 g/dL — ABNORMAL LOW (ref 13.0–17.7)
MCH: 31.3 pg (ref 26.6–33.0)
MCHC: 33.2 g/dL (ref 31.5–35.7)
MCV: 94 fL (ref 79–97)
Platelets: 193 x10E3/uL (ref 150–450)
RBC: 3.99 x10E6/uL — ABNORMAL LOW (ref 4.14–5.80)
RDW: 12.7 % (ref 11.6–15.4)
WBC: 6.3 x10E3/uL (ref 3.4–10.8)

## 2020-10-28 LAB — LIPID PANEL
Chol/HDL Ratio: 2.5 ratio (ref 0.0–5.0)
Cholesterol, Total: 149 mg/dL (ref 100–199)
HDL: 60 mg/dL (ref >39)
LDL Chol Calc (NIH): 73 mg/dL (ref 0–99)
Triglycerides: 82 mg/dL (ref 0–149)
VLDL Cholesterol Cal: 16 mg/dL (ref 5–40)

## 2020-10-28 NOTE — Patient Instructions (Addendum)
Medication Instructions: Your physician recommends that you continue on your current medications as directed. Please refer to the Current Medication list given to you today.  *If you need a refill on your cardiac medications before your next appointment, please call your pharmacy*   Lab Work: CMET, LIPID, CBC If you have labs (blood work) drawn today and your tests are completely normal, you will receive your results only by: Marland Kitchen MyChart Message (if you have MyChart) OR . A paper copy in the mail If you have any lab test that is abnormal or we need to change your treatment, we will call you to review the results.   Testing/Procedures: none   Follow-Up: At [facility], you and your health needs are our priority.  As part of our continuing mission to provide you with exceptional heart care, we have created designated Provider Care Teams.  These Care Teams include your primary Cardiologist (physician) and Advanced Practice Providers (APPs -  Physician Assistants and Nurse Practitioners) who all work together to provide you with the care you need, when you need it.  We recommend signing up for the patient portal called "MyChart".  Sign up information is provided on this After Visit Summary.  MyChart is used to connect with patients for Virtual Visits (Telemedicine).  Patients are able to view lab/test results, encounter notes, upcoming appointments, etc.  Non-urgent messages can be sent to your provider as well.   To learn more about what you can do with MyChart, go to [email or web address].    Your next appointment: month(s)... eight months   The format for your next appointment:   In Person  Provider:   Dorris Carnes, MD   Other Instructions

## 2021-01-03 DIAGNOSIS — M21162 Varus deformity, not elsewhere classified, left knee: Secondary | ICD-10-CM | POA: Diagnosis not present

## 2021-01-03 DIAGNOSIS — M1712 Unilateral primary osteoarthritis, left knee: Secondary | ICD-10-CM | POA: Diagnosis not present

## 2021-02-05 DIAGNOSIS — I10 Essential (primary) hypertension: Secondary | ICD-10-CM | POA: Diagnosis not present

## 2021-02-05 DIAGNOSIS — Z0000 Encounter for general adult medical examination without abnormal findings: Secondary | ICD-10-CM | POA: Diagnosis not present

## 2021-02-05 DIAGNOSIS — I868 Varicose veins of other specified sites: Secondary | ICD-10-CM | POA: Diagnosis not present

## 2021-02-05 DIAGNOSIS — R0989 Other specified symptoms and signs involving the circulatory and respiratory systems: Secondary | ICD-10-CM | POA: Diagnosis not present

## 2021-02-05 DIAGNOSIS — E7800 Pure hypercholesterolemia, unspecified: Secondary | ICD-10-CM | POA: Diagnosis not present

## 2021-02-05 DIAGNOSIS — Z8546 Personal history of malignant neoplasm of prostate: Secondary | ICD-10-CM | POA: Diagnosis not present

## 2021-02-07 DIAGNOSIS — Z8546 Personal history of malignant neoplasm of prostate: Secondary | ICD-10-CM | POA: Diagnosis not present

## 2021-02-07 DIAGNOSIS — Z0000 Encounter for general adult medical examination without abnormal findings: Secondary | ICD-10-CM | POA: Diagnosis not present

## 2021-02-07 DIAGNOSIS — N183 Chronic kidney disease, stage 3 unspecified: Secondary | ICD-10-CM | POA: Diagnosis not present

## 2021-02-07 DIAGNOSIS — E7800 Pure hypercholesterolemia, unspecified: Secondary | ICD-10-CM | POA: Diagnosis not present

## 2021-02-07 DIAGNOSIS — I10 Essential (primary) hypertension: Secondary | ICD-10-CM | POA: Diagnosis not present

## 2021-02-07 DIAGNOSIS — B0229 Other postherpetic nervous system involvement: Secondary | ICD-10-CM | POA: Diagnosis not present

## 2021-03-13 DIAGNOSIS — L853 Xerosis cutis: Secondary | ICD-10-CM | POA: Diagnosis not present

## 2021-03-13 DIAGNOSIS — L218 Other seborrheic dermatitis: Secondary | ICD-10-CM | POA: Diagnosis not present

## 2021-03-13 DIAGNOSIS — L570 Actinic keratosis: Secondary | ICD-10-CM | POA: Diagnosis not present

## 2021-03-13 DIAGNOSIS — L820 Inflamed seborrheic keratosis: Secondary | ICD-10-CM | POA: Diagnosis not present

## 2021-03-13 DIAGNOSIS — D692 Other nonthrombocytopenic purpura: Secondary | ICD-10-CM | POA: Diagnosis not present

## 2021-03-13 DIAGNOSIS — L821 Other seborrheic keratosis: Secondary | ICD-10-CM | POA: Diagnosis not present

## 2021-04-16 DIAGNOSIS — M1712 Unilateral primary osteoarthritis, left knee: Secondary | ICD-10-CM | POA: Diagnosis not present

## 2021-04-16 DIAGNOSIS — M25562 Pain in left knee: Secondary | ICD-10-CM | POA: Diagnosis not present

## 2021-04-25 ENCOUNTER — Other Ambulatory Visit: Payer: Self-pay | Admitting: Internal Medicine

## 2021-05-21 ENCOUNTER — Telehealth: Payer: Self-pay

## 2021-05-21 NOTE — Telephone Encounter (Signed)
   [facility] Pre-operative Risk Assessment    Patient Name: Stephen Nunez  DOB: 1932/06/17 MRN: [id number]  HEARTCARE STAFF:  - IMPORTANT!!!!!! Under Visit Info/Reason for Call, type in Other and utilize the format Clearance MM/DD/YY or Clearance TBD. Do not use dashes or single digits. - Please review there is not already an duplicate clearance open for this procedure. - If request is for dental extraction, please clarify the # of teeth to be extracted. - If the patient is currently at the dentist's office, call Pre-Op Callback Staff (MA/nurse) to input urgent request.  - If the patient is not currently in the dentist office, please route to the Pre-Op pool.  Request for surgical clearance:  What type of surgery is being performed? Left Total Knee Arthroplasty  When is this surgery scheduled? 07/01/2021  What type of clearance is required (medical clearance vs. Pharmacy clearance to hold med vs. Both)? Medical  Are there any medications that need to be held prior to surgery and how long? N/a  Practice name and name of physician performing surgery? [facility] Dr Paralee Cancel  What is the office phone number? Derl Barrow [phone]   7.   What is the office fax number? [phone]  8.   Anesthesia type (None, local, MAC, general) ? Spinal   Ulice Brilliant T 05/21/2021, 8:40 AM  _________________________________________________________________   (provider comments below)

## 2021-05-21 NOTE — Telephone Encounter (Signed)
Patient has a follow-up visit with Dr. Harrington Challenger scheduled for 06/06/2021 and knee surgery is not scheduled until 07/01/2021. Therefore, pre-op risk assessment can be addressed at visit with Dr. Harrington Challenger. I will route pre-op form to Dr. Harrington Challenger so she is aware and will add "preop eval" to appointment notes.   Will remove from preop pool.  Darreld Mclean, PA-C 05/21/2021 9:27 AM

## 2021-06-02 DIAGNOSIS — Z961 Presence of intraocular lens: Secondary | ICD-10-CM | POA: Diagnosis not present

## 2021-06-02 DIAGNOSIS — H4720 Unspecified optic atrophy: Secondary | ICD-10-CM | POA: Diagnosis not present

## 2021-06-02 DIAGNOSIS — H52203 Unspecified astigmatism, bilateral: Secondary | ICD-10-CM | POA: Diagnosis not present

## 2021-06-02 DIAGNOSIS — H40013 Open angle with borderline findings, low risk, bilateral: Secondary | ICD-10-CM | POA: Diagnosis not present

## 2021-06-06 ENCOUNTER — Ambulatory Visit: Payer: PPO | Admitting: Internal Medicine

## 2021-06-06 ENCOUNTER — Encounter: Payer: Self-pay | Admitting: Internal Medicine

## 2021-06-06 ENCOUNTER — Other Ambulatory Visit: Payer: Self-pay

## 2021-06-06 VITALS — BP 130/70 | HR 59 | Ht 70.0 in | Wt 165.6 lb

## 2021-06-06 DIAGNOSIS — I350 Nonrheumatic aortic (valve) stenosis: Secondary | ICD-10-CM | POA: Diagnosis not present

## 2021-06-06 DIAGNOSIS — Z79899 Other long term (current) drug therapy: Secondary | ICD-10-CM | POA: Diagnosis not present

## 2021-06-06 DIAGNOSIS — I83899 Varicose veins of unspecified lower extremities with other complications: Secondary | ICD-10-CM

## 2021-06-06 MED ORDER — ROSUVASTATIN CALCIUM 20 MG PO TABS: 20.0000 mg | ORAL_TABLET | Freq: Every day | ORAL | 1 refills | Status: DC

## 2021-06-06 NOTE — Progress Notes (Signed)
Cardiology Office Note   Date:  06/06/2021   ID:  Stephen Nunez, DOB 10/28/1931, MRN [id number]  PCP:  Lawerance Cruel, MD  Cardiologist:   Dorris Carnes, MD   F/U of HTN     History of Present Illness: Stephen Nunez is a 87 y.o. male with a history of HTN, HL, mild AS and LBBB   Myovue in 2015 was normal     I saw the pt in May 2022  Since seen he hs done OK from cardiac standpoint   He gets on stairmaster and stationary bike without problem    Knee limits walking but he does do work around the house  Denies CP   Breathing is OK  No presyncope/syncope     Current Meds  Medication Sig   acetaminophen (TYLENOL) 500 MG tablet Take 1,000 mg by mouth every 6 (six) hours as needed (for pain.).   aspirin EC 81 MG tablet Take 81 mg by mouth as directed. Swallow whole. 2x weekly   Bioflavonoid Products (GRAPE SEED PO) Take 2 capsules by mouth as directed. [place] Grape Capsules - every other day   Cholecalciferol (VITAMIN D3) 2000 units TABS Take 2,000 Units by mouth as directed. Every other day   Coenzyme Q10 (CO Q-10) 200 MG CAPS Take 200 mg by mouth as directed. Every other day   Cyanocobalamin (VITAMIN B-12) 5000 MCG LOZG Take 5,000 mcg by mouth as directed. Every other day   Glucosamine HCl 1500 MG TABS Take 1,500 mg by mouth as directed. Every other day   Multiple Vitamin (MULTIVITAMIN WITH MINERALS) TABS tablet Take 1 tablet by mouth as directed. Every other day   OVER THE COUNTER MEDICATION Apply 1 application topically 4 (four) times daily as needed (for back pain due to shingles.). CBD CREAM (HEMP)   quinapril (ACCUPRIL) 40 MG tablet Take 40 mg by mouth daily.   rosuvastatin (CRESTOR) 20 MG tablet TAKE 1 TABLET BY MOUTH EVERY DAY * STOP PRAVASTATIN*   traMADol (ULTRAM) 50 MG tablet Take 1-2 tablets (50-100 mg total) by mouth every 6 (six) hours as needed for moderate pain.   vitamin E 400 UNIT capsule Take 400 Units by mouth daily.     Allergies:   No known  allergies   Past Medical History:  Diagnosis Date   Aortic stenosis    Arthritis    Bell's palsy    mild, left   Heart murmur    History of shingles    2000   Hyperlipidemia    Hypertension    OSA (obstructive sleep apnea)    Pneumonia    as an infant   Post herpetic neuralgia    Prostate cancer    prostate cancer   Tremor     Past Surgical History:  Procedure Laterality Date   COLONOSCOPY     HERNIA REPAIR     INGUINAL HERNIA REPAIR Right 09/07/2017   Procedure: OPEN REPAIR RIGHT INGUINAL HERNIA;  Surgeon: Armandina Gemma, MD;  Location: [facility];  Service: General;  Laterality: Right;   INSERTION OF MESH Right 09/07/2017   Procedure: INSERTION OF MESH;  Surgeon: Armandina Gemma, MD;  Location: [facility];  Service: General;  Laterality: Right;   PROSTATE SURGERY     SPINAL CORD STIMULATOR INSERTION N/A 04/26/2015   Procedure: LUMBAR SPINAL CORD STIMULATOR INSERTION;  Surgeon: Clydell Hakim, MD;  Location: [facility];  Service: Neurosurgery;  Laterality: N/A;   SPINAL CORD STIMULATOR REMOVAL N/A 05/15/2016  Procedure: THORACIC SPINAL CORD STIMULATOR REMOVAL;  Surgeon: Clydell Hakim, MD;  Location: [facility];  Service: Neurosurgery;  Laterality: N/A;  THORACIC SPINAL CORD STIMULATOR REMOVAL   TONSILLECTOMY       Social History:  The patient  reports that he has never smoked. He has never used smokeless tobacco. He reports that he does not drink alcohol and does not use drugs.   Family History:  The patient's family history includes Cancer in his mother; Heart attack in his brother and father; Multiple sclerosis in his brother; Other in his mother; Stroke in his brother.    ROS:  Please see the history of present illness. All other systems are reviewed and  Negative to the above problem except as noted.    PHYSICAL EXAM: VS:  BP 130/70   Pulse (!) 59   Ht 5\' 10"  (1.778 m)   Wt 165 lb 9.6 oz (75.1 kg)   SpO2 97%   BMI 23.76 kg/m   GEN: Thin 87 yo, in no acute distress  HEENT:  normal  Neck: no JVD, carotid bruits,  Cardiac: RRR; Gr III/VI sytolic murmur base   Mid peaking   No Lower extremity  edema  Respiratory:  clear to auscultation bilaterally,  GI: soft, nontender, nondistended, + BS  No hepatomegaly  MS: Patient is kyphotic  Moving all extremities   Skin: warm and dry, no rash Neuro:  Rest tremor arms/hands   Otherwise derred   Psych: euthymic mood, full affect   EKG:  EKG   SB 59 bpm   LBBB ECHO Jan 2022  1. Left ventricular ejection fraction, by estimation, is 50 to 55%. The left ventricle has low normal function. The left ventricle has no regional wall motion abnormalities. There is moderate concentric left ventricular hypertrophy. Left ventricular diastolic parameters are consistent with Grade I diastolic dysfunction (impaired relaxation). 2. Right ventricular systolic function is normal. The right ventricular size is normal. 3. The mitral valve is grossly normal. No evidence of mitral valve regurgitation. 4. Tricuspid valve regurgitation is mild to moderate. 5. The aortic valve is tricuspid. There is moderate calcification of the aortic valve. There is moderate thickening of the aortic valve. Aortic valve regurgitation is mild to moderate. Mild to moderate aortic valve stenosis. Comparison(s): A prior study was performed on 02/08/2019. No significant change from prior study. Lipid Panel    Component Value Date/Time   CHOL 149 10/28/2020 1009   TRIG 82 10/28/2020 1009   HDL 60 10/28/2020 1009   CHOLHDL 2.5 10/28/2020 1009   LDLCALC 73 10/28/2020 1009      Wt Readings from Last 3 Encounters:  06/06/21 165 lb 9.6 oz (75.1 kg)  10/28/20 160 lb 12.8 oz (72.9 kg)  03/18/20 164 lb 12.8 oz (74.8 kg)      ASSESSMENT AND PLAN:  1  Aortic stenosis   Pt with mild to moderate AS / AI on eco  in Jan 2022   He is doing well  No sympotms    WOuld get echo to reevaluate    2  Preop risk stratification   Pt denies symptoms of angina   No evid of CHF    In SR    Overall functional capacity is greater than 4 METS Pt has AS /AI   Would get echo to reevaluate gradients   prior to clearance  3  HTN   Adequate control  4  LBBB  Old  No change    Normal myovue 2015  5  HL  Lipids excellent  LDL 62  HDL 56      Current medicines are reviewed at length with the patient today.  The patient does not have concerns regarding medicines.  Signed, Dorris Carnes, MD  06/06/2021 9:41 AM    [address] [place] [place], [place], [place]  [address] Phone: [phone]; Fax: [phone]

## 2021-06-06 NOTE — Patient Instructions (Signed)
Medication Instructions:  Your physician recommends that you continue on your current medications as directed. Please refer to the Current Medication list given to you today.  *If you need a refill on your cardiac medications before your next appointment, please call your pharmacy*   Lab Work: none If you have labs (blood work) drawn today and your tests are completely normal, you will receive your results only by: [place] (if you have MyChart) OR A paper copy in the mail If you have any lab test that is abnormal or we need to change your treatment, we will call you to review the results.   Testing/Procedures: Your physician has requested that you have an echocardiogram. Echocardiography is a painless test that uses sound waves to create images of your heart. It provides your doctor with information about the size and shape of your heart and how well your heart's chambers and valves are working. This procedure takes approximately one hour. There are no restrictions for this procedure.    Follow-Up: At [facility], you and your health needs are our priority.  As part of our continuing mission to provide you with exceptional heart care, we have created designated Provider Care Teams.  These Care Teams include your primary Cardiologist (physician) and Advanced Practice Providers (APPs -  Physician Assistants and Nurse Practitioners) who all work together to provide you with the care you need, when you need it.  We recommend signing up for the patient portal called "MyChart".  Sign up information is provided on this After Visit Summary.  MyChart is used to connect with patients for Virtual Visits (Telemedicine).  Patients are able to view lab/test results, encounter notes, upcoming appointments, etc.  Non-urgent messages can be sent to your provider as well.   To learn more about what you can do with MyChart, go to [email or web address].    Your next appointment:   9  month(s)  The format for your next appointment:   In Person  Provider:   Dorris Carnes, MD     Other Instructions

## 2021-06-18 DIAGNOSIS — R152 Fecal urgency: Secondary | ICD-10-CM | POA: Diagnosis not present

## 2021-06-18 DIAGNOSIS — R159 Full incontinence of feces: Secondary | ICD-10-CM | POA: Diagnosis not present

## 2021-06-18 NOTE — Progress Notes (Addendum)
COVID swab appointment: 06/27/21 @  COVID Vaccine Completed: yes x5 Date COVID Vaccine completed: 08/05/19, 08/29/19 Has received booster: COVID vaccine manufacturer: [place]      Date of COVID positive in last 90 days: no  PCP - Lona Kettle, MD Cardiologist - Dorris Carnes, MD  Medical Clearance 05/21/21 by Lona Kettle on chart Cardiac clearance after ECHO scheduled 06/20/21  Chest x-ray - n/a EKG - 06/06/21 Epic Stress Test - years ago per pt ECHO - 06/20/21 Cardiac Cath - n/a Pacemaker/ICD device last checked: n/a Spinal Cord Stimulator: n/a  Sleep Study - yes, negative per pt CPAP -   Fasting Blood Sugar - n/a Checks Blood Sugar _____ times a day  Blood Thinner Instructions: Aspirin Instructions: ASA 81, Hold 7 days Last Dose:  Activity level: Can go up a flight of stairs and perform activities of daily living without stopping and without symptoms of chest pain or shortness of breath.       Anesthesia review: HTN, OSA, aortic stenosis, heart murmur   Patient denies shortness of breath, fever, cough and chest pain at PAT appointment   Patient verbalized understanding of instructions that were given to them at the PAT appointment. Patient was also instructed that they will need to review over the PAT instructions again at home before surgery.

## 2021-06-18 NOTE — Patient Instructions (Addendum)
DUE TO COVID-19 ONLY ONE VISITOR IS ALLOWED TO COME WITH YOU AND STAY IN THE WAITING ROOM ONLY DURING PRE OP AND PROCEDURE.   **NO VISITORS ARE ALLOWED IN THE SHORT STAY AREA OR RECOVERY ROOM!!**  IF YOU WILL BE ADMITTED INTO THE HOSPITAL YOU ARE ALLOWED ONLY TWO SUPPORT PEOPLE DURING VISITATION HOURS ONLY (10AM -8PM)   The support person(s) may change daily. The support person(s) must pass our screening, gel in and out, and wear a mask at all times, including in the patients room. Patients must also wear a mask when staff or their support person are in the room.  No visitors under the age of 40. Any visitor under the age of 38 must be accompanied by an adult.    COVID SWAB TESTING MUST BE COMPLETED ON:  06/27/21 @ 12:15 pm    Report to [facility] Entrance. Have a seat to lobby on right. Call [phone] and give them you name and let them know you are here for COVID test. You are not required to quarantine, however you are required to wear a well-fitted mask when you are out and around people not in your household.  Hand Hygiene often Do NOT share personal items Notify your provider if you are in close contact with someone who has COVID or you develop fever 100.4 or greater, new onset of sneezing, cough, sore throat, shortness of breath or body aches.       Your procedure is scheduled on: 07/01/21   Report to [facility] Main Entrance    Report to admitting at 7:15 AM   Call this number if you have problems the morning of surgery [phone]   Do not eat food :After Midnight.   May have liquids until 7:00 AM day of surgery  CLEAR LIQUID DIET  Foods Allowed                                                                     Foods Excluded  Water, Black Coffee and tea (no milk or creamer)           liquids that you cannot  Plain Jell-O in any flavor  (No red)                                    see through such as: Fruit ices (not with fruit pulp)                                             milk, soups, orange juice              Iced Popsicles (No red)                                               All solid food  Apple juices Sports drinks like Gatorade (No red) Lightly seasoned clear broth or consume(fat free) Sugar     The day of surgery:  Drink ONE (1) Pre-Surgery Clear Ensure by 7:00 am the morning of surgery. Drink in one sitting. Do not sip.  This drink was given to you during your hospital  pre-op appointment visit. Nothing else to drink after completing the  Pre-Surgery Clear Ensure.          If you have questions, please contact your surgeons office.     Oral Hygiene is also important to reduce your risk of infection.                                    Remember - BRUSH YOUR TEETH THE MORNING OF SURGERY WITH YOUR REGULAR TOOTHPASTE   Stop taking vitamins, minerals and supplements 7 days before surgery.   Take these medicines the morning of surgery with A SIP OF WATER: Tylenol, Crestor                              You may not have any metal on your body including jewelry, and body piercing             Do not wear lotions, powders, cologne, or deodorant              Men may shave face and neck.   Do not bring valuables to the hospital. [place].   Bring small overnight bag day of surgery.   Special Instructions: Bring a copy of your healthcare power of attorney and living will documents         the day of surgery if you haven't scanned them before.              Please read over the following fact sheets you were given: IF YOU HAVE QUESTIONS ABOUT YOUR PRE-OP INSTRUCTIONS PLEASE CALL [facility] - Preparing for Surgery Before surgery, you can play an important role.  Because skin is not sterile, your skin needs to be as free of germs as possible.  You can reduce the number of germs on your skin by washing with CHG  (chlorahexidine gluconate) soap before surgery.  CHG is an antiseptic cleaner which kills germs and bonds with the skin to continue killing germs even after washing. Please DO NOT use if you have an allergy to CHG or antibacterial soaps.  If your skin becomes reddened/irritated stop using the CHG and inform your nurse when you arrive at Short Stay. Do not shave (including legs and underarms) for at least 48 hours prior to the first CHG shower.  You may shave your face/neck.  Please follow these instructions carefully:  1.  Shower with CHG Soap the night before surgery and the  morning of surgery.  2.  If you choose to wash your hair, wash your hair first as usual with your normal  shampoo.  3.  After you shampoo, rinse your hair and body thoroughly to remove the shampoo.                             4.  Use CHG as you would any other  liquid soap.  You can apply chg directly to the skin and wash.  Gently with a scrungie or clean washcloth.  5.  Apply the CHG Soap to your body ONLY FROM THE NECK DOWN.   Do   not use on face/ open                           Wound or open sores. Avoid contact with eyes, ears mouth and   genitals (private parts).                       Wash face,  Genitals (private parts) with your normal soap.             6.  Wash thoroughly, paying special attention to the area where your    surgery  will be performed.  7.  Thoroughly rinse your body with warm water from the neck down.  8.  DO NOT shower/wash with your normal soap after using and rinsing off the CHG Soap.                9.  Pat yourself dry with a clean towel.            10.  Wear clean pajamas.            11.  Place clean sheets on your bed the night of your first shower and do not  sleep with pets. Day of Surgery : Do not apply any lotions/deodorants the morning of surgery.  Please wear clean clothes to the hospital/surgery center.  FAILURE TO FOLLOW THESE INSTRUCTIONS MAY RESULT IN THE CANCELLATION OF YOUR  SURGERY  PATIENT SIGNATURE_________________________________  NURSE SIGNATURE__________________________________  ________________________________________________________________________   Adam Phenix  An incentive spirometer is a tool that can help keep your lungs clear and active. This tool measures how well you are filling your lungs with each breath. Taking long deep breaths may help reverse or decrease the chance of developing breathing (pulmonary) problems (especially infection) following: A long period of time when you are unable to move or be active. BEFORE THE PROCEDURE  If the spirometer includes an indicator to show your best effort, your nurse or respiratory therapist will set it to a desired goal. If possible, sit up straight or lean slightly forward. Try not to slouch. Hold the incentive spirometer in an upright position. INSTRUCTIONS FOR USE  Sit on the edge of your bed if possible, or sit up as far as you can in bed or on a chair. Hold the incentive spirometer in an upright position. Breathe out normally. Place the mouthpiece in your mouth and seal your lips tightly around it. Breathe in slowly and as deeply as possible, raising the piston or the ball toward the top of the column. Hold your breath for 3-5 seconds or for as long as possible. Allow the piston or ball to fall to the bottom of the column. Remove the mouthpiece from your mouth and breathe out normally. Rest for a few seconds and repeat Steps 1 through 7 at least 10 times every 1-2 hours when you are awake. Take your time and take a few normal breaths between deep breaths. The spirometer may include an indicator to show your best effort. Use the indicator as a goal to work toward during each repetition. After each set of 10 deep breaths, practice coughing to be sure your lungs are clear. If you have an incision (the  cut made at the time of surgery), support your incision when coughing by placing a pillow or  rolled up towels firmly against it. Once you are able to get out of bed, walk around indoors and cough well. You may stop using the incentive spirometer when instructed by your caregiver.  RISKS AND COMPLICATIONS Take your time so you do not get dizzy or light-headed. If you are in pain, you may need to take or ask for pain medication before doing incentive spirometry. It is harder to take a deep breath if you are having pain. AFTER USE Rest and breathe slowly and easily. It can be helpful to keep track of a log of your progress. Your caregiver can provide you with a simple table to help with this. If you are using the spirometer at home, follow these instructions: [place] IF:  You are having difficultly using the spirometer. You have trouble using the spirometer as often as instructed. Your pain medication is not giving enough relief while using the spirometer. You develop fever of 100.5 F (38.1 C) or higher. SEEK IMMEDIATE MEDICAL CARE IF:  You cough up bloody sputum that had not been present before. You develop fever of 102 F (38.9 C) or greater. You develop worsening pain at or near the incision site. MAKE SURE YOU:  Understand these instructions. Will watch your condition. Will get help right away if you are not doing well or get worse. Document Released: 10/26/2006 Document Revised: 09/07/2011 Document Reviewed: 12/27/2006 ExitCare Patient Information 2014 ExitCare, [place].   ________________________________________________________________________  WHAT IS A BLOOD TRANSFUSION? Blood Transfusion Information  A transfusion is the replacement of blood or some of its parts. Blood is made up of multiple cells which provide different functions. Red blood cells carry oxygen and are used for blood loss replacement. White blood cells fight against infection. Platelets control bleeding. Plasma helps clot blood. Other blood products are available for specialized needs, such  as hemophilia or other clotting disorders. BEFORE THE TRANSFUSION  Who gives blood for transfusions?  Healthy volunteers who are fully evaluated to make sure their blood is safe. This is blood bank blood. Transfusion therapy is the safest it has ever been in the practice of medicine. Before blood is taken from a donor, a complete history is taken to make sure that person has no history of diseases nor engages in risky social behavior (examples are intravenous drug use or sexual activity with multiple partners). The donor's travel history is screened to minimize risk of transmitting infections, such as malaria. The donated blood is tested for signs of infectious diseases, such as HIV and hepatitis. The blood is then tested to be sure it is compatible with you in order to minimize the chance of a transfusion reaction. If you or a relative donates blood, this is often done in anticipation of surgery and is not appropriate for emergency situations. It takes many days to process the donated blood. RISKS AND COMPLICATIONS Although transfusion therapy is very safe and saves many lives, the main dangers of transfusion include:  Getting an infectious disease. Developing a transfusion reaction. This is an allergic reaction to something in the blood you were given. Every precaution is taken to prevent this. The decision to have a blood transfusion has been considered carefully by your caregiver before blood is given. Blood is not given unless the benefits outweigh the risks. AFTER THE TRANSFUSION Right after receiving a blood transfusion, you will usually feel much better and more energetic.  This is especially true if your red blood cells have gotten low (anemic). The transfusion raises the level of the red blood cells which carry oxygen, and this usually causes an energy increase. The nurse administering the transfusion will monitor you carefully for complications. HOME CARE INSTRUCTIONS  No special instructions  are needed after a transfusion. You may find your energy is better. Speak with your caregiver about any limitations on activity for underlying diseases you may have. SEEK MEDICAL CARE IF:  Your condition is not improving after your transfusion. You develop redness or irritation at the intravenous (IV) site. SEEK IMMEDIATE MEDICAL CARE IF:  Any of the following symptoms occur over the next 12 hours: Shaking chills. You have a temperature by mouth above 102 F (38.9 C), not controlled by medicine. Chest, back, or muscle pain. People around you feel you are not acting correctly or are confused. Shortness of breath or difficulty breathing. Dizziness and fainting. You get a rash or develop hives. You have a decrease in urine output. Your urine turns a dark color or changes to pink, red, or brown. Any of the following symptoms occur over the next 10 days: You have a temperature by mouth above 102 F (38.9 C), not controlled by medicine. Shortness of breath. Weakness after normal activity. The white part of the eye turns yellow (jaundice). You have a decrease in the amount of urine or are urinating less often. Your urine turns a dark color or changes to pink, red, or brown. Document Released: 06/12/2000 Document Revised: 09/07/2011 Document Reviewed: 01/30/2008 [facility] Patient Information 2014 [place], [place].  _______________________________________________________________________

## 2021-06-19 ENCOUNTER — Other Ambulatory Visit: Payer: Self-pay

## 2021-06-19 ENCOUNTER — Encounter (HOSPITAL_COMMUNITY)
Admission: RE | Admit: 2021-06-19 | Discharge: 2021-06-19 | Disposition: A | Discharge status: Home / Self Care | Payer: PPO | Source: Ambulatory Visit | Attending: Orthopedic Surgery | Admitting: Orthopedic Surgery

## 2021-06-19 ENCOUNTER — Encounter (HOSPITAL_COMMUNITY): Payer: Self-pay

## 2021-06-19 VITALS — BP 151/68 | HR 60 | Temp 97.9°F | Resp 14 | Ht 70.5 in

## 2021-06-19 DIAGNOSIS — Z01818 Encounter for other preprocedural examination: Secondary | ICD-10-CM

## 2021-06-19 DIAGNOSIS — Z8546 Personal history of malignant neoplasm of prostate: Secondary | ICD-10-CM | POA: Diagnosis not present

## 2021-06-19 DIAGNOSIS — Z01812 Encounter for preprocedural laboratory examination: Secondary | ICD-10-CM | POA: Diagnosis not present

## 2021-06-19 DIAGNOSIS — G4733 Obstructive sleep apnea (adult) (pediatric): Secondary | ICD-10-CM | POA: Diagnosis not present

## 2021-06-19 DIAGNOSIS — I10 Essential (primary) hypertension: Secondary | ICD-10-CM | POA: Insufficient documentation

## 2021-06-19 DIAGNOSIS — I447 Left bundle-branch block, unspecified: Secondary | ICD-10-CM | POA: Diagnosis not present

## 2021-06-19 DIAGNOSIS — M1712 Unilateral primary osteoarthritis, left knee: Secondary | ICD-10-CM | POA: Diagnosis not present

## 2021-06-19 LAB — COMPREHENSIVE METABOLIC PANEL
ALT: 13 U/L (ref 0–44)
AST: 27 U/L (ref 15–41)
Albumin: 3.9 g/dL (ref 3.5–5.0)
Alkaline Phosphatase: 45 U/L (ref 38–126)
Anion gap: 6 (ref 5–15)
BUN: 28 mg/dL — ABNORMAL HIGH (ref 8–23)
CO2: 28 mmol/L (ref 22–32)
Calcium: 9.4 mg/dL (ref 8.9–10.3)
Chloride: 105 mmol/L (ref 98–111)
Creatinine, Ser: 1.32 mg/dL — ABNORMAL HIGH (ref 0.61–1.24)
GFR, Estimated: 52 mL/min — ABNORMAL LOW (ref >60)
Glucose, Bld: 95 mg/dL (ref 70–99)
Potassium: 4.9 mmol/L (ref 3.5–5.1)
Sodium: 139 mmol/L (ref 135–145)
Total Bilirubin: 0.9 mg/dL (ref 0.3–1.2)
Total Protein: 6.8 g/dL (ref 6.5–8.1)

## 2021-06-19 LAB — CBC
HCT: 36.7 % — ABNORMAL LOW (ref 39.0–52.0)
Hemoglobin: 11.8 g/dL — ABNORMAL LOW (ref 13.0–17.0)
MCH: 32 pg (ref 26.0–34.0)
MCHC: 32.2 g/dL (ref 30.0–36.0)
MCV: 99.5 fL (ref 80.0–100.0)
Platelets: 194 10*3/uL (ref 150–400)
RBC: 3.69 MIL/uL — ABNORMAL LOW (ref 4.22–5.81)
RDW: 13.2 % (ref 11.5–15.5)
WBC: 5.8 10*3/uL (ref 4.0–10.5)
nRBC: 0 % (ref 0.0–0.2)

## 2021-06-19 LAB — TYPE AND SCREEN
ABO/RH(D): A NEG
Antibody Screen: NEGATIVE

## 2021-06-19 LAB — SURGICAL PCR SCREEN
MRSA, PCR: NEGATIVE
Staphylococcus aureus: NEGATIVE

## 2021-06-20 ENCOUNTER — Ambulatory Visit (HOSPITAL_COMMUNITY): Payer: PPO | Attending: Cardiology

## 2021-06-20 DIAGNOSIS — Z79899 Other long term (current) drug therapy: Secondary | ICD-10-CM | POA: Insufficient documentation

## 2021-06-20 DIAGNOSIS — I350 Nonrheumatic aortic (valve) stenosis: Secondary | ICD-10-CM | POA: Diagnosis not present

## 2021-06-20 NOTE — Progress Notes (Addendum)
Anesthesia Chart Review   Case: [id number] Date/Time: 07/01/21 0945   Procedure: TOTAL KNEE ARTHROPLASTY (Left: Knee)   Anesthesia type: Spinal   Pre-op diagnosis: Left knee osteoarthritis   Location: [facility]OM 09 / [facility]   Surgeons: Paralee Cancel, MD       DISCUSSION:87 y.o. never smoker with h/o HTN, OSA, LBBB, AS (mean gradient 14.0 mmHg, valve area 1.18 cm2), prostate cancer, left knee OA scheduled for above procedure 07/01/2020 with Dr. Paralee Cancel.   Pt seen by cardiology 06/06/21. Per OV note, "Preop risk stratification   Pt denies symptoms of angina   No evid of CHF   In SR    Overall functional capacity is greater than 4 METS Pt has AS /AI   Would get echo to reevaluate gradients   prior to clearance"  Echo 06/20/2021 with EF 50-55%, mild to moderate AS stable, tricuspid valve regurgitation moderate.   Anticipate pt can proceed with planned procedure barring acute status change.   VS: BP (!) 151/68    Pulse 60    Temp 36.6 C (Oral)    Resp 14    Ht 5' 10.5" (1.791 m)    SpO2 100%    BMI 23.43 kg/m   PROVIDERS: Lawerance Cruel, MD is PCP   Dorris Carnes, MD is Cardiologist  LABS: Labs reviewed: Acceptable for surgery. (all labs ordered are listed, but only abnormal results are displayed)  Labs Reviewed  CBC - Abnormal; Notable for the following components:      Result Value   RBC 3.69 (*)    Hemoglobin 11.8 (*)    HCT 36.7 (*)    All other components within normal limits  COMPREHENSIVE METABOLIC PANEL - Abnormal; Notable for the following components:   BUN 28 (*)    Creatinine, Ser 1.32 (*)    GFR, Estimated 52 (*)    All other components within normal limits  SURGICAL PCR SCREEN  TYPE AND SCREEN     IMAGES:   EKG: 06/06/2021 Rate 59 bpm  Sinus bradycardia LBBB  CV: Echo 06/20/2021 1. Left ventricular ejection fraction, by estimation, is 50 to 55%. The  left ventricle has low normal function. The left ventricle has no regional  wall motion  abnormalities. Left ventricular diastolic parameters are  indeterminate.   2. Right ventricular systolic function is normal. The right ventricular  size is normal. There is normal pulmonary artery systolic pressure. The  estimated right ventricular systolic pressure is 81.8 mmHg.   3. Left atrial size was mildly dilated.   4. Right atrial size was moderately dilated.   5. The mitral valve is grossly normal. Mild mitral valve regurgitation.  No evidence of mitral stenosis. Moderate mitral annular calcification.   6. Tricuspid valve regurgitation is moderate.   7. The aortic valve is tricuspid. There is moderate calcification of the  aortic valve. There is moderate thickening of the aortic valve. Aortic  valve regurgitation is mild to moderate. Mild to moderate aortic valve  stenosis. Aortic valve area, by VTI  measures 1.20 cm. Aortic valve mean gradient measures 15.0 mmHg.   8. Aortic dilatation noted. There is borderline dilatation of the  ascending aorta, measuring 38 mm.   9. The inferior vena cava is normal in size with greater than 50%  respiratory variability, suggesting right atrial pressure of 3 mmHg.  Echo 07/08/2020 1. Left ventricular ejection fraction, by estimation, is 50 to 55%. The  left ventricle has low normal function. The left ventricle  has no regional  wall motion abnormalities. There is moderate concentric left ventricular  hypertrophy. Left ventricular  diastolic parameters are consistent with Grade I diastolic dysfunction  (impaired relaxation).   2. Right ventricular systolic function is normal. The right ventricular  size is normal.   3. The mitral valve is grossly normal. No evidence of mitral valve  regurgitation.   4. Tricuspid valve regurgitation is mild to moderate.   5. The aortic valve is tricuspid. There is moderate calcification of the  aortic valve. There is moderate thickening of the aortic valve. Aortic  valve regurgitation is mild to moderate.  Mild to moderate aortic valve  stenosis. Past Medical History:  Diagnosis Date   Aortic stenosis    Arthritis    Bell's palsy    mild, left   Heart murmur    History of shingles    2000   Hyperlipidemia    Hypertension    OSA (obstructive sleep apnea)    Pneumonia    as an infant   Post herpetic neuralgia    Prostate cancer    prostate cancer   Tremor     Past Surgical History:  Procedure Laterality Date   COLONOSCOPY     HERNIA REPAIR     INGUINAL HERNIA REPAIR Right 09/07/2017   Procedure: OPEN REPAIR RIGHT INGUINAL HERNIA;  Surgeon: Armandina Gemma, MD;  Location: [facility];  Service: General;  Laterality: Right;   INSERTION OF MESH Right 09/07/2017   Procedure: INSERTION OF MESH;  Surgeon: Armandina Gemma, MD;  Location: [facility];  Service: General;  Laterality: Right;   PROSTATE SURGERY     SPINAL CORD STIMULATOR INSERTION N/A 04/26/2015   Procedure: LUMBAR SPINAL CORD STIMULATOR INSERTION;  Surgeon: Clydell Hakim, MD;  [facility]: [facility];  Service: Neurosurgery;  Laterality: N/A;   SPINAL CORD STIMULATOR REMOVAL N/A 05/15/2016   Procedure: THORACIC SPINAL CORD STIMULATOR REMOVAL;  Surgeon: Clydell Hakim, MD;  Location: [place];  Service: Neurosurgery;  Laterality: N/A;  THORACIC SPINAL CORD STIMULATOR REMOVAL   TONSILLECTOMY      MEDICATIONS:  acetaminophen (TYLENOL) 650 MG CR tablet   aspirin EC 81 MG tablet   Bioflavonoid Products (GRAPE SEED PO)   Cholecalciferol (VITAMIN D3) 2000 units TABS   Coenzyme Q10 (CO Q-10) 200 MG CAPS   Cyanocobalamin (VITAMIN B-12) 5000 MCG LOZG   Glucosamine HCl 1500 MG TABS   Multiple Vitamin (MULTIVITAMIN WITH MINERALS) TABS tablet   OVER THE COUNTER MEDICATION   Polyethyl Glycol-Propyl Glycol (SYSTANE OP)   quinapril (ACCUPRIL) 40 MG tablet   rosuvastatin (CRESTOR) 20 MG tablet   traMADol (ULTRAM) 50 MG tablet   vitamin E 400 UNIT capsule   No current facility-administered medications for this encounter.    Konrad Felix Ward,  PA-C [facility] [phone]

## 2021-06-21 LAB — ECHOCARDIOGRAM COMPLETE
AR max vel: 1.11 cm2
AV Area VTI: 1.2 cm2
AV Area mean vel: 1.07 cm2
AV Mean grad: 15 mmHg
AV Peak grad: 26.4 mmHg
Ao pk vel: 2.57 m/s
Area-P 1/2: 3.37 cm2
P 1/2 time: 448 msec
S' Lateral: 3.3 cm

## 2021-06-24 DIAGNOSIS — D649 Anemia, unspecified: Secondary | ICD-10-CM | POA: Diagnosis not present

## 2021-06-26 DIAGNOSIS — R152 Fecal urgency: Secondary | ICD-10-CM | POA: Diagnosis not present

## 2021-06-26 DIAGNOSIS — R159 Full incontinence of feces: Secondary | ICD-10-CM | POA: Diagnosis not present

## 2021-06-26 NOTE — Anesthesia Preprocedure Evaluation (Addendum)
Anesthesia Evaluation    Airway Mallampati: II  TM Distance: >3 FB Neck ROM: Full    Dental no notable dental hx.    Pulmonary    Pulmonary exam normal breath sounds clear to auscultation       Cardiovascular hypertension, Normal cardiovascular exam+ Valvular Problems/Murmurs AS  Rhythm:Regular Rate:Normal + Systolic murmurs Left Ventricle: Left ventricular ejection fraction, by estimation, is 50  to 55%. The left ventricle has low normal function. The left ventricle has  no regional wall motion abnormalities. The left ventricular internal  cavity size was normal in size.  There is moderate concentric left ventricular hypertrophy. Left  ventricular diastolic parameters are consistent with Grade I diastolic  dysfunction (impaired relaxation).   Right Ventricle: The right ventricular size is normal. No increase in  right ventricular wall thickness. Right ventricular systolic function is  normal.   Left Atrium: Left atrial size was normal in size.   Right Atrium: Right atrial size was normal in size.   Pericardium: There is no evidence of pericardial effusion.   Mitral Valve: The mitral valve is grossly normal. Mild mitral annular  calcification. No evidence of mitral valve regurgitation.   Tricuspid Valve: The tricuspid valve is grossly normal. Tricuspid valve  regurgitation is mild to moderate.   Aortic Valve: Eccentric aortic regurgitation. The aortic valve is  tricuspid. There is moderate calcification of the aortic valve. There is  moderate thickening of the aortic valve. Aortic valve regurgitation is  mild to moderate. Aortic regurgitation PHT  measures 533 msec. Mild to moderate aortic stenosis is present. Aortic  valve mean gradient measures 14.0 mmHg. Aortic valve peak gradient  measures 24.1 mmHg. Aortic valve area, by VTI measures 1.18 cm.    Neuro/Psych    GI/Hepatic   Endo/Other    Renal/GU       Musculoskeletal  (+) Arthritis , Osteoarthritis,    Abdominal   Peds  Hematology   Anesthesia Other Findings   Reproductive/Obstetrics                            Anesthesia Physical Anesthesia Plan  ASA: 3  Anesthesia Plan: General   Post-op Pain Management: Regional block   Induction: Intravenous  PONV Risk Score and Plan: 2  Airway Management Planned: LMA  Additional Equipment:   Intra-op Plan:   Post-operative Plan: Extubation in OR  Informed Consent: I have reviewed the patients History and Physical, chart, labs and discussed the procedure including the risks, benefits and alternatives for the proposed anesthesia with the patient or authorized representative who has indicated his/her understanding and acceptance.     Dental advisory given  Plan Discussed with: CRNA and Surgeon  Anesthesia Plan Comments: (See PAT note 06/19/21, Konrad Felix Ward, PA-C)       Anesthesia Quick Evaluation

## 2021-06-27 ENCOUNTER — Encounter (HOSPITAL_COMMUNITY)
Admission: RE | Admit: 2021-06-27 | Discharge: 2021-06-27 | Disposition: A | Discharge status: Home / Self Care | Payer: PPO | Source: Ambulatory Visit | Attending: Orthopedic Surgery | Admitting: Orthopedic Surgery

## 2021-06-27 ENCOUNTER — Other Ambulatory Visit: Payer: Self-pay

## 2021-06-27 ENCOUNTER — Encounter (HOSPITAL_COMMUNITY): Payer: PPO

## 2021-06-27 DIAGNOSIS — Z20822 Contact with and (suspected) exposure to covid-19: Secondary | ICD-10-CM | POA: Insufficient documentation

## 2021-06-27 DIAGNOSIS — Z01818 Encounter for other preprocedural examination: Secondary | ICD-10-CM

## 2021-06-27 DIAGNOSIS — Z01812 Encounter for preprocedural laboratory examination: Secondary | ICD-10-CM | POA: Insufficient documentation

## 2021-06-27 LAB — SARS CORONAVIRUS 2 (TAT 6-24 HRS): SARS Coronavirus 2: NEGATIVE

## 2021-06-30 ENCOUNTER — Encounter (HOSPITAL_COMMUNITY): Payer: Self-pay | Admitting: Orthopedic Surgery

## 2021-06-30 NOTE — H&P (Signed)
TOTAL KNEE ADMISSION H&P  Patient is being admitted for left total knee arthroplasty.  Subjective:  Chief Complaint:left knee pain.  HPI: Stephen Nunez, 88 y.o. male, has a history of pain and functional disability in the left knee due to arthritis and has failed non-surgical conservative treatments for greater than 12 weeks to includeNSAID's and/or analgesics, corticosteriod injections, and activity modification.  Onset of symptoms was gradual, starting 2 years ago with gradually worsening course since that time. The patient noted no past surgery on the left knee(s).  Patient currently rates pain in the left knee(s) at 8 out of 10 with activity. Patient has worsening of pain with activity and weight bearing, pain that interferes with activities of daily living, and pain with passive range of motion.  Patient has evidence of joint space narrowing by imaging studies.  There is no active infection.  Patient Active Problem List   Diagnosis Date Noted   Arthralgia of shoulder 03/05/2020   Malignant neoplasm of prostate ([place]) 03/05/2020   Hypercholesterolemia without hypertriglyceridemia 03/05/2020   Motion sickness 03/05/2020   Right inguinal hernia 09/05/2017   Chronic pain of left knee 08/25/2017   HTN (hypertension) 03/19/2016   History of prostate cancer 03/19/2016   Chronic pain syndrome 03/19/2016   History of shingles 03/19/2016   Encounter for general adult medical examination with abnormal findings 07/16/2014   Post herpetic neuralgia 09/21/2013   Past Medical History:  Diagnosis Date   Aortic stenosis    Arthritis    Bell's palsy    mild, left   Heart murmur    History of shingles    2000   Hyperlipidemia    Hypertension    OSA (obstructive sleep apnea)    Pneumonia    as an infant   Post herpetic neuralgia    Prostate cancer    prostate cancer   Tremor     Past Surgical History:  Procedure Laterality Date   COLONOSCOPY     HERNIA REPAIR     INGUINAL HERNIA  REPAIR Right 09/07/2017   Procedure: OPEN REPAIR RIGHT INGUINAL HERNIA;  Surgeon: Armandina Gemma, MD;  Location[facility];  Service: General;  Laterality: Right;   INSERTION OF MESH Right 09/07/2017   Procedure: INSERTION OF MESH;  Surgeon: Armandina Gemma, MD;  Location[facility];  Service: General;  Laterality: Right;   PROSTATE SURGERY     SPINAL CORD STIMULATOR INSERTION N/A 04/26/2015   Procedure: LUMBAR SPINAL CORD STIMULATOR INSERTION;  Surgeon: Clydell Hakim, MD;  Location[facility];  Service: Neurosurgery;  Laterality: N/A;   SPINAL CORD STIMULATOR REMOVAL N/A 05/15/2016   Procedure: THORACIC SPINAL CORD STIMULATOR REMOVAL;  Surgeon: Clydell Hakim, MD;  Location: [place];  Service: Neurosurgery;  Laterality: N/A;  THORACIC SPINAL CORD STIMULATOR REMOVAL   TONSILLECTOMY      No current facility-administered medications for this encounter.   Current Outpatient Medications  Medication Sig Dispense Refill Last Dose   acetaminophen (TYLENOL) 650 MG CR tablet Take 1,300 mg by mouth every 8 (eight) hours as needed for pain.      aspirin EC 81 MG tablet Take 81 mg by mouth every other day.      Bioflavonoid Products (GRAPE SEED PO) Take 2 capsules by mouth every other day. [place] Grape Capsules      Cholecalciferol (VITAMIN D3) 2000 units TABS Take 2,000 Units by mouth every other day.      Coenzyme Q10 (CO Q-10) 200 MG CAPS Take 200 mg by  mouth every other day.      Cyanocobalamin (VITAMIN B-12) 5000 MCG LOZG Take 5,000 mcg by mouth every other day.      Glucosamine HCl 1500 MG TABS Take 1,500 mg by mouth every other day.      Multiple Vitamin (MULTIVITAMIN WITH MINERALS) TABS tablet Take 1 tablet by mouth every other day.      OVER THE COUNTER MEDICATION Apply 1 application topically 4 (four) times daily as needed (for back pain due to shingles.). CBD CREAM (HEMP)      Polyethyl Glycol-Propyl Glycol (SYSTANE OP) Place 1 drop into both eyes 2 (two) times daily as needed (dry eyes).      quinapril  (ACCUPRIL) 40 MG tablet Take 40 mg by mouth daily.      rosuvastatin (CRESTOR) 20 MG tablet Take 1 tablet (20 mg total) by mouth daily. 90 tablet 1    traMADol (ULTRAM) 50 MG tablet Take 1-2 tablets (50-100 mg total) by mouth every 6 (six) hours as needed for moderate pain. 15 tablet 0    vitamin E 400 UNIT capsule Take 400 Units by mouth every other day.      No Known Allergies  Social History   Tobacco Use   Smoking status: Never   Smokeless tobacco: Never  Substance Use Topics   Alcohol use: No    Family History  Problem Relation Age of Onset   Heart attack Father    Other Mother        Post-op complications, deceased 4   Cancer Mother    Heart attack Brother        Deceased 41   Stroke Brother    Multiple sclerosis Brother        Deceased, 23     Review of Systems  Constitutional:  Negative for chills and fever.  Respiratory:  Negative for cough and shortness of breath.   Cardiovascular:  Negative for chest pain.  Gastrointestinal:  Negative for nausea and vomiting.  Musculoskeletal:  Positive for arthralgias.    Objective:  Physical Exam Well nourished and well developed. General: Alert and oriented x3, cooperative and pleasant, no acute distress. Head: normocephalic, atraumatic, neck supple. Eyes: EOMI.  Musculoskeletal: Left knee exam: At least 5 degree flexion contracture associated with a 5 to 10 degree varus deformity of the left knee related to his osteoarthritis Flexion limited to 110 degrees with tightness Tenderness mainly over the medial and anterior aspect knee  Calves soft and nontender. Motor function intact in LE. Strength 5/5 LE bilaterally. Neuro: Distal pulses 2+. Sensation to light touch intact in LE.  Vital signs in last 24 hours:    Labs:   Estimated body mass index is 23.43 kg/m as calculated from the following:   Height as of 06/19/21: 5' 10.5" (1.791 m).   Weight as of 06/06/21: 75.1 kg.   Imaging Review Plain radiographs  demonstrate severe degenerative joint disease of the left knee(s). The overall alignment isneutral. The bone quality appears to be adequate for age and reported activity level.      Assessment/Plan:  End stage arthritis, left knee   The patient history, physical examination, clinical judgment of the provider and imaging studies are consistent with end stage degenerative joint disease of the left knee(s) and total knee arthroplasty is deemed medically necessary. The treatment options including medical management, injection therapy arthroscopy and arthroplasty were discussed at length. The risks and benefits of total knee arthroplasty were presented and reviewed. The risks due to  aseptic loosening, infection, stiffness, patella tracking problems, thromboembolic complications and other imponderables were discussed. The patient acknowledged the explanation, agreed to proceed with the plan and consent was signed. Patient is being admitted for inpatient treatment for surgery, pain control, PT, OT, prophylactic antibiotics, VTE prophylaxis, progressive ambulation and ADL's and discharge planning. The patient is planning to be discharged  home.  Therapy Plans: outpatient therapy at [facility] Disposition: Home with girlfriend Planned DVT Prophylaxis: aspirin 81mg  BID DME needed: none PCP: Dr. Harrington Challenger, Cardiologist: Dr. Harrington Challenger, clearance received TXA: IV Allergies: NKDA Anesthesia Concerns: none BMI: 23.1 Last HgbA1c: Not diabetic  Other: - Norco q6h (no oxy), robaxin, tylenol, NSAIDs? based on cr - Very motivated & independent at home, girlfriend has had bilateral TKAs   Patient's anticipated LOS is less than 2 midnights, meeting these requirements: - Younger than 22 - Lives within 1 hour of care - Has a competent adult at home to recover with post-op recover - NO history of  - Chronic pain requiring opiods  - Diabetes  - Coronary Artery Disease  - Heart failure  - Heart attack  -  Stroke  - DVT/VTE  - Cardiac arrhythmia  - Respiratory Failure/COPD  - Renal failure  - Anemia  - Advanced Liver disease  Costella Hatcher, PA-C Orthopedic Surgery [facility] [place] [phone]

## 2021-07-01 ENCOUNTER — Other Ambulatory Visit: Payer: Self-pay

## 2021-07-01 ENCOUNTER — Ambulatory Visit (HOSPITAL_COMMUNITY): Payer: PPO | Admitting: Physician Assistant

## 2021-07-01 ENCOUNTER — Ambulatory Visit (HOSPITAL_COMMUNITY): Payer: PPO | Admitting: Anesthesiology

## 2021-07-01 ENCOUNTER — Observation Stay (HOSPITAL_COMMUNITY)
Admission: RE | Admit: 2021-07-01 | Discharge: 2021-07-03 | Disposition: A | Discharge status: Home / Self Care | Payer: PPO | Attending: Orthopedic Surgery | Admitting: Orthopedic Surgery

## 2021-07-01 ENCOUNTER — Encounter (HOSPITAL_COMMUNITY): Payer: Self-pay | Admitting: Orthopedic Surgery

## 2021-07-01 ENCOUNTER — Encounter (HOSPITAL_COMMUNITY)
Admission: RE | Disposition: A | Discharge status: Home / Self Care | Payer: Self-pay | Source: Home / Self Care | Attending: Orthopedic Surgery

## 2021-07-01 DIAGNOSIS — G8918 Other acute postprocedural pain: Secondary | ICD-10-CM | POA: Diagnosis not present

## 2021-07-01 DIAGNOSIS — G4733 Obstructive sleep apnea (adult) (pediatric): Secondary | ICD-10-CM | POA: Diagnosis not present

## 2021-07-01 DIAGNOSIS — Z7982 Long term (current) use of aspirin: Secondary | ICD-10-CM | POA: Insufficient documentation

## 2021-07-01 DIAGNOSIS — M1712 Unilateral primary osteoarthritis, left knee: Principal | ICD-10-CM | POA: Insufficient documentation

## 2021-07-01 DIAGNOSIS — E7800 Pure hypercholesterolemia, unspecified: Secondary | ICD-10-CM | POA: Diagnosis not present

## 2021-07-01 DIAGNOSIS — Z79899 Other long term (current) drug therapy: Secondary | ICD-10-CM | POA: Diagnosis not present

## 2021-07-01 DIAGNOSIS — I10 Essential (primary) hypertension: Secondary | ICD-10-CM | POA: Diagnosis not present

## 2021-07-01 DIAGNOSIS — Z96652 Presence of left artificial knee joint: Secondary | ICD-10-CM

## 2021-07-01 DIAGNOSIS — Z8546 Personal history of malignant neoplasm of prostate: Secondary | ICD-10-CM | POA: Insufficient documentation

## 2021-07-01 HISTORY — PX: TOTAL KNEE ARTHROPLASTY: SHX125

## 2021-07-01 LAB — ABO/RH: ABO/RH(D): A NEG

## 2021-07-01 SURGERY — ARTHROPLASTY, KNEE, TOTAL
Anesthesia: General | Site: Knee | Laterality: Left

## 2021-07-01 MED ORDER — PROPOFOL 500 MG/50ML IV EMUL
INTRAVENOUS | Status: AC
  Filled 2021-07-01: qty 50

## 2021-07-01 MED ORDER — DEXAMETHASONE SODIUM PHOSPHATE 10 MG/ML IJ SOLN: 8.0000 mg | Freq: Once | INTRAMUSCULAR | Status: DC

## 2021-07-01 MED ORDER — SODIUM CHLORIDE 0.9 % IR SOLN
Status: DC | PRN
  Administered 2021-07-01: 1000 mL

## 2021-07-01 MED ORDER — PROPOFOL 10 MG/ML IV BOLUS
INTRAVENOUS | Status: AC
  Filled 2021-07-01: qty 20

## 2021-07-01 MED ORDER — ACETAMINOPHEN 325 MG PO TABS: 325.0000 mg | ORAL_TABLET | Freq: Four times a day (QID) | ORAL | Status: DC | PRN

## 2021-07-01 MED ORDER — EPHEDRINE SULFATE-NACL 50-0.9 MG/10ML-% IV SOSY
PREFILLED_SYRINGE | INTRAVENOUS | Status: DC | PRN
  Administered 2021-07-01 (×3): 5 mg via INTRAVENOUS

## 2021-07-01 MED ORDER — KETOROLAC TROMETHAMINE 30 MG/ML IJ SOLN
INTRAMUSCULAR | Status: AC
  Filled 2021-07-01: qty 1

## 2021-07-01 MED ORDER — BUPIVACAINE-EPINEPHRINE (PF) 0.25% -1:200000 IJ SOLN
INTRAMUSCULAR | Status: AC
  Filled 2021-07-01: qty 30

## 2021-07-01 MED ORDER — FENTANYL CITRATE (PF) 100 MCG/2ML IJ SOLN
INTRAMUSCULAR | Status: AC
  Filled 2021-07-01: qty 2

## 2021-07-01 MED ORDER — LIDOCAINE HCL (PF) 2 % IJ SOLN
INTRAMUSCULAR | Status: AC
  Filled 2021-07-01: qty 10

## 2021-07-01 MED ORDER — ONDANSETRON HCL 4 MG/2ML IJ SOLN
INTRAMUSCULAR | Status: DC | PRN
Start: 2021-07-01 — End: 2021-07-01
  Administered 2021-07-01: 4 mg via INTRAVENOUS

## 2021-07-01 MED ORDER — FENTANYL CITRATE PF 50 MCG/ML IJ SOSY
PREFILLED_SYRINGE | INTRAMUSCULAR | Status: AC
  Administered 2021-07-01: 50 ug
  Filled 2021-07-01: qty 1

## 2021-07-01 MED ORDER — SODIUM CHLORIDE (PF) 0.9 % IJ SOLN
INTRAMUSCULAR | Status: DC | PRN
  Administered 2021-07-01: 30 mL

## 2021-07-01 MED ORDER — METOCLOPRAMIDE HCL 5 MG PO TABS
5.0000 mg | ORAL_TABLET | Freq: Three times a day (TID) | ORAL | Status: DC | PRN
  Filled 2021-07-01: qty 2

## 2021-07-01 MED ORDER — TRANEXAMIC ACID-NACL 1000-0.7 MG/100ML-% IV SOLN
1000.0000 mg | INTRAVENOUS | Status: AC
  Administered 2021-07-01: 1000 mg via INTRAVENOUS
  Filled 2021-07-01: qty 100

## 2021-07-01 MED ORDER — CEFAZOLIN SODIUM-DEXTROSE 2-4 GM/100ML-% IV SOLN
INTRAVENOUS | Status: AC
  Filled 2021-07-01: qty 100

## 2021-07-01 MED ORDER — OXYCODONE HCL 5 MG/5ML PO SOLN: 5.0000 mg | Freq: Once | ORAL | Status: DC | PRN

## 2021-07-01 MED ORDER — QUINAPRIL HCL 10 MG PO TABS
40.0000 mg | ORAL_TABLET | Freq: Every day | ORAL | Status: DC
  Filled 2021-07-01 (×2): qty 4

## 2021-07-01 MED ORDER — ORAL CARE MOUTH RINSE: 15.0000 mL | Freq: Once | OROMUCOSAL | Status: AC

## 2021-07-01 MED ORDER — OXYCODONE HCL 5 MG PO TABS: 5.0000 mg | ORAL_TABLET | Freq: Once | ORAL | Status: DC | PRN

## 2021-07-01 MED ORDER — MENTHOL 3 MG MT LOZG: 1.0000 | LOZENGE | OROMUCOSAL | Status: DC | PRN

## 2021-07-01 MED ORDER — ROPIVACAINE HCL 5 MG/ML IJ SOLN
INTRAMUSCULAR | Status: DC | PRN
  Administered 2021-07-01: 20 mL via PERINEURAL

## 2021-07-01 MED ORDER — ROSUVASTATIN CALCIUM 20 MG PO TABS
20.0000 mg | ORAL_TABLET | Freq: Every day | ORAL | Status: DC
  Administered 2021-07-02 – 2021-07-03 (×2): 20 mg via ORAL
  Filled 2021-07-01 (×2): qty 1

## 2021-07-01 MED ORDER — DEXAMETHASONE SODIUM PHOSPHATE 10 MG/ML IJ SOLN
10.0000 mg | Freq: Once | INTRAMUSCULAR | Status: AC
  Administered 2021-07-02: 10 mg via INTRAVENOUS
  Filled 2021-07-01: qty 1

## 2021-07-01 MED ORDER — HYDROMORPHONE HCL 1 MG/ML IJ SOLN: 0.2500 mg | INTRAMUSCULAR | Status: DC | PRN

## 2021-07-01 MED ORDER — ASPIRIN 81 MG PO CHEW
81.0000 mg | CHEWABLE_TABLET | Freq: Two times a day (BID) | ORAL | Status: DC
  Administered 2021-07-01 – 2021-07-03 (×4): 81 mg via ORAL
  Filled 2021-07-01 (×4): qty 1

## 2021-07-01 MED ORDER — 0.9 % SODIUM CHLORIDE (POUR BTL) OPTIME
TOPICAL | Status: DC | PRN
  Administered 2021-07-01: 1000 mL

## 2021-07-01 MED ORDER — DOCUSATE SODIUM 100 MG PO CAPS
100.0000 mg | ORAL_CAPSULE | Freq: Two times a day (BID) | ORAL | Status: DC
  Administered 2021-07-01 – 2021-07-03 (×4): 100 mg via ORAL
  Filled 2021-07-01 (×4): qty 1

## 2021-07-01 MED ORDER — BISACODYL 10 MG RE SUPP: 10.0000 mg | Freq: Every day | RECTAL | Status: DC | PRN

## 2021-07-01 MED ORDER — MORPHINE SULFATE (PF) 2 MG/ML IV SOLN: 0.5000 mg | INTRAVENOUS | Status: DC | PRN

## 2021-07-01 MED ORDER — BUPIVACAINE-EPINEPHRINE (PF) 0.25% -1:200000 IJ SOLN
INTRAMUSCULAR | Status: DC | PRN
  Administered 2021-07-01: 30 mL

## 2021-07-01 MED ORDER — FERROUS SULFATE 325 (65 FE) MG PO TABS
325.0000 mg | ORAL_TABLET | Freq: Three times a day (TID) | ORAL | Status: DC
  Administered 2021-07-02 – 2021-07-03 (×4): 325 mg via ORAL
  Filled 2021-07-01 (×4): qty 1

## 2021-07-01 MED ORDER — FENTANYL CITRATE (PF) 100 MCG/2ML IJ SOLN
INTRAMUSCULAR | Status: DC | PRN
  Administered 2021-07-01 (×4): 25 ug via INTRAVENOUS

## 2021-07-01 MED ORDER — METHOCARBAMOL 500 MG PO TABS
500.0000 mg | ORAL_TABLET | Freq: Four times a day (QID) | ORAL | Status: DC | PRN
  Administered 2021-07-02 (×2): 500 mg via ORAL
  Filled 2021-07-01 (×2): qty 1

## 2021-07-01 MED ORDER — SODIUM CHLORIDE 0.9 % IV SOLN: INTRAVENOUS | Status: DC

## 2021-07-01 MED ORDER — ONDANSETRON HCL 4 MG PO TABS
4.0000 mg | ORAL_TABLET | Freq: Four times a day (QID) | ORAL | Status: DC | PRN
  Filled 2021-07-01: qty 1

## 2021-07-01 MED ORDER — HYDROCODONE-ACETAMINOPHEN 5-325 MG PO TABS
1.0000 | ORAL_TABLET | ORAL | Status: DC | PRN
  Administered 2021-07-02: 1 via ORAL
  Administered 2021-07-02 (×2): 2 via ORAL
  Filled 2021-07-01 (×3): qty 1
  Filled 2021-07-01: qty 2

## 2021-07-01 MED ORDER — KETOROLAC TROMETHAMINE 30 MG/ML IJ SOLN
INTRAMUSCULAR | Status: DC | PRN
  Administered 2021-07-01: 30 mg via INTRA_ARTICULAR

## 2021-07-01 MED ORDER — EPHEDRINE 5 MG/ML INJ
INTRAVENOUS | Status: AC
  Filled 2021-07-01: qty 5

## 2021-07-01 MED ORDER — LACTATED RINGERS IV SOLN: INTRAVENOUS | Status: DC

## 2021-07-01 MED ORDER — ONDANSETRON HCL 4 MG/2ML IJ SOLN
4.0000 mg | Freq: Four times a day (QID) | INTRAMUSCULAR | Status: DC | PRN
  Administered 2021-07-01 (×2): 4 mg via INTRAVENOUS
  Filled 2021-07-01: qty 2

## 2021-07-01 MED ORDER — HYDROCODONE-ACETAMINOPHEN 7.5-325 MG PO TABS
1.0000 | ORAL_TABLET | ORAL | Status: DC | PRN
  Administered 2021-07-03: 2 via ORAL
  Filled 2021-07-01: qty 2

## 2021-07-01 MED ORDER — PHENOL 1.4 % MT LIQD: 1.0000 | OROMUCOSAL | Status: DC | PRN

## 2021-07-01 MED ORDER — DEXAMETHASONE SODIUM PHOSPHATE 10 MG/ML IJ SOLN
INTRAMUSCULAR | Status: DC | PRN
  Administered 2021-07-01: 4 mg via INTRAVENOUS

## 2021-07-01 MED ORDER — DIPHENHYDRAMINE HCL 12.5 MG/5ML PO ELIX: 12.5000 mg | ORAL_SOLUTION | ORAL | Status: DC | PRN

## 2021-07-01 MED ORDER — METHOCARBAMOL 500 MG IVPB - SIMPLE MED
500.0000 mg | Freq: Four times a day (QID) | INTRAVENOUS | Status: DC | PRN
  Filled 2021-07-01: qty 50

## 2021-07-01 MED ORDER — TRANEXAMIC ACID-NACL 1000-0.7 MG/100ML-% IV SOLN
1000.0000 mg | Freq: Once | INTRAVENOUS | Status: AC
  Administered 2021-07-01: 1000 mg via INTRAVENOUS

## 2021-07-01 MED ORDER — CEFAZOLIN SODIUM-DEXTROSE 2-4 GM/100ML-% IV SOLN
2.0000 g | Freq: Four times a day (QID) | INTRAVENOUS | Status: AC
  Administered 2021-07-01 (×2): 2 g via INTRAVENOUS
  Filled 2021-07-01: qty 100

## 2021-07-01 MED ORDER — SUCCINYLCHOLINE CHLORIDE 200 MG/10ML IV SOSY
PREFILLED_SYRINGE | INTRAVENOUS | Status: AC
  Filled 2021-07-01: qty 10

## 2021-07-01 MED ORDER — PROMETHAZINE HCL 25 MG/ML IJ SOLN: 12.5000 mg | Freq: Once | INTRAMUSCULAR | Status: DC | PRN

## 2021-07-01 MED ORDER — TRANEXAMIC ACID-NACL 1000-0.7 MG/100ML-% IV SOLN
INTRAVENOUS | Status: AC
  Filled 2021-07-01: qty 100

## 2021-07-01 MED ORDER — METOCLOPRAMIDE HCL 5 MG/ML IJ SOLN
5.0000 mg | Freq: Three times a day (TID) | INTRAMUSCULAR | Status: DC | PRN
  Administered 2021-07-01: 10 mg via INTRAVENOUS
  Filled 2021-07-01: qty 2

## 2021-07-01 MED ORDER — HYDROMORPHONE HCL 1 MG/ML IJ SOLN
INTRAMUSCULAR | Status: DC | PRN
  Administered 2021-07-01 (×2): .5 mg via INTRAVENOUS

## 2021-07-01 MED ORDER — CEFAZOLIN SODIUM-DEXTROSE 2-4 GM/100ML-% IV SOLN
2.0000 g | INTRAVENOUS | Status: AC
  Administered 2021-07-01: 2 g via INTRAVENOUS
  Filled 2021-07-01: qty 100

## 2021-07-01 MED ORDER — HYDROMORPHONE HCL 2 MG/ML IJ SOLN
INTRAMUSCULAR | Status: AC
  Filled 2021-07-01: qty 1

## 2021-07-01 MED ORDER — LIDOCAINE 2% (20 MG/ML) 5 ML SYRINGE
INTRAMUSCULAR | Status: DC | PRN
  Administered 2021-07-01: 50 mg via INTRAVENOUS

## 2021-07-01 MED ORDER — PROPOFOL 10 MG/ML IV BOLUS
INTRAVENOUS | Status: DC | PRN
  Administered 2021-07-01: 80 mg via INTRAVENOUS

## 2021-07-01 MED ORDER — ONDANSETRON HCL 4 MG/2ML IJ SOLN
INTRAMUSCULAR | Status: AC
  Filled 2021-07-01: qty 2

## 2021-07-01 MED ORDER — POLYETHYLENE GLYCOL 3350 17 G PO PACK: 17.0000 g | PACK | Freq: Every day | ORAL | Status: DC | PRN

## 2021-07-01 MED ORDER — CHLORHEXIDINE GLUCONATE 0.12 % MT SOLN
15.0000 mL | Freq: Once | OROMUCOSAL | Status: AC
  Administered 2021-07-01: 15 mL via OROMUCOSAL

## 2021-07-01 MED ORDER — POVIDONE-IODINE 10 % EX SWAB
2.0000 "application " | Freq: Once | CUTANEOUS | Status: AC
  Administered 2021-07-01: 2 via TOPICAL

## 2021-07-01 SURGICAL SUPPLY — 52 items
ATTUNE MED ANAT PAT 41 KNEE (Knees) ×1 IMPLANT
ATTUNE PS FEM LT SZ 7 CEM KNEE (Femur) ×1 IMPLANT
ATTUNE PSRP INSR SZ7 10 KNEE (Insert) ×1 IMPLANT
BAG COUNTER SPONGE SURGICOUNT (BAG) IMPLANT
BAG ZIPLOCK 12X15 (MISCELLANEOUS) IMPLANT
BASE TIBIAL ROT PLAT SZ 7 KNEE (Knees) IMPLANT
BLADE SAW SGTL 11.0X1.19X90.0M (BLADE) IMPLANT
BLADE SAW SGTL 13.0X1.19X90.0M (BLADE) ×2 IMPLANT
BLADE SURG SZ10 CARB STEEL (BLADE) ×4 IMPLANT
BNDG ELASTIC 4X5.8 VLCR STR LF (GAUZE/BANDAGES/DRESSINGS) ×1 IMPLANT
BNDG ELASTIC 6X5.8 VLCR STR LF (GAUZE/BANDAGES/DRESSINGS) ×2 IMPLANT
BOWL SMART MIX CTS (DISPOSABLE) ×2 IMPLANT
CEMENT HV SMART SET (Cement) ×2 IMPLANT
CUFF TOURN SGL QUICK 34 (TOURNIQUET CUFF) ×2
CUFF TRNQT CYL 34X4.125X (TOURNIQUET CUFF) ×1 IMPLANT
DECANTER SPIKE VIAL GLASS SM (MISCELLANEOUS) ×4 IMPLANT
DERMABOND ADVANCED (GAUZE/BANDAGES/DRESSINGS) ×1
DERMABOND ADVANCED .7 DNX12 (GAUZE/BANDAGES/DRESSINGS) ×1 IMPLANT
DRAPE INCISE IOBAN 66X45 STRL (DRAPES) ×2 IMPLANT
DRAPE U-SHAPE 47X51 STRL (DRAPES) ×2 IMPLANT
DRESSING AQUACEL AG SP 3.5X10 (GAUZE/BANDAGES/DRESSINGS) ×1 IMPLANT
DRSG AQUACEL AG ADV 3.5X10 (GAUZE/BANDAGES/DRESSINGS) ×1 IMPLANT
DRSG AQUACEL AG SP 3.5X10 (GAUZE/BANDAGES/DRESSINGS) ×2
DURAPREP 26ML APPLICATOR (WOUND CARE) ×4 IMPLANT
ELECT REM PT RETURN 15FT ADLT (MISCELLANEOUS) ×2 IMPLANT
GLOVE SURG ENC MOIS LTX SZ6 (GLOVE) ×2 IMPLANT
GLOVE SURG ENC MOIS LTX SZ7 (GLOVE) ×2 IMPLANT
GLOVE SURG UNDER POLY LF SZ7.5 (GLOVE) ×2 IMPLANT
GOWN STRL REUS W/TWL LRG LVL3 (GOWN DISPOSABLE) ×2 IMPLANT
HANDPIECE INTERPULSE COAX TIP (DISPOSABLE) ×2
HOLDER FOLEY CATH W/STRAP (MISCELLANEOUS) IMPLANT
KIT TURNOVER KIT A (KITS) IMPLANT
MANIFOLD NEPTUNE II (INSTRUMENTS) ×2 IMPLANT
NDL SAFETY ECLIPSE 18X1.5 (NEEDLE) IMPLANT
NEEDLE HYPO 18GX1.5 SHARP (NEEDLE)
NS IRRIG 1000ML POUR BTL (IV SOLUTION) ×2 IMPLANT
PACK TOTAL KNEE CUSTOM (KITS) ×2 IMPLANT
PROTECTOR NERVE ULNAR (MISCELLANEOUS) ×2 IMPLANT
SET HNDPC FAN SPRY TIP SCT (DISPOSABLE) ×1 IMPLANT
SET PAD KNEE POSITIONER (MISCELLANEOUS) ×2 IMPLANT
SPONGE T-LAP 18X18 ~~LOC~~+RFID (SPONGE) ×6 IMPLANT
SUT MNCRL AB 4-0 PS2 18 (SUTURE) ×2 IMPLANT
SUT STRATAFIX PDS+ 0 24IN (SUTURE) ×2 IMPLANT
SUT VIC AB 1 CT1 36 (SUTURE) ×3 IMPLANT
SUT VIC AB 2-0 CT1 27 (SUTURE) ×6
SUT VIC AB 2-0 CT1 TAPERPNT 27 (SUTURE) ×3 IMPLANT
SYR 3ML LL SCALE MARK (SYRINGE) ×2 IMPLANT
TIBIAL BASE ROT PLAT SZ 7 KNEE (Knees) ×2 IMPLANT
TRAY FOLEY MTR SLVR 16FR STAT (SET/KITS/TRAYS/PACK) ×2 IMPLANT
TUBE SUCTION HIGH CAP CLEAR NV (SUCTIONS) ×2 IMPLANT
WATER STERILE IRR 1000ML POUR (IV SOLUTION) ×4 IMPLANT
WRAP KNEE MAXI GEL POST OP (GAUZE/BANDAGES/DRESSINGS) ×2 IMPLANT

## 2021-07-01 NOTE — Anesthesia Procedure Notes (Signed)
Anesthesia Regional Block: Adductor canal block   Pre-Anesthetic Checklist: , timeout performed,  Correct Patient, Correct Site, Correct Laterality,  Correct Procedure, Correct Position, site marked,  Risks and benefits discussed,  Surgical consent,  Pre-op evaluation,  At surgeon's request and post-op pain management  Laterality: Left  Prep: chloraprep       Needles:  Injection technique: Single-shot  Needle Type: Echogenic Needle     Needle Length: 9cm      Additional Needles:   Procedures:,,,, ultrasound used (permanent image in chart),,    Narrative:  Start time: 07/01/2021 9:39 AM End time: 07/01/2021 9:46 AM Injection made incrementally with aspirations every 5 mL.  Performed by: Personally  Anesthesiologist: Myrtie Soman, MD  Additional Notes: Patient tolerated the procedure well without complications

## 2021-07-01 NOTE — Evaluation (Signed)
Physical Therapy Evaluation Patient Details Name: Stephen Nunez MRN: [id number] DOB: 1932-04-28 Today's Date: 07/01/2021  History of Present Illness  Pt is 88 yo male s/p L TKA on 07/01/20.  Pt with hx including prostate CA, HCL, chronic pain, shingles resulting in tremors, L Bell's Palsy, HTN  Clinical Impression  Pt is s/p TKA resulting in the deficits listed below (see PT Problem List). At baseline, pt is independent with all activities other than driving, he does use a cane when out of the house.  Pt has baseline tremors in UE and chronic pain in L shoulder/upper back that hurts when touched from prior shingles virus.  Today, he was able to ambulate some in room but had several episodes of emesis that further limited.  Pt having difficulty with terminal knee ext and frequent cues for RW proximity. Pt expected to progress well with therapy - suspect will need at least 2 sessions tomorrow prior to d/c.  Pt will benefit from skilled PT to increase their independence and safety with mobility to allow discharge to the venue listed below.         Recommendations for follow up therapy are one component of a multi-disciplinary discharge planning process, led by the attending physician.  Recommendations may be updated based on patient status, additional functional criteria and insurance authorization.  Follow Up Recommendations Follow physician's recommendations for discharge plan and follow up therapies    Assistance Recommended at Discharge Frequent or constant Supervision/Assistance  Patient can return home with the following  A little help with walking and/or transfers;A little help with bathing/dressing/bathroom;Assistance with cooking/housework;Assist for transportation;Help with stairs or ramp for entrance    Equipment Recommendations None recommended by PT  Recommendations for Other Services       Functional Status Assessment Patient has had a recent decline in their functional status and  demonstrates the ability to make significant improvements in function in a reasonable and predictable amount of time.     Precautions / Restrictions Precautions Precautions: Fall Restrictions Weight Bearing Restrictions: Yes LLE Weight Bearing: Weight bearing as tolerated      Mobility  Bed Mobility Overal bed mobility: Needs Assistance Bed Mobility: Supine to Sit;Sit to Supine     Supine to sit: Min assist Sit to supine: Min assist   General bed mobility comments: min A for L LE; cues for sequencing    Transfers Overall transfer level: Needs assistance Equipment used: Rolling walker (2 wheels) Transfers: Sit to/from Stand Sit to Stand: Min assist           General transfer comment: light min A and cues for hand placement to stand from bed and toilet    Ambulation/Gait Ambulation/Gait assistance: Min assist Gait Distance (Feet): 25 Feet (25' then 15') Assistive device: Rolling walker (2 wheels) Gait Pattern/deviations: Step-to pattern;Decreased stride length;Decreased weight shift to left Gait velocity: decrease     General Gait Details: Pt with bil knees flexed requiring cues to straighten as able, also frequent cues for pushing RW and staying close to [employer]    Modified Rankin (Stroke Patients Only)       Balance Overall balance assessment: Needs assistance Sitting-balance support: No upper extremity supported Sitting balance-Leahy Scale: Fair Sitting balance - Comments: Tends to lean posteriorly with dynamic activities     Standing balance-Leahy Scale: Poor Standing balance comment: Requiring RW and/or min Guard  Pertinent Vitals/Pain Pain Assessment: Faces Faces Pain Scale: Hurts little more Pain Location: Reports no pain in L knee; chronic pain in L shoulder/upper back when used/touched Pain Descriptors / Indicators: Discomfort Pain Intervention(s): Limited  activity within patient's tolerance;Monitored during session    Home Living Family/patient expects to be discharged to:: Private residence Living Arrangements: Spouse/significant other Available Help at Discharge: Family;Available 24 hours/day Type of Home: Apartment (condo) Home Access: Stairs to enter   [employer] of Steps: 1 threshold   Home Layout: One level Home Equipment: Shower seat;Cane - single [employer]ista (2 wheels)      Prior Function Prior Level of Function : Independent/Modified Independent             Mobility Comments: Can ambulate in community; Uses a cane when out but not in house ADLs Comments: Doesn't drive due to tremors otherwise independent     Hand Dominance        Extremity/Trunk Assessment   Upper Extremity Assessment Upper Extremity Assessment: Overall WFL for tasks assessed    Lower Extremity Assessment Lower Extremity Assessment: LLE deficits/detail;RLE deficits/detail RLE Deficits / Details: ROM WFL; MMT 5/5 LLE Deficits / Details: ROM: ankle and hip WFL, knee 15 to 90; MMT: ankle 5/5, knee 3/5, hip 3/5 LLE Sensation: WNL    Cervical / Trunk Assessment Cervical / Trunk Assessment: Kyphotic  Communication   Communication: No difficulties  Cognition Arousal/Alertness: Awake/alert Behavior During Therapy: WFL for tasks assessed/performed Overall Cognitive Status: Within Functional Limits for tasks assessed                                          General Comments General comments (skin integrity, edema, etc.): Pt with significant tremors bil UE (baseline from shingles); Pt's urinal with ~1200 mL at arrival and empited, also used toielt and had incontinence when going to sit.  Notified RN.  Pt also with several episodes of emesis - notified RN pt requesting something for nausea    Exercises     Assessment/Plan    PT Assessment Patient needs continued PT services  PT Problem List Decreased  strength;Decreased mobility;Decreased range of motion;Decreased coordination;Decreased knowledge of precautions;Decreased activity tolerance;Decreased balance;Pain       PT Treatment Interventions DME instruction;Therapeutic activities;Modalities;Gait training;Therapeutic exercise;Patient/family education;Stair training;Balance training;Functional mobility training    PT Goals (Current goals can be found in the Care Plan section)  Acute Rehab PT Goals Patient Stated Goal: return home PT Goal Formulation: With patient Time For Goal Achievement: 07/15/21 Potential to Achieve Goals: Good    Frequency 7X/week     Co-evaluation               AM-PAC PT "6 Clicks" Mobility  Outcome Measure Help needed turning from your back to your side while in a flat bed without using bedrails?: A Little Help needed moving from lying on your back to sitting on the side of a flat bed without using bedrails?: A Little Help needed moving to and from a bed to a chair (including a wheelchair)?: A Little Help needed standing up from a chair using your arms (e.g., wheelchair or bedside chair)?: A Little Help needed to walk in hospital room?: A Little Help needed climbing 3-5 steps with a railing? : A Lot 6 Click Score: 17    End of Session Equipment Utilized During Treatment: Gait belt Activity Tolerance: Other (comment) (Limited due  to nausea/vomitting) Patient left: in bed;with call bell/phone within reach;with bed alarm set;with SCD's reapplied Nurse Communication: Mobility status PT Visit Diagnosis: Other abnormalities of gait and mobility (R26.89);Unsteadiness on feet (R26.81);Muscle weakness (generalized) (M62.81)    Time: 2103-1281 PT Time Calculation (min) (ACUTE ONLY): 42 min   Charges:   PT Evaluation $PT Eval Low Complexity: 1 Low PT Treatments $Gait Training: 8-22 mins $Therapeutic Activity: 8-22 mins        Abran Richard, PT Acute Rehab Services Pager [phone] [facility]  [place] 07/01/2021, 6:46 PM

## 2021-07-01 NOTE — Anesthesia Postprocedure Evaluation (Signed)
Anesthesia Post Note  Patient: [name]  Procedure(s) Performed: TOTAL KNEE ARTHROPLASTY (Left: Knee)     Patient location during evaluation: PACU Anesthesia Type: General Level of consciousness: awake and alert Pain management: pain level controlled Vital Signs Assessment: post-procedure vital signs reviewed and stable Respiratory status: spontaneous breathing, nonlabored ventilation, respiratory function stable and patient connected to nasal cannula oxygen Cardiovascular status: blood pressure returned to baseline and stable Postop Assessment: no apparent nausea or vomiting Anesthetic complications: no   No notable events documented.  Last Vitals:  Vitals:   07/01/21 1245 07/01/21 1300  BP: (!) 182/80 (!) 182/87  Pulse: 71 72  Resp: (!) 9 15  Temp:  (!) 36.4 C  SpO2: 100% 99%    Last Pain:  Vitals:   07/01/21 1300  TempSrc:   PainSc: 0-No pain                 Herron Fero S

## 2021-07-01 NOTE — Interval H&P Note (Signed)
History and Physical Interval Note:  07/01/2021 9:00 AM  Stephen Nunez  has presented today for surgery, with the diagnosis of Left knee osteoarthritis.  The various methods of treatment have been discussed with the patient and family. After consideration of risks, benefits and other options for treatment, the patient has consented to  Procedure(s): TOTAL KNEE ARTHROPLASTY (Left) as a surgical intervention.  The patient's history has been reviewed, patient examined, no change in status, stable for surgery.  I have reviewed the patient's chart and labs.  Questions were answered to the patient's satisfaction.     Mauri Pole

## 2021-07-01 NOTE — Op Note (Signed)
NAME:  [place] RECORD NO.:  [id number]                             FACILITY:  [facility]      PHYSICIAN:  Pietro Cassis. Alvan Dame, M.D.  DATE OF BIRTH:  [birth year]      DATE OF PROCEDURE:  07/01/2021                                     OPERATIVE REPORT         PREOPERATIVE DIAGNOSIS:  Left knee osteoarthritis.      POSTOPERATIVE DIAGNOSIS:  Left knee osteoarthritis.      FINDINGS:  The patient was noted to have complete loss of cartilage and   bone-on-bone arthritis with associated osteophytes in the medial and patellofemoral compartments of   the knee with a significant synovitis and associated effusion.  The patient had failed months of conservative treatment including medications, injection therapy, activity modification.     PROCEDURE:  Left total knee replacement.      COMPONENTS USED:  DePuy Attune rotating platform posterior stabilized knee   system, a size 7 femur, 7 tibia, size 10 mm PS AOX insert, and 41 anatomic patellar   button.      SURGEON:  Pietro Cassis. Alvan Dame, M.D.      ASSISTANT:  Costella Hatcher, PA-C.      ANESTHESIA:  Regional and Spinal.      SPECIMENS:  None.      COMPLICATION:  None.      DRAINS:  None.  EBL: <150 cc      TOURNIQUET TIME:  29 min at 225 mmHg     The patient was stable to the recovery room.      INDICATION FOR PROCEDURE:  Stephen Nunez is a 88 y.o. male patient of   mine.  The patient had been seen, evaluated, and treated for months conservatively in the   office with medication, activity modification, and injections.  The patient had   radiographic changes of bone-on-bone arthritis with endplate sclerosis and osteophytes noted.  Based on the radiographic changes and failed conservative measures, the patient   decided to proceed with definitive treatment, total knee replacement.  Risks of infection, DVT, component failure, need for revision surgery, neurovascular injury were reviewed in the office setting.   The postop course was reviewed stressing the efforts to maximize post-operative satisfaction and function.  Consent was obtained for benefit of pain   relief.      PROCEDURE IN DETAIL:  The patient was brought to the operative theater.   Once adequate anesthesia, preoperative antibiotics, 2 gm of Ancef,1 gm of Tranexamic Acid, and 10 mg of Decadron administered, the patient was positioned supine with a left thigh tourniquet placed.  The  left lower extremity was prepped and draped in sterile fashion.  A time-   out was performed identifying the patient, planned procedure, and the appropriate extremity.      The left lower extremity was placed in the [facility] leg holder.  The leg was   exsanguinated, tourniquet elevated to 250 mmHg.  A midline incision was   made followed by median parapatellar arthrotomy.  Following initial   exposure, attention was  first directed to the patella.  Precut   measurement was noted to be 26 mm.  I resected down to 14 mm and used a   41 anatomic patellar button to restore patellar height as well as cover the cut surface.      The lug holes were drilled and a metal shim was placed to protect the   patella from retractors and saw blade during the procedure.      At this point, attention was now directed to the femur.  The femoral   canal was opened with a drill, irrigated to try to prevent fat emboli.  An   intramedullary rod was passed at 5 degrees valgus, 11 mm of bone was   resected off the distal femur.  Following this resection, the tibia was   subluxated anteriorly.  Using the extramedullary guide, 2 mm of bone was resected off   the proximal medial tibia.  We confirmed the gap would be   stable medially and laterally with a size 5 spacer block as well as confirmed that the tibial cut was perpendicular in the coronal plane, checking with an alignment rod.      Once this was done, I sized the femur to be a size 7 in the anterior-   posterior dimension, chose a  standard component based on medial and   lateral dimension.  The size 7 rotation block was then pinned in   position anterior referenced using the C-clamp to set rotation.  The   anterior, posterior, and  chamfer cuts were made without difficulty nor   notching making certain that I was along the anterior cortex to help   with flexion gap stability.      The final box cut was made off the lateral aspect of distal femur.      At this point, the tibia was sized to be a size 7.  The size 7 tray was   then pinned in position through the medial third of the tubercle,   drilled, and keel punched.  Trial reduction was now carried with a 7 femur,  7 tibia, a size 10 mm PS insert, and the 41 anatomic patella botton.  The knee was brought to full extension with good flexion stability with the patella   tracking through the trochlea without application of pressure.  Given   all these findings the trial components removed.  Final components were   opened and cement was mixed.  The knee was irrigated with normal saline solution and pulse lavage.  The synovial lining was   then injected with 30 cc of 0.25% Marcaine with epinephrine, 1 cc of Toradol and 30 cc of NS for a total of 61 cc.     Final implants were then cemented onto cleaned and dried cut surfaces of bone with the knee brought to extension with a size 10 mm PS trial insert.      Once the cement had fully cured, excess cement was removed   throughout the knee.  I confirmed that I was satisfied with the range of   motion and stability, and the final size 10 mm PS AOX insert was chosen.  It was   placed into the knee.      The tourniquet had been let down at 29 minutes.  No significant   hemostasis was required.  The extensor mechanism was then reapproximated using #1 Vicryl and #1 Stratafix sutures with the knee   in flexion.  The  remaining wound was closed with 2-0 Vicryl and running 4-0 Monocryl.   The knee was cleaned, dried, dressed  sterilely using Dermabond and   Aquacel dressing.  The patient was then   brought to recovery room in stable condition, tolerating the procedure   well.   Please note that Physician Assistant, Costella Hatcher, PA-C was present for the entirety of the case, and was utilized for pre-operative positioning, peri-operative retractor management, general facilitation of the procedure and for primary wound closure at the end of the case.              Pietro Cassis Alvan Dame, M.D.    07/01/2021 10:33 AM

## 2021-07-01 NOTE — Discharge Instructions (Signed)

## 2021-07-01 NOTE — Progress Notes (Signed)
Assisted Dr. Rose with left, ultrasound guided, adductor canal block. Side rails up, monitors on throughout procedure. See vital signs in flow sheet. Tolerated Procedure well.  

## 2021-07-01 NOTE — Transfer of Care (Signed)
Immediate Anesthesia Transfer of Care Note  Patient: Stephen Nunez  Procedure(s) Performed: Procedure(s): TOTAL KNEE ARTHROPLASTY (Left)  Patient Location: PACU  Anesthesia Type:General  Level of Consciousness:  sedated, patient cooperative and responds to stimulation  Airway & Oxygen Therapy:Patient Spontanous Breathing and Patient connected to face mask oxgen  Post-op Assessment:  Report given to PACU RN and Post -op Vital signs reviewed and stable  Post vital signs:  Reviewed and stable  Last Vitals:  Vitals:   07/01/21 0945 07/01/21 0947  BP:  (!) 175/79  Pulse:  (!) 57  Resp:  19  Temp:    SpO2: 104% 045%    Complications: No apparent anesthesia complications

## 2021-07-01 NOTE — Anesthesia Procedure Notes (Signed)
Procedure Name: LMA Insertion Date/Time: 07/01/2021 11:00 AM Performed by: Lavina Hamman, CRNA Pre-anesthesia Checklist: Patient identified, Emergency Drugs available, Suction available and Patient being monitored Patient Re-evaluated:Patient Re-evaluated prior to induction Oxygen Delivery Method: Circle System Utilized Preoxygenation: Pre-oxygenation with 100% oxygen Induction Type: IV induction Ventilation: Mask ventilation without difficulty LMA: LMA inserted LMA Size: 4.0 Number of attempts: 1 Airway Equipment and Method: Bite block Placement Confirmation: positive ETCO2 Tube secured with: Tape Dental Injury: Teeth and Oropharynx as per pre-operative assessment

## 2021-07-01 NOTE — Anesthesia Procedure Notes (Signed)
Anesthesia Procedure Image    

## 2021-07-01 NOTE — Care Plan (Addendum)
Ortho Bundle Case Management Note  Patient Details  Name: Stephen Nunez MRN: [id number] Date of Birth: Jul 03, 1931                  L TKA on 07-01-21 DCP: Home with significant other, [name] DME: No needs, has a RW PT: EO on 07-04-21.   DME Arranged:  N/A DME Agency:     HH Arranged:    HH Agency:     Additional Comments: Please contact me with any questions of if this plan should need to change.  Marianne Sofia, RN,CCM [facility]  [phone] 07/01/2021, 4:42 PM

## 2021-07-01 NOTE — Plan of Care (Signed)
POC initiated 

## 2021-07-02 ENCOUNTER — Encounter (HOSPITAL_COMMUNITY): Payer: Self-pay | Admitting: Orthopedic Surgery

## 2021-07-02 DIAGNOSIS — M1712 Unilateral primary osteoarthritis, left knee: Secondary | ICD-10-CM | POA: Diagnosis not present

## 2021-07-02 LAB — BASIC METABOLIC PANEL
Anion gap: 9 (ref 5–15)
BUN: 27 mg/dL — ABNORMAL HIGH (ref 8–23)
CO2: 28 mmol/L (ref 22–32)
Calcium: 8.7 mg/dL — ABNORMAL LOW (ref 8.9–10.3)
Chloride: 101 mmol/L (ref 98–111)
Creatinine, Ser: 1.17 mg/dL (ref 0.61–1.24)
GFR, Estimated: >60 mL/min (ref >60)
Glucose, Bld: 140 mg/dL — ABNORMAL HIGH (ref 70–99)
Potassium: 4.6 mmol/L (ref 3.5–5.1)
Sodium: 138 mmol/L (ref 135–145)

## 2021-07-02 LAB — CBC
HCT: 29.9 % — ABNORMAL LOW (ref 39.0–52.0)
Hemoglobin: 9.8 g/dL — ABNORMAL LOW (ref 13.0–17.0)
MCH: 31.3 pg (ref 26.0–34.0)
MCHC: 32.8 g/dL (ref 30.0–36.0)
MCV: 95.5 fL (ref 80.0–100.0)
Platelets: 175 10*3/uL (ref 150–400)
RBC: 3.13 MIL/uL — ABNORMAL LOW (ref 4.22–5.81)
RDW: 13 % (ref 11.5–15.5)
WBC: 8.7 10*3/uL (ref 4.0–10.5)
nRBC: 0 % (ref 0.0–0.2)

## 2021-07-02 MED ORDER — LISINOPRIL 20 MG PO TABS
40.0000 mg | ORAL_TABLET | Freq: Every day | ORAL | Status: DC
  Administered 2021-07-02 – 2021-07-03 (×2): 40 mg via ORAL
  Filled 2021-07-02 (×2): qty 2

## 2021-07-02 NOTE — Progress Notes (Signed)
Subjective: 1 Day Post-Op Procedure(s) (LRB): TOTAL KNEE ARTHROPLASTY (Left) Patient reports pain as mild.   Patient seen in rounds for Dr. Alvan Dame. Patient is well, and has had no acute complaints or problems. No acute events overnight. Foley catheter removed. Patient ambulated 25 feet with PT. His wife is at the bedside this morning. We will start therapy today.   Objective: Vital signs in last 24 hours: Temp:  [97.5 F (36.4 C)-98.5 F (36.9 C)] 98.4 F (36.9 C) (01/04 1010) Pulse Rate:  [67-83] 77 (01/04 1010) Resp:  [9-18] 17 (01/04 1010) BP: (113-197)/(57-89) 119/58 (01/04 1010) SpO2:  [96 %-100 %] 96 % (01/04 1010) FiO2 (%):  [28 %] 28 % (01/03 1938)  Intake/Output from previous day:  Intake/Output Summary (Last 24 hours) at 07/02/2021 1243 Last data filed at 07/02/2021 0958 Gross per 24 hour  Intake 868.59 ml  Output 2500 ml  Net -1631.41 ml     Intake/Output this shift: Total I/O In: 150 [P.O.:150] Out: -   Labs: Recent Labs    07/02/21 0321  HGB 9.8*   Recent Labs    07/02/21 0321  WBC 8.7  RBC 3.13*  HCT 29.9*  PLT 175   Recent Labs    07/02/21 0321  NA 138  K 4.6  CL 101  CO2 28  BUN 27*  CREATININE 1.17  GLUCOSE 140*  CALCIUM 8.7*   No results for input(s): LABPT, INR in the last 72 hours.  Exam: General - Patient is Alert and Oriented Extremity - Neurologically intact Sensation intact distally Intact pulses distally Dorsiflexion/Plantar flexion intact Dressing - dressing C/D/I Motor Function - intact, moving foot and toes well on exam.   Past Medical History:  Diagnosis Date   Aortic stenosis    Arthritis    Bell's palsy    mild, left   Heart murmur    History of shingles    2000   Hyperlipidemia    Hypertension    OSA (obstructive sleep apnea)    Pneumonia    as an infant   Post herpetic neuralgia    Prostate cancer    prostate cancer   Tremor     Assessment/Plan: 1 Day Post-Op Procedure(s) (LRB): TOTAL KNEE  ARTHROPLASTY (Left) Principal Problem:   S/P total knee arthroplasty, left  Estimated body mass index is 23.76 kg/m as calculated from the following:   Height as of this encounter: 5' 10.5" (1.791 m).   Weight as of this encounter: 76.2 kg. Advance diet Up with therapy   Patient's anticipated LOS is less than 2 midnights, meeting these requirements: - Younger than 69 - Lives within 1 hour of care - Has a competent adult at home to recover with post-op recover - NO history of  - Chronic pain requiring opiods  - Diabetes  - Coronary Artery Disease  - Heart failure  - Heart attack  - Stroke  - DVT/VTE  - Cardiac arrhythmia  - Respiratory Failure/COPD  - Renal failure  - Anemia  - Advanced Liver disease     DVT Prophylaxis - Aspirin Weight bearing as tolerated.  Plan is to go Home after hospital stay.   Hgb stable at 9.8 this AM  Based on patient's age and mobility/balance concerns we discussed that he may require an additional night stay in order to continue to progress with therapy.  He will get up today for 2 sessions, and we can decide based on his progress at that point. I would much rather  him take one extra day to progress than be at risk for fall at home.   Griffith Citron, PA-C Orthopedic Surgery [phone] 07/02/2021, 12:43 PM

## 2021-07-02 NOTE — Progress Notes (Signed)
Physical Therapy Treatment Patient Details Name: Stephen Nunez MRN: [id number] DOB: 01-17-1932 Today's Date: 07/02/2021   History of Present Illness Pt is 88 yo male s/p L TKA on 07/01/20.  Pt with hx including prostate CA, HCL, chronic pain, shingles resulting in tremors, L Bell's Palsy, HTN    PT Comments    Pt tolerated increased ambulation distance of 100' today however is requiring frequent verbal cues for safe positioning in RW, posture, and sequencing. He is mildly unsteady and requires min assist when walking. He would benefit from one more night in the hospital.    Recommendations for follow up therapy are one component of a multi-disciplinary discharge planning process, led by the attending physician.  Recommendations may be updated based on patient status, additional functional criteria and insurance authorization.  Follow Up Recommendations  Follow physician's recommendations for discharge plan and follow up therapies     Assistance Recommended at Discharge Frequent or constant Supervision/Assistance  Patient can return home with the following A little help with walking and/or transfers;A little help with bathing/dressing/bathroom;Assistance with cooking/housework;Assist for transportation;Help with stairs or ramp for entrance   Equipment Recommendations  None recommended by PT    Recommendations for Other Services       Precautions / Restrictions Precautions Precautions: Fall;Knee Precaution Booklet Issued: Yes (comment) Precaution Comments: reviewed no pillow under knee Restrictions Weight Bearing Restrictions: No LLE Weight Bearing: Weight bearing as tolerated     Mobility  Bed Mobility Overal bed mobility: Needs Assistance Bed Mobility: Supine to Sit     Supine to sit: Min assist     General bed mobility comments: min A to pivot hips to EOB    Transfers Overall transfer level: Needs assistance Equipment used: Rolling walker (2 wheels) Transfers: Sit  to/from Stand Sit to Stand: Min guard           General transfer comment: light min guard and cues for hand placement to stand    Ambulation/Gait Ambulation/Gait assistance: Min guard Gait Distance (Feet): 90 Feet Assistive device: Rolling walker (2 wheels) Gait Pattern/deviations: Step-to pattern;Decreased stride length;Decreased weight shift to left Gait velocity: decreased     General Gait Details: continuous VCs for positioning in RW and for sequencing and step length, tends to not step close enough to RW, VCs to lift head, mildly unsteady   Stairs             Wheelchair Mobility    Modified Rankin (Stroke Patients Only)       Balance Overall balance assessment: Needs assistance Sitting-balance support: No upper extremity supported Sitting balance-Leahy Scale: Fair Sitting balance - Comments: Tends to lean posteriorly with dynamic activities     Standing balance-Leahy Scale: Poor Standing balance comment: Requiring RW and/or min Guard                            Cognition Arousal/Alertness: Awake/alert Behavior During Therapy: WFL for tasks assessed/performed Overall Cognitive Status: Within Functional Limits for tasks assessed                                          Exercises Total Joint Exercises Ankle Circles/Pumps: AROM;Both;10 reps;Supine Quad Sets: AROM;Left;5 reps;Supine Long Arc Quad: AAROM;Left;5 reps;Supine    General Comments        Pertinent Vitals/Pain Pain Score: 1  Pain Location: L knee;  pt also reports chronic pain in L shoulder/upper back when used/touched 2* shingles Pain Descriptors / Indicators: Discomfort Pain Intervention(s): Limited activity within patient's tolerance;Monitored during session;Premedicated before session;Ice applied    Home Living                          Prior Function            PT Goals (current goals can now be found in the care plan section) Acute Rehab  PT Goals Patient Stated Goal: return home, likes to read PT Goal Formulation: With patient/family Time For Goal Achievement: 07/15/21 Potential to Achieve Goals: Good Progress towards PT goals: Progressing toward goals    Frequency    7X/week      PT Plan Current plan remains appropriate    Co-evaluation              AM-PAC PT "6 Clicks" Mobility   Outcome Measure  Help needed turning from your back to your side while in a flat bed without using bedrails?: A Little Help needed moving from lying on your back to sitting on the side of a flat bed without using bedrails?: A Little Help needed moving to and from a bed to a chair (including a wheelchair)?: A Little Help needed standing up from a chair using your arms (e.g., wheelchair or bedside chair)?: A Little Help needed to walk in hospital room?: A Little Help needed climbing 3-5 steps with a railing? : A Lot 6 Click Score: 17    End of Session Equipment Utilized During Treatment: Gait belt Activity Tolerance: Patient tolerated treatment well Patient left: with call bell/phone within reach;in chair;with chair alarm set Nurse Communication: Mobility status PT Visit Diagnosis: Other abnormalities of gait and mobility (R26.89);Unsteadiness on feet (R26.81);Muscle weakness (generalized) (M62.81)     Time: 5038-8828 PT Time Calculation (min) (ACUTE ONLY): 30 min  Charges:  $Gait Training: 8-22 mins $Therapeutic Exercise: 8-22 mins                    Blondell Reveal Kistler PT 07/02/2021  Acute Rehabilitation Services Pager [phone] Office [phone]

## 2021-07-02 NOTE — Plan of Care (Signed)

## 2021-07-02 NOTE — Progress Notes (Signed)
Physical Therapy Treatment Patient Details Name: Stephen Nunez MRN: [id number] DOB: 08-19-1931 Today's Date: 07/02/2021   History of Present Illness Pt is 88 yo male s/p L TKA on 07/01/20.  Pt with hx including prostate CA, HCL, chronic pain, shingles resulting in tremors, L Bell's Palsy, HTN    PT Comments    Pt tolerated increased ambulation distance of 140' with RW, improved sequencing and positioning in RW but does still require some VCs for safety and posture. Pt/spouse were instructed in TKA HEP and in precautions (no pillow under knee).     Recommendations for follow up therapy are one component of a multi-disciplinary discharge planning process, led by the attending physician.  Recommendations may be updated based on patient status, additional functional criteria and insurance authorization.  Follow Up Recommendations  Follow physician's recommendations for discharge plan and follow up therapies     Assistance Recommended at Discharge Frequent or constant Supervision/Assistance  Patient can return home with the following A little help with walking and/or transfers;A little help with bathing/dressing/bathroom;Assistance with cooking/housework;Assist for transportation;Help with stairs or ramp for entrance   Equipment Recommendations  None recommended by PT    Recommendations for Other Services       Precautions / Restrictions Precautions Precautions: Fall;Knee Precaution Booklet Issued: Yes (comment) Precaution Comments: reviewed no pillow under knee Restrictions Weight Bearing Restrictions: No LLE Weight Bearing: Weight bearing as tolerated     Mobility  Bed Mobility Overal bed mobility: Modified Independent Bed Mobility: Supine to Sit     Supine to sit: Modified independent (Device/Increase time)     General bed mobility comments: increased time, used bedrail to pull up    Transfers Overall transfer level: Needs assistance Equipment used: Rolling walker (2  wheels) Transfers: Sit to/from Stand Sit to Stand: Min guard           General transfer comment: light min guard and cues for hand placement to stand    Ambulation/Gait Ambulation/Gait assistance: Min guard Gait Distance (Feet): 140 Feet Assistive device: Rolling walker (2 wheels) Gait Pattern/deviations: Step-to pattern;Decreased stride length;Decreased weight shift to left Gait velocity: decreased     General Gait Details: VCs for positioning, improved safety awareness, VCs to lift head, steadier this afternoon than this morning   Stairs             Wheelchair Mobility    Modified Rankin (Stroke Patients Only)       Balance Overall balance assessment: Needs assistance Sitting-balance support: No upper extremity supported Sitting balance-Leahy Scale: Fair Sitting balance - Comments: Tends to lean posteriorly with dynamic activities     Standing balance-Leahy Scale: Poor Standing balance comment: Requiring RW and/or min Guard                            Cognition Arousal/Alertness: Awake/alert Behavior During Therapy: WFL for tasks assessed/performed Overall Cognitive Status: Within Functional Limits for tasks assessed                                          Exercises Total Joint Exercises Ankle Circles/Pumps: AROM;Both;10 reps;Supine Quad Sets: AROM;Left;5 reps;Supine Short Arc Quad: AROM;Left;5 reps;Supine Heel Slides: AAROM;Left;10 reps;Supine Straight Leg Raises: AAROM;Left;10 reps;Supine Long Arc Quad: AAROM;Left;5 reps;Supine    General Comments General comments (skin integrity, edema, etc.): BUE tremors at baseline  Pertinent Vitals/Pain Pain Score: 1  Pain Location: L knee; pt also reports chronic pain in L shoulder/upper back when used/touched 2* shingles Pain Descriptors / Indicators: Discomfort Pain Intervention(s): Limited activity within patient's tolerance;Monitored during session;Premedicated before  session;Repositioned;Ice applied    Home Living                          Prior Function            PT Goals (current goals can now be found in the care plan section) Acute Rehab PT Goals Patient Stated Goal: return home, likes to read PT Goal Formulation: With patient/family Time For Goal Achievement: 07/15/21 Potential to Achieve Goals: Good Progress towards PT goals: Progressing toward goals    Frequency    7X/week      PT Plan Current plan remains appropriate    Co-evaluation              AM-PAC PT "6 Clicks" Mobility   Outcome Measure  Help needed turning from your back to your side while in a flat bed without using bedrails?: A Little Help needed moving from lying on your back to sitting on the side of a flat bed without using bedrails?: A Little Help needed moving to and from a bed to a chair (including a wheelchair)?: A Little Help needed standing up from a chair using your arms (e.g., wheelchair or bedside chair)?: A Little Help needed to walk in hospital room?: A Little Help needed climbing 3-5 steps with a railing? : A Lot 6 Click Score: 17    End of Session Equipment Utilized During Treatment: Gait belt Activity Tolerance: Patient tolerated treatment well Patient left: with call bell/phone within reach;in bed;with bed alarm set;with family/visitor present Nurse Communication: Mobility status PT Visit Diagnosis: Other abnormalities of gait and mobility (R26.89);Unsteadiness on feet (R26.81);Muscle weakness (generalized) (M62.81)     Time: 3419-3790 PT Time Calculation (min) (ACUTE ONLY): 27 min  Charges:  $Gait Training: 8-22 mins $Therapeutic Exercise: 8-22 mins                    Blondell Reveal Kistler PT 07/02/2021  Acute Rehabilitation Services Pager [phone] Office [phone]

## 2021-07-02 NOTE — TOC Transition Note (Signed)
Transition of Care [facility]) - CM/SW Discharge Note   Patient Details  Name: Stephen Nunez MRN: [id number] Date of Birth: 1932-06-07  [facility] CM/SW Contact:  Lennart Pall, LCSW Phone Number: 07/02/2021, 12:29 PM   Clinical Narrative:     Met with pt and spouse and confirming he has all needed DME at home.  Plan for OPPT at [facility].  No further TOC needs.  Final next level of care: OP Rehab Barriers to Discharge: No Barriers Identified   Patient Goals and CMS Choice Patient states their goals for this hospitalization and ongoing recovery are:: return home      Discharge Placement                       Discharge Plan and Services                DME Arranged: N/A                    Social Determinants of Health (SDOH) Interventions     Readmission Risk Interventions No flowsheet data found.

## 2021-07-03 ENCOUNTER — Telehealth: Payer: Self-pay | Admitting: Internal Medicine

## 2021-07-03 DIAGNOSIS — M1712 Unilateral primary osteoarthritis, left knee: Secondary | ICD-10-CM | POA: Diagnosis not present

## 2021-07-03 LAB — CBC
HCT: 27.9 % — ABNORMAL LOW (ref 39.0–52.0)
Hemoglobin: 9.3 g/dL — ABNORMAL LOW (ref 13.0–17.0)
MCH: 32.1 pg (ref 26.0–34.0)
MCHC: 33.3 g/dL (ref 30.0–36.0)
MCV: 96.2 fL (ref 80.0–100.0)
Platelets: 165 10*3/uL (ref 150–400)
RBC: 2.9 MIL/uL — ABNORMAL LOW (ref 4.22–5.81)
RDW: 13.5 % (ref 11.5–15.5)
WBC: 9.8 10*3/uL (ref 4.0–10.5)
nRBC: 0 % (ref 0.0–0.2)

## 2021-07-03 MED ORDER — POLYETHYLENE GLYCOL 3350 17 G PO PACK: 17.0000 g | PACK | Freq: Every day | ORAL | 0 refills | Status: AC | PRN

## 2021-07-03 MED ORDER — HYDROCODONE-ACETAMINOPHEN 5-325 MG PO TABS: 1.0000 | ORAL_TABLET | ORAL | 0 refills | Status: DC | PRN

## 2021-07-03 MED ORDER — METHOCARBAMOL 500 MG PO TABS: 500.0000 mg | ORAL_TABLET | Freq: Four times a day (QID) | ORAL | 0 refills | Status: DC | PRN

## 2021-07-03 MED ORDER — DOCUSATE SODIUM 100 MG PO CAPS: 100.0000 mg | ORAL_CAPSULE | Freq: Two times a day (BID) | ORAL | 0 refills | Status: DC

## 2021-07-03 MED ORDER — ASPIRIN 81 MG PO CHEW: 81.0000 mg | CHEWABLE_TABLET | Freq: Two times a day (BID) | ORAL | 0 refills | Status: AC

## 2021-07-03 NOTE — Telephone Encounter (Signed)
Spoke with wife, Bethena Roys and made her aware of results.  Bethena Roys was appreciative for call.

## 2021-07-03 NOTE — Progress Notes (Signed)
Physical Therapy Treatment Patient Details Name: Stephen Nunez MRN: [id number] DOB: 04-Jul-1931 Today's Date: 07/03/2021   History of Present Illness Pt is 88 yo male s/p L TKA on 07/01/20.  Pt with hx including prostate CA, HCL, chronic pain, shingles resulting in tremors, L Bell's Palsy, HTN    PT Comments    Pt is progressing well with mobility. Stair training completed today. Pt/wife demonstrate good understanding of HEP. He ambulated 100' + 102' with RW. From a PT standpoint, he is ready to DC home.     Recommendations for follow up therapy are one component of a multi-disciplinary discharge planning process, led by the attending physician.  Recommendations may be updated based on patient status, additional functional criteria and insurance authorization.  Follow Up Recommendations  Follow physician's recommendations for discharge plan and follow up therapies     Assistance Recommended at Discharge Intermittent Supervision/Assistance  Patient can return home with the following A little help with walking and/or transfers;A little help with bathing/dressing/bathroom;Assistance with cooking/housework;Assist for transportation;Help with stairs or ramp for entrance   Equipment Recommendations  None recommended by PT    Recommendations for Other Services       Precautions / Restrictions Precautions Precautions: Fall;Knee Precaution Booklet Issued: Yes (comment) Precaution Comments: reviewed no pillow under knee Restrictions Weight Bearing Restrictions: No LLE Weight Bearing: Weight bearing as tolerated     Mobility  Bed Mobility Overal bed mobility: Modified Independent Bed Mobility: Supine to Sit     Supine to sit: Modified independent (Device/Increase time)     General bed mobility comments: increased time, used bedrail to pull up    Transfers Overall transfer level: Needs assistance Equipment used: Rolling walker (2 wheels) Transfers: Sit to/from Stand Sit to  Stand: Supervision           General transfer comment: supervision for safety, good hand placement without cues    Ambulation/Gait Ambulation/Gait assistance: Supervision Gait Distance (Feet): 160 Feet (100' + 17' with seated rest) Assistive device: Rolling walker (2 wheels) Gait Pattern/deviations: Step-to pattern;Decreased stride length;Decreased weight shift to left Gait velocity: decreased     General Gait Details: good positioning in RW, VCs to lift head, steady, no loss of balance   Stairs Stairs: Yes Stairs assistance: Min assist Stair Management: No rails;Backwards;Step to pattern;With walker Number of Stairs: 1 General stair comments: 1 step backwards wtih RW x 3 trials, wife present, VCs for sequencing   Wheelchair Mobility    Modified Rankin (Stroke Patients Only)       Balance Overall balance assessment: Needs assistance Sitting-balance support: No upper extremity supported Sitting balance-Leahy Scale: Good       Standing balance-Leahy Scale: Fair Standing balance comment: Requiring RW and/or min Guard                            Cognition Arousal/Alertness: Awake/alert Behavior During Therapy: WFL for tasks assessed/performed Overall Cognitive Status: Within Functional Limits for tasks assessed                                          Exercises Total Joint Exercises Ankle Circles/Pumps: AROM;Both;10 reps;Supine Quad Sets: AROM;Left;5 reps;Supine Short Arc Quad: AROM;Left;5 reps;Supine Heel Slides: AAROM;Left;10 reps;Supine Hip ABduction/ADduction: AROM;Left;10 reps;Supine Straight Leg Raises: AAROM;Left;10 reps;Supine Long Arc Quad: AAROM;Left;5 reps;Seated Knee Flexion: AAROM;Left;10 reps;Seated Goniometric ROM: 5-75* AAROM L  knee    General Comments        Pertinent Vitals/Pain Pain Score: 3  Pain Location: L knee; pt also reports chronic pain in L shoulder/upper back when used/touched 2* shingles Pain  Descriptors / Indicators: Discomfort Pain Intervention(s): Limited activity within patient's tolerance;Monitored during session;Premedicated before session;Ice applied    Home Living                          Prior Function            PT Goals (current goals can now be found in the care plan section) Acute Rehab PT Goals Patient Stated Goal: return home, likes to read PT Goal Formulation: With patient/family Time For Goal Achievement: 07/15/21 Potential to Achieve Goals: Good Progress towards PT goals: Progressing toward goals    Frequency    7X/week      PT Plan Current plan remains appropriate    Co-evaluation              AM-PAC PT "6 Clicks" Mobility   Outcome Measure  Help needed turning from your back to your side while in a flat bed without using bedrails?: A Little Help needed moving from lying on your back to sitting on the side of a flat bed without using bedrails?: A Little Help needed moving to and from a bed to a chair (including a wheelchair)?: None Help needed standing up from a chair using your arms (e.g., wheelchair or bedside chair)?: None Help needed to walk in hospital room?: None Help needed climbing 3-5 steps with a railing? : A Little 6 Click Score: 21    End of Session Equipment Utilized During Treatment: Gait belt Activity Tolerance: Patient tolerated treatment well Patient left: with call bell/phone within reach;with family/visitor present;in chair;with chair alarm set Nurse Communication: Mobility status PT Visit Diagnosis: Other abnormalities of gait and mobility (R26.89);Unsteadiness on feet (R26.81);Muscle weakness (generalized) (M62.81)     Time: 3734-2876 PT Time Calculation (min) (ACUTE ONLY): 44 min  Charges:  $Gait Training: 8-22 mins $Therapeutic Exercise: 8-22 mins $Therapeutic Activity: 8-22 mins                     Blondell Reveal Kistler PT 07/03/2021  Acute Rehabilitation Services Pager  [phone] Office [phone]

## 2021-07-03 NOTE — Telephone Encounter (Signed)
Wife was returning call for results for patient. Please advise

## 2021-07-03 NOTE — Progress Notes (Signed)
Subjective: 2 Days Post-Op Procedure(s) (LRB): TOTAL KNEE ARTHROPLASTY (Left) Patient reports pain as mild.   Patient seen in rounds for Dr. Alvan Dame. Patient is well, and has had no acute complaints or problems. No acute events overnight. Voiding without difficulty. He reports he had a small BM. Ambulated 150+ feet yesterday with PT.  We will continue therapy today.   Objective: Vital signs in last 24 hours: Temp:  [97.9 F (36.6 C)-98.8 F (37.1 C)] 97.9 F (36.6 C) (01/05 0605) Pulse Rate:  [63-83] 63 (01/05 0605) Resp:  [16-17] 16 (01/05 0605) BP: (118-156)/(57-71) 156/67 (01/05 0605) SpO2:  [94 %-98 %] 95 % (01/05 0605)  Intake/Output from previous day:  Intake/Output Summary (Last 24 hours) at 07/03/2021 0815 Last data filed at 07/03/2021 0600 Gross per 24 hour  Intake 1230 ml  Output 200 ml  Net 1030 ml     Intake/Output this shift: No intake/output data recorded.  Labs: Recent Labs    07/02/21 0321 07/03/21 0317  HGB 9.8* 9.3*   Recent Labs    07/02/21 0321 07/03/21 0317  WBC 8.7 9.8  RBC 3.13* 2.90*  HCT 29.9* 27.9*  PLT 175 165   Recent Labs    07/02/21 0321  NA 138  K 4.6  CL 101  CO2 28  BUN 27*  CREATININE 1.17  GLUCOSE 140*  CALCIUM 8.7*   No results for input(s): LABPT, INR in the last 72 hours.  Exam: General - Patient is Alert and Oriented Extremity - Neurologically intact Sensation intact distally Intact pulses distally Dorsiflexion/Plantar flexion intact Dressing - dressing C/D/I Motor Function - intact, moving foot and toes well on exam.   Past Medical History:  Diagnosis Date   Aortic stenosis    Arthritis    Bell's palsy    mild, left   Heart murmur    History of shingles    2000   Hyperlipidemia    Hypertension    OSA (obstructive sleep apnea)    Pneumonia    as an infant   Post herpetic neuralgia    Prostate cancer    prostate cancer   Tremor     Assessment/Plan: 2 Days Post-Op Procedure(s) (LRB): TOTAL  KNEE ARTHROPLASTY (Left) Principal Problem:   S/P total knee arthroplasty, left  Estimated body mass index is 23.76 kg/m as calculated from the following:   Height as of this encounter: 5' 10.5" (1.791 m).   Weight as of this encounter: 76.2 kg. Advance diet Up with therapy D/C IV fluids   Patient's anticipated LOS is less than 2 midnights, meeting these requirements: - Younger than 43 - Lives within 1 hour of care - Has a competent adult at home to recover with post-op recover - NO history of  - Chronic pain requiring opiods  - Diabetes  - Coronary Artery Disease  - Heart failure  - Heart attack  - Stroke  - DVT/VTE  - Cardiac arrhythmia  - Respiratory Failure/COPD  - Renal failure  - Anemia  - Advanced Liver disease     DVT Prophylaxis - Aspirin Weight bearing as tolerated.  Hgb stable at 9.3 this AM.   CKD at baseline, Cr. Stable at 1.17.   Plan is to go Home after hospital stay. Plan for discharge today following 1-2 sessions of PT as long as they are meeting their goals. Patient is scheduled for OPPT. Follow up in the office in 2 weeks.   Griffith Citron, PA-C Orthopedic Surgery [phone] 07/03/2021, 8:15 AM

## 2021-07-03 NOTE — Plan of Care (Signed)

## 2021-07-04 DIAGNOSIS — M25562 Pain in left knee: Secondary | ICD-10-CM | POA: Diagnosis not present

## 2021-07-07 DIAGNOSIS — M25562 Pain in left knee: Secondary | ICD-10-CM | POA: Diagnosis not present

## 2021-07-09 DIAGNOSIS — M25562 Pain in left knee: Secondary | ICD-10-CM | POA: Diagnosis not present

## 2021-07-10 DIAGNOSIS — R152 Fecal urgency: Secondary | ICD-10-CM | POA: Diagnosis not present

## 2021-07-10 DIAGNOSIS — R159 Full incontinence of feces: Secondary | ICD-10-CM | POA: Diagnosis not present

## 2021-07-10 NOTE — Discharge Summary (Signed)
Patient ID: Stephen Nunez MRN: [id number] DOB/AGE: February 09, 1932 88 y.o.  Admit date: 07/01/2021 Discharge date: 07/03/2021  Admission Diagnoses:  Left knee osteoarthritis  Discharge Diagnoses:  Principal Problem:   S/P total knee arthroplasty, left   Past Medical History:  Diagnosis Date   Aortic stenosis    Arthritis    Bell's palsy    mild, left   Heart murmur    History of shingles    2000   Hyperlipidemia    Hypertension    OSA (obstructive sleep apnea)    Pneumonia    as an infant   Post herpetic neuralgia    Prostate cancer    prostate cancer   Tremor     Surgeries: Procedure(s): TOTAL KNEE ARTHROPLASTY on 07/01/2021   Consultants:   Discharged Condition: Improved  Hospital Course: Stephen Nunez is an 88 y.o. male who was admitted 07/01/2021 for operative treatment ofS/P total knee arthroplasty, left. Patient has severe unremitting pain that affects sleep, daily activities, and work/hobbies. After pre-op clearance the patient was taken to the operating room on 07/01/2021 and underwent  Procedure(s): TOTAL KNEE ARTHROPLASTY.    Patient was given perioperative antibiotics:  Anti-infectives (From admission, onward)    Start     Dose/Rate Route Frequency Ordered Stop   07/01/21 1630  ceFAZolin (ANCEF) IVPB 2g/100 mL premix        2 g 200 mL/hr over 30 Minutes Intravenous Every 6 hours 07/01/21 1313 07/01/21 2301   07/01/21 1606  ceFAZolin (ANCEF) 2-4 GM/100ML-% IVPB       Note to Pharmacy: Salome Spotted L: cabinet override      07/01/21 1606 07/02/21 0414   07/01/21 0745  ceFAZolin (ANCEF) IVPB 2g/100 mL premix        2 g 200 mL/hr over 30 Minutes Intravenous On call to O.R. 07/01/21 0730 07/01/21 1028        Patient was given sequential compression devices, early ambulation, and chemoprophylaxis to prevent DVT. Patient worked with PT and was meeting their goals regarding safe ambulation and transfers.  Patient benefited maximally from hospital stay and  there were no complications.    Recent vital signs: No data found.   Recent laboratory studies: No results for input(s): WBC, HGB, HCT, PLT, NA, K, CL, CO2, BUN, CREATININE, GLUCOSE, INR, CALCIUM in the last 72 hours.  Invalid input(s): PT, 2   Discharge Medications:   Allergies as of 07/03/2021   No Known Allergies      Medication List     STOP taking these medications    aspirin EC 81 MG tablet Replaced by: aspirin 81 MG chewable tablet   traMADol 50 MG tablet Commonly known as: ULTRAM       TAKE these medications    acetaminophen 650 MG CR tablet Commonly known as: TYLENOL Take 1,300 mg by mouth every 8 (eight) hours as needed for pain.   aspirin 81 MG chewable tablet Chew 1 tablet (81 mg total) by mouth 2 (two) times daily for 28 days. For prevention of blood clot. May resume normal dose of 81 mg every other day after this prescription. Replaces: aspirin EC 81 MG tablet   Co Q-10 200 MG Caps Take 200 mg by mouth every other day.   docusate sodium 100 MG capsule Commonly known as: COLACE Take 1 capsule (100 mg total) by mouth 2 (two) times daily.   Glucosamine HCl 1500 MG Tabs Take 1,500 mg by mouth every other day.   GRAPE SEED  PO Take 2 capsules by mouth every other day. [place] Grape Capsules   HYDROcodone-acetaminophen 5-325 MG tablet Commonly known as: NORCO/VICODIN Take 1 tablet by mouth every 4 (four) hours as needed for severe pain. Do not take with Tramadol. May take tramadol instead if you have minimal pain.   methocarbamol 500 MG tablet Commonly known as: ROBAXIN Take 1 tablet (500 mg total) by mouth every 6 (six) hours as needed for muscle spasms.   multivitamin with minerals Tabs tablet Take 1 tablet by mouth every other day.   OVER THE COUNTER MEDICATION Apply 1 application topically 4 (four) times daily as needed (for back pain due to shingles.). CBD CREAM (HEMP)   polyethylene glycol 17 g packet Commonly known as: MIRALAX /  GLYCOLAX Take 17 g by mouth daily as needed for mild constipation.   quinapril 40 MG tablet Commonly known as: ACCUPRIL Take 40 mg by mouth daily.   rosuvastatin 20 MG tablet Commonly known as: CRESTOR Take 1 tablet (20 mg total) by mouth daily.   SYSTANE OP Place 1 drop into both eyes 2 (two) times daily as needed (dry eyes).   Vitamin B-12 5000 MCG Lozg Take 5,000 mcg by mouth every other day.   Vitamin D3 50 MCG (2000 UT) Tabs Take 2,000 Units by mouth every other day.   vitamin E 180 MG (400 UNITS) capsule Take 400 Units by mouth every other day.               Discharge Care Instructions  (From admission, onward)           Start     Ordered   07/03/21 0000  Change dressing       Comments: Maintain surgical dressing until follow up in the clinic. If the edges start to pull up, may reinforce with tape. If the dressing is no longer working, may remove and cover with gauze and tape, but must keep the area dry and clean.  Call with any questions or concerns.   07/03/21 0820   07/03/21 0000  Change dressing       Comments: Maintain surgical dressing until follow up in the clinic. If the edges start to pull up, may reinforce with tape. If the dressing is no longer working, may remove and cover with gauze and tape, but must keep the area dry and clean.  Call with any questions or concerns.   07/03/21 0820            Diagnostic Studies: ECHOCARDIOGRAM COMPLETE  Result Date: 06/21/2021    ECHOCARDIOGRAM REPORT   Patient Name:   Stephen Nunez Date of Exam: 06/20/2021 Medical Rec #:  [id number]       Height:       70.5 in Accession #:    [id number]      Weight:       165.6 lb Date of Birth:  Dec 11, 1931      BSA:          1.937 m Patient Age:    4 years        BP:           130/70 mmHg Patient Gender: M               HR:           75 bpm. Exam Location:  [address] Procedure: 2D Echo, Cardiac Doppler and Color Doppler Indications:    I35.0 Aortic Stenosis  History:  Patient has prior history of Echocardiogram examinations, most                 recent 07/08/2020. Signs/Symptoms:Murmur; Risk                 Factors:Hypertension, Dyslipidemia and Sleep Apnea.  Sonographer:    Cresenciano Lick RDCS Referring Phys: [id number] PAULA V ROSS IMPRESSIONS  1. Left ventricular ejection fraction, by estimation, is 50 to 55%. The left ventricle has low normal function. The left ventricle has no regional wall motion abnormalities. Left ventricular diastolic parameters are indeterminate.  2. Right ventricular systolic function is normal. The right ventricular size is normal. There is normal pulmonary artery systolic pressure. The estimated right ventricular systolic pressure is 89.2 mmHg.  3. Left atrial size was mildly dilated.  4. Right atrial size was moderately dilated.  5. The mitral valve is grossly normal. Mild mitral valve regurgitation. No evidence of mitral stenosis. Moderate mitral annular calcification.  6. Tricuspid valve regurgitation is moderate.  7. The aortic valve is tricuspid. There is moderate calcification of the aortic valve. There is moderate thickening of the aortic valve. Aortic valve regurgitation is mild to moderate. Mild to moderate aortic valve stenosis. Aortic valve area, by VTI measures 1.20 cm. Aortic valve mean gradient measures 15.0 mmHg.  8. Aortic dilatation noted. There is borderline dilatation of the ascending aorta, measuring 38 mm.  9. The inferior vena cava is normal in size with greater than 50% respiratory variability, suggesting right atrial pressure of 3 mmHg. Comparison(s): No significant change from prior study. FINDINGS  Left Ventricle: Left ventricular ejection fraction, by estimation, is 50 to 55%. The left ventricle has low normal function. The left ventricle has no regional wall motion abnormalities. The left ventricular internal cavity size was normal in size. There is no left ventricular hypertrophy. Left ventricular diastolic  parameters are indeterminate. Right Ventricle: The right ventricular size is normal. No increase in right ventricular wall thickness. Right ventricular systolic function is normal. There is normal pulmonary artery systolic pressure. The tricuspid regurgitant velocity is 2.66 m/s, and  with an assumed right atrial pressure of 3 mmHg, the estimated right ventricular systolic pressure is 11.9 mmHg. Left Atrium: Left atrial size was mildly dilated. Right Atrium: Right atrial size was moderately dilated. Pericardium: There is no evidence of pericardial effusion. Mitral Valve: The mitral valve is grossly normal. Moderate mitral annular calcification. Mild mitral valve regurgitation. No evidence of mitral valve stenosis. Tricuspid Valve: The tricuspid valve is normal in structure. Tricuspid valve regurgitation is moderate . No evidence of tricuspid stenosis. Aortic Valve: LVOT/AV VTI 0.38, consistent with moderate AS. The aortic valve is tricuspid. There is moderate calcification of the aortic valve. There is moderate thickening of the aortic valve. Aortic valve regurgitation is mild to moderate. Aortic regurgitation PHT measures 448 msec. Mild to moderate aortic stenosis is present. Aortic valve mean gradient measures 15.0 mmHg. Aortic valve peak gradient measures 26.4 mmHg. Aortic valve area, by VTI measures 1.20 cm. Pulmonic Valve: The pulmonic valve was grossly normal. Pulmonic valve regurgitation is trivial. No evidence of pulmonic stenosis. Aorta: Aortic dilatation noted. There is borderline dilatation of the ascending aorta, measuring 38 mm. Venous: The inferior vena cava is normal in size with greater than 50% respiratory variability, suggesting right atrial pressure of 3 mmHg. IAS/Shunts: The atrial septum is grossly normal.  LEFT VENTRICLE PLAX 2D LVIDd:         4.70 cm   Diastology LVIDs:  3.30 cm   LV e' medial:   6.31 cm/s LV PW:         1.10 cm   LV E/e' medial: 11.3 LV IVS:        1.00 cm LVOT diam:      2.00 cm LV SV:         71 LV SV Index:   37 LVOT Area:     3.14 cm  RIGHT VENTRICLE RV Basal diam:  4.10 cm RV S prime:     15.30 cm/s TAPSE (M-mode): 2.7 cm LEFT ATRIUM             Index        RIGHT ATRIUM           Index LA diam:        4.10 cm 2.12 cm/m   RA Area:     19.00 cm LA Vol (A2C):   53.2 ml 27.47 ml/m  RA Volume:   62.90 ml  32.48 ml/m LA Vol (A4C):   55.4 ml 28.61 ml/m LA Biplane Vol: 54.3 ml 28.04 ml/m  AORTIC VALVE AV Area (Vmax):    1.11 cm AV Area (Vmean):   1.07 cm AV Area (VTI):     1.20 cm AV Vmax:           256.80 cm/s AV Vmean:          181.000 cm/s AV VTI:            0.592 m AV Peak Grad:      26.4 mmHg AV Mean Grad:      15.0 mmHg LVOT Vmax:         90.60 cm/s LVOT Vmean:        61.550 cm/s LVOT VTI:          0.227 m LVOT/AV VTI ratio: 0.38 AI PHT:            448 msec  AORTA Ao Root diam: 4.10 cm Ao Asc diam:  3.80 cm MITRAL VALVE                TRICUSPID VALVE MV Area (PHT): 3.37 cm     TR Peak grad:   28.3 mmHg MV Decel Time: 225 msec     TR Vmax:        266.00 cm/s MV E velocity: 71.00 cm/s MV A velocity: 117.00 cm/s  SHUNTS MV E/A ratio:  0.61         Systemic VTI:  0.23 m                             Systemic Diam: 2.00 cm Buford Dresser MD Electronically signed by Buford Dresser MD Signature Date/Time: 06/21/2021/3:13:49 PM    Final     Disposition: Discharge disposition: 01-Home or Self Care       Discharge Instructions     Call MD / Call 911   Complete by: As directed    If you experience chest pain or shortness of breath, CALL 911 and be transported to the hospital emergency room.  If you develope a fever above 101 F, pus (white drainage) or increased drainage or redness at the wound, or calf pain, call your surgeon's office.   Change dressing   Complete by: As directed    Maintain surgical dressing until follow up in the clinic. If the edges start to pull up, may reinforce with tape. If the dressing is  no longer working, may remove and  cover with gauze and tape, but must keep the area dry and clean.  Call with any questions or concerns.   Change dressing   Complete by: As directed    Maintain surgical dressing until follow up in the clinic. If the edges start to pull up, may reinforce with tape. If the dressing is no longer working, may remove and cover with gauze and tape, but must keep the area dry and clean.  Call with any questions or concerns.   Constipation Prevention   Complete by: As directed    Drink plenty of fluids.  Prune juice may be helpful.  You may use a stool softener, such as Colace (over the counter) 100 mg twice a day.  Use MiraLax (over the counter) for constipation as needed.   Diet - low sodium heart healthy   Complete by: As directed    Increase activity slowly as tolerated   Complete by: As directed    Weight bearing as tolerated with assist device (walker, cane, etc) as directed, use it as long as suggested by your surgeon or therapist, typically at least 4-6 weeks.   Post-operative opioid taper instructions:   Complete by: As directed    POST-OPERATIVE OPIOID TAPER INSTRUCTIONS: It is important to wean off of your opioid medication as soon as possible. If you do not need pain medication after your surgery it is ok to stop day one. Opioids include: Codeine, Hydrocodone(Norco, Vicodin), Oxycodone(Percocet, oxycontin) and hydromorphone amongst others.  Long term and even short term use of opiods can cause: Increased pain response Dependence Constipation Depression Respiratory depression And more.  Withdrawal symptoms can include Flu like symptoms Nausea, vomiting And more Techniques to manage these symptoms Hydrate well Eat regular healthy meals Stay active Use relaxation techniques(deep breathing, meditating, yoga) Do Not substitute Alcohol to help with tapering If you have been on opioids for less than two weeks and do not have pain than it is ok to stop all together.  Plan to wean off  of opioids This plan should start within one week post op of your joint replacement. Maintain the same interval or time between taking each dose and first decrease the dose.  Cut the total daily intake of opioids by one tablet each day Next start to increase the time between doses. The last dose that should be eliminated is the evening dose.      TED hose   Complete by: As directed    Use stockings (TED hose) for 2 weeks on both leg(s).  You may remove them at night for sleeping.   TED hose   Complete by: As directed    Use stockings (TED hose) for 2 weeks on both leg(s).  You may remove them at night for sleeping.        Follow-up Information     Paralee Cancel, MD. Go on 07/16/2021.   Specialty: Orthopedic Surgery Why: You are scheduled for first post op appointment on Wednesday January 18th at 10:00am. Contact information: [address] [place] [address] [phone]         Rosilyn Mings.. Go on 07/04/2021.   Why: You are scheduled for physical therapy evaluation on Friday January 6th at 8:45am. Contact information: [address] [place] [address] [phone]                  Signed: Irving Copas 07/10/2021, 2:17 PM

## 2021-07-11 DIAGNOSIS — M25562 Pain in left knee: Secondary | ICD-10-CM | POA: Diagnosis not present

## 2021-07-14 DIAGNOSIS — M25562 Pain in left knee: Secondary | ICD-10-CM | POA: Diagnosis not present

## 2021-07-16 DIAGNOSIS — M25562 Pain in left knee: Secondary | ICD-10-CM | POA: Diagnosis not present

## 2021-07-18 DIAGNOSIS — M25562 Pain in left knee: Secondary | ICD-10-CM | POA: Diagnosis not present

## 2021-07-22 DIAGNOSIS — M25562 Pain in left knee: Secondary | ICD-10-CM | POA: Diagnosis not present

## 2021-07-25 DIAGNOSIS — R195 Other fecal abnormalities: Secondary | ICD-10-CM | POA: Diagnosis not present

## 2021-07-29 DIAGNOSIS — M25562 Pain in left knee: Secondary | ICD-10-CM | POA: Diagnosis not present

## 2021-07-31 DIAGNOSIS — M25562 Pain in left knee: Secondary | ICD-10-CM | POA: Diagnosis not present

## 2021-08-04 DIAGNOSIS — M25562 Pain in left knee: Secondary | ICD-10-CM | POA: Diagnosis not present

## 2021-08-06 DIAGNOSIS — M25562 Pain in left knee: Secondary | ICD-10-CM | POA: Diagnosis not present

## 2021-08-13 DIAGNOSIS — M25562 Pain in left knee: Secondary | ICD-10-CM | POA: Diagnosis not present

## 2021-08-13 DIAGNOSIS — Z4789 Encounter for other orthopedic aftercare: Secondary | ICD-10-CM | POA: Diagnosis not present

## 2021-08-13 DIAGNOSIS — Z96652 Presence of left artificial knee joint: Secondary | ICD-10-CM | POA: Diagnosis not present

## 2021-08-13 DIAGNOSIS — Z471 Aftercare following joint replacement surgery: Secondary | ICD-10-CM | POA: Diagnosis not present

## 2021-08-18 DIAGNOSIS — M25562 Pain in left knee: Secondary | ICD-10-CM | POA: Diagnosis not present

## 2021-08-20 DIAGNOSIS — M25562 Pain in left knee: Secondary | ICD-10-CM | POA: Diagnosis not present

## 2021-08-25 DIAGNOSIS — M25562 Pain in left knee: Secondary | ICD-10-CM | POA: Diagnosis not present

## 2021-09-17 DIAGNOSIS — H40013 Open angle with borderline findings, low risk, bilateral: Secondary | ICD-10-CM | POA: Diagnosis not present

## 2021-10-13 DIAGNOSIS — I10 Essential (primary) hypertension: Secondary | ICD-10-CM | POA: Diagnosis not present

## 2021-10-13 DIAGNOSIS — N183 Chronic kidney disease, stage 3 unspecified: Secondary | ICD-10-CM | POA: Diagnosis not present

## 2021-10-13 DIAGNOSIS — E7800 Pure hypercholesterolemia, unspecified: Secondary | ICD-10-CM | POA: Diagnosis not present

## 2022-01-01 DIAGNOSIS — H401232 Low-tension glaucoma, bilateral, moderate stage: Secondary | ICD-10-CM | POA: Diagnosis not present

## 2022-01-21 DIAGNOSIS — G250 Essential tremor: Secondary | ICD-10-CM | POA: Diagnosis not present

## 2022-01-21 DIAGNOSIS — B0223 Postherpetic polyneuropathy: Secondary | ICD-10-CM | POA: Diagnosis not present

## 2022-01-21 DIAGNOSIS — R011 Cardiac murmur, unspecified: Secondary | ICD-10-CM | POA: Diagnosis not present

## 2022-01-21 DIAGNOSIS — M545 Low back pain, unspecified: Secondary | ICD-10-CM | POA: Diagnosis not present

## 2022-01-21 DIAGNOSIS — M1990 Unspecified osteoarthritis, unspecified site: Secondary | ICD-10-CM | POA: Diagnosis not present

## 2022-01-21 DIAGNOSIS — D692 Other nonthrombocytopenic purpura: Secondary | ICD-10-CM | POA: Diagnosis not present

## 2022-01-21 DIAGNOSIS — E785 Hyperlipidemia, unspecified: Secondary | ICD-10-CM | POA: Diagnosis not present

## 2022-01-21 DIAGNOSIS — E663 Overweight: Secondary | ICD-10-CM | POA: Diagnosis not present

## 2022-01-21 DIAGNOSIS — Z87891 Personal history of nicotine dependence: Secondary | ICD-10-CM | POA: Diagnosis not present

## 2022-01-21 DIAGNOSIS — I10 Essential (primary) hypertension: Secondary | ICD-10-CM | POA: Diagnosis not present

## 2022-01-21 DIAGNOSIS — G8929 Other chronic pain: Secondary | ICD-10-CM | POA: Diagnosis not present

## 2022-01-21 DIAGNOSIS — Z7982 Long term (current) use of aspirin: Secondary | ICD-10-CM | POA: Diagnosis not present

## 2022-02-03 ENCOUNTER — Other Ambulatory Visit: Payer: Self-pay | Admitting: Gastroenterology

## 2022-02-03 DIAGNOSIS — R195 Other fecal abnormalities: Secondary | ICD-10-CM

## 2022-02-03 DIAGNOSIS — R634 Abnormal weight loss: Secondary | ICD-10-CM | POA: Diagnosis not present

## 2022-02-09 DIAGNOSIS — R195 Other fecal abnormalities: Secondary | ICD-10-CM | POA: Diagnosis not present

## 2022-02-09 DIAGNOSIS — R634 Abnormal weight loss: Secondary | ICD-10-CM | POA: Diagnosis not present

## 2022-02-11 DIAGNOSIS — I10 Essential (primary) hypertension: Secondary | ICD-10-CM | POA: Diagnosis not present

## 2022-02-11 DIAGNOSIS — E7800 Pure hypercholesterolemia, unspecified: Secondary | ICD-10-CM | POA: Diagnosis not present

## 2022-02-11 DIAGNOSIS — Z8546 Personal history of malignant neoplasm of prostate: Secondary | ICD-10-CM | POA: Diagnosis not present

## 2022-02-20 DIAGNOSIS — R413 Other amnesia: Secondary | ICD-10-CM | POA: Diagnosis not present

## 2022-02-20 DIAGNOSIS — N183 Chronic kidney disease, stage 3 unspecified: Secondary | ICD-10-CM | POA: Diagnosis not present

## 2022-02-20 DIAGNOSIS — E7800 Pure hypercholesterolemia, unspecified: Secondary | ICD-10-CM | POA: Diagnosis not present

## 2022-02-20 DIAGNOSIS — B0229 Other postherpetic nervous system involvement: Secondary | ICD-10-CM | POA: Diagnosis not present

## 2022-02-20 DIAGNOSIS — I10 Essential (primary) hypertension: Secondary | ICD-10-CM | POA: Diagnosis not present

## 2022-02-20 DIAGNOSIS — Z0000 Encounter for general adult medical examination without abnormal findings: Secondary | ICD-10-CM | POA: Diagnosis not present

## 2022-02-20 DIAGNOSIS — Z8546 Personal history of malignant neoplasm of prostate: Secondary | ICD-10-CM | POA: Diagnosis not present

## 2022-02-23 DIAGNOSIS — H903 Sensorineural hearing loss, bilateral: Secondary | ICD-10-CM | POA: Diagnosis not present

## 2022-02-26 ENCOUNTER — Ambulatory Visit
Admission: RE | Admit: 2022-02-26 | Discharge: 2022-02-26 | Disposition: A | Discharge status: Home / Self Care | Payer: PPO | Source: Ambulatory Visit | Attending: Gastroenterology | Admitting: Gastroenterology

## 2022-02-26 DIAGNOSIS — K5790 Diverticulosis of intestine, part unspecified, without perforation or abscess without bleeding: Secondary | ICD-10-CM | POA: Diagnosis not present

## 2022-02-26 DIAGNOSIS — R634 Abnormal weight loss: Secondary | ICD-10-CM | POA: Diagnosis not present

## 2022-02-26 DIAGNOSIS — N281 Cyst of kidney, acquired: Secondary | ICD-10-CM | POA: Diagnosis not present

## 2022-02-26 DIAGNOSIS — R195 Other fecal abnormalities: Secondary | ICD-10-CM

## 2022-02-26 DIAGNOSIS — K7689 Other specified diseases of liver: Secondary | ICD-10-CM | POA: Diagnosis not present

## 2022-02-26 MED ORDER — IOPAMIDOL (ISOVUE-300) INJECTION 61%
100.0000 mL | Freq: Once | INTRAVENOUS | Status: AC | PRN
  Administered 2022-02-26: 100 mL via INTRAVENOUS

## 2022-03-03 DIAGNOSIS — L821 Other seborrheic keratosis: Secondary | ICD-10-CM | POA: Diagnosis not present

## 2022-03-03 DIAGNOSIS — L820 Inflamed seborrheic keratosis: Secondary | ICD-10-CM | POA: Diagnosis not present

## 2022-03-03 DIAGNOSIS — B0229 Other postherpetic nervous system involvement: Secondary | ICD-10-CM | POA: Diagnosis not present

## 2022-03-03 DIAGNOSIS — L218 Other seborrheic dermatitis: Secondary | ICD-10-CM | POA: Diagnosis not present

## 2022-03-03 DIAGNOSIS — D692 Other nonthrombocytopenic purpura: Secondary | ICD-10-CM | POA: Diagnosis not present

## 2022-03-14 ENCOUNTER — Other Ambulatory Visit: Payer: Self-pay | Admitting: Internal Medicine

## 2022-04-06 DIAGNOSIS — Z961 Presence of intraocular lens: Secondary | ICD-10-CM | POA: Diagnosis not present

## 2022-04-06 DIAGNOSIS — H401232 Low-tension glaucoma, bilateral, moderate stage: Secondary | ICD-10-CM | POA: Diagnosis not present

## 2022-04-09 DIAGNOSIS — H903 Sensorineural hearing loss, bilateral: Secondary | ICD-10-CM | POA: Diagnosis not present

## 2022-04-09 DIAGNOSIS — H6123 Impacted cerumen, bilateral: Secondary | ICD-10-CM | POA: Diagnosis not present

## 2022-04-15 DIAGNOSIS — K580 Irritable bowel syndrome with diarrhea: Secondary | ICD-10-CM | POA: Diagnosis not present

## 2022-04-15 NOTE — Progress Notes (Unsigned)
Cardiology Office Note   Date:  04/16/2022   ID:  Stephen Nunez, DOB 08-Apr-1932, MRN [id number]  PCP:  Lawerance Cruel, MD  Cardiologist:   Dorris Carnes, MD   F/U of HTN and aortic stenosis      History of Present Illness: Stephen Nunez is a 88 y.o. male with a history of HTN, HL, mild AS and LBBB   Myovue in 2015 was normal       I saw the pt in Dec 2022  He is also followed by [facility] Maysel Mccolm Over the past couple months he has had some swellign in legs    Denies change in diet    Denies SOB   No CP  No dizziness  No PND  Current Meds  Medication Sig   acetaminophen (TYLENOL) 650 MG CR tablet Take 1,300 mg by mouth every 8 (eight) hours as needed for pain.   amLODipine (NORVASC) 10 MG tablet Take 10 mg by mouth daily.   aspirin 81 MG chewable tablet CHEW 1 TABLET (81 MG TOTAL) BY MOUTH 2 (TWO) TIMES DAILY FOR 28 DAYS. FOR PREVENTION OF BLOOD CLOT. MAY RESUME NORMAL DOSE OF 81 MG EVERY OTHER DAY AFTER THIS PRESCRIPTION.   Bioflavonoid Products (GRAPE SEED PO) Take 2 capsules by mouth every other day. [place] Grape Capsules   Cholecalciferol (VITAMIN D3) 2000 units TABS Take 2,000 Units by mouth every other day.   Coenzyme Q10 (CO Q-10) 200 MG CAPS Take 200 mg by mouth every other day.   Cyanocobalamin (VITAMIN B-12) 5000 MCG LOZG Take 5,000 mcg by mouth every other day.   docusate sodium (COLACE) 100 MG capsule Take 1 capsule (100 mg total) by mouth 2 (two) times daily.   Glucosamine HCl 1500 MG TABS Take 1,500 mg by mouth every other day.   HYDROcodone-acetaminophen (NORCO/VICODIN) 5-325 MG tablet Take 1 tablet by mouth every 4 (four) hours as needed for severe pain. Do not take with Tramadol. May take tramadol instead if you have minimal pain.   latanoprost (XALATAN) 0.005 % ophthalmic solution 1 drop at bedtime.   lidocaine (LMX) 4 % cream Apply 1 Application topically 3 (three) times daily as needed.   lisinopril (ZESTRIL) 40 MG tablet Take 40 mg by mouth daily.    methocarbamol (ROBAXIN) 500 MG tablet Take 1 tablet (500 mg total) by mouth every 6 (six) hours as needed for muscle spasms.   Multiple Vitamin (MULTIVITAMIN WITH MINERALS) TABS tablet Take 1 tablet by mouth every other day.   OVER THE COUNTER MEDICATION Apply 1 application topically 4 (four) times daily as needed (for back pain due to shingles.). CBD CREAM (HEMP)   Polyethyl Glycol-Propyl Glycol (SYSTANE OP) Place 1 drop into both eyes 2 (two) times daily as needed (dry eyes).   polyethylene glycol (MIRALAX / GLYCOLAX) 17 g packet Take 17 g by mouth daily as needed for mild constipation.   quinapril (ACCUPRIL) 40 MG tablet Take 40 mg by mouth daily.   rosuvastatin (CRESTOR) 20 MG tablet TAKE 1 TABLET BY MOUTH EVERY DAY   vitamin E 400 UNIT capsule Take 400 Units by mouth every other day.     Allergies:   Patient has no known allergies.   Past Medical History:  Diagnosis Date   Aortic stenosis    Arthritis    Bell's palsy    mild, left   Heart murmur    History of shingles    2000   Hyperlipidemia    Hypertension  OSA (obstructive sleep apnea)    Pneumonia    as an infant   Post herpetic neuralgia    Prostate cancer    prostate cancer   Tremor     Past Surgical History:  Procedure Laterality Date   COLONOSCOPY     HERNIA REPAIR     INGUINAL HERNIA REPAIR Right 09/07/2017   Procedure: OPEN REPAIR RIGHT INGUINAL HERNIA;  Surgeon: Armandina Gemma, MD;  Location: [facility];  Service: General;  Laterality: Right;   INSERTION OF MESH Right 09/07/2017   Procedure: INSERTION OF MESH;  Surgeon: Armandina Gemma, MD;  Location: [facility];  Service: General;  Laterality: Right;   PROSTATE SURGERY     SPINAL CORD STIMULATOR INSERTION N/A 04/26/2015   Procedure: LUMBAR SPINAL CORD STIMULATOR INSERTION;  Surgeon: Clydell Hakim, MD;  Location: [facility];  Service: Neurosurgery;  Laterality: N/A;   SPINAL CORD STIMULATOR REMOVAL N/A 05/15/2016   Procedure: THORACIC SPINAL CORD STIMULATOR REMOVAL;   Surgeon: Clydell Hakim, MD;  Location: [facility];  Service: Neurosurgery;  Laterality: N/A;  THORACIC SPINAL CORD STIMULATOR REMOVAL   TONSILLECTOMY     TOTAL KNEE ARTHROPLASTY Left 07/01/2021   Procedure: TOTAL KNEE ARTHROPLASTY;  Surgeon: Paralee Cancel, MD;  Location: [facility];  Service: Orthopedics;  Laterality: Left;     Social History:  The patient  reports that he has never smoked. He has never used smokeless tobacco. He reports that he does not drink alcohol and does not use drugs.   Family History:  The patient's family history includes Cancer in his mother; Heart attack in his brother and father; Multiple sclerosis in his brother; Other in his mother; Stroke in his brother.    ROS:  Please see the history of present illness. All other systems are reviewed and  Negative to the above problem except as noted.    PHYSICAL EXAM: VS:  BP 120/60   Pulse 63   Ht '5\' 9"'$  (1.753 m)   Wt 164 lb 12.8 oz (74.8 kg)   SpO2 97%   BMI 24.34 kg/m   GEN: Thin 88 yo, in no acute distress  HEENT: normal  Neck: no JVD, carotid bruits,  Cardiac: RRR; Gr III/VI sytolic murmur base   Mid to later peaking   1+ LE edema    Respiratory:  clear to auscultation bilaterally,  GI: soft, nontender, nondistended, + BS  No hepatomegaly  MS: Patient is kyphotic  Moving all extremities   Skin: warm and dry, no rash Neuro:  Rest tremor arms/hands   Otherwise derred   Psych: euthymic mood, full affect   EKG:  EKG   SB 63 bpm   LBBB ECHO Jan 2022  1. Left ventricular ejection fraction, by estimation, is 50 to 55%. The left ventricle has low normal function. The left ventricle has no regional wall motion abnormalities. There is moderate concentric left ventricular hypertrophy. Left ventricular diastolic parameters are consistent with Grade I diastolic dysfunction (impaired relaxation). 2. Right ventricular systolic function is normal. The right ventricular size is normal. 3. The mitral valve is grossly normal. No  evidence of mitral valve regurgitation. 4. Tricuspid valve regurgitation is mild to moderate. 5. The aortic valve is tricuspid. There is moderate calcification of the aortic valve. There is moderate thickening of the aortic valve. Aortic valve regurgitation is mild to moderate. Mild to moderate aortic valve stenosis. Comparison(s): A prior study was performed on 02/08/2019. No significant change from prior study. Lipid Panel    Component Value  Date/Time   CHOL 149 10/28/2020 1009   TRIG 82 10/28/2020 1009   HDL 60 10/28/2020 1009   CHOLHDL 2.5 10/28/2020 1009   LDLCALC 73 10/28/2020 1009      Wt Readings from Last 3 Encounters:  04/16/22 164 lb 12.8 oz (74.8 kg)  07/01/21 168 lb (76.2 kg)  06/06/21 165 lb 9.6 oz (75.1 kg)      ASSESSMENT AND PLAN:  1  Aortic stenosis   Pt had echo in Dec 2022  Mild to moderate    Will repeat   2  LE edema  Overall volume looks OK but does have edema   Will check labs     He has been on amlodipine for years without problem      3  HTN   Adequate control  4  LBBB  Old  No change    Normal myovue 2015     No dizziness    5  HL  Lipids excellent  LDL 67  HDL 52     Current medicines are reviewed at length with the patient today.  The patient does not have concerns regarding medicines.  Signed, Dorris Carnes, MD  04/16/2022 2:03 PM    [address], [place], [place]  [address] Phone: [phone]; Fax: [phone]

## 2022-04-16 ENCOUNTER — Encounter: Payer: Self-pay | Admitting: Internal Medicine

## 2022-04-16 ENCOUNTER — Ambulatory Visit: Payer: PPO | Attending: Internal Medicine | Admitting: Internal Medicine

## 2022-04-16 VITALS — BP 120/60 | HR 63 | Ht 69.0 in | Wt 164.8 lb

## 2022-04-16 DIAGNOSIS — Z79899 Other long term (current) drug therapy: Secondary | ICD-10-CM

## 2022-04-16 DIAGNOSIS — I350 Nonrheumatic aortic (valve) stenosis: Secondary | ICD-10-CM

## 2022-04-16 NOTE — Patient Instructions (Signed)
Medication Instructions:   *If you need a refill on your cardiac medications before your next appointment, please call your pharmacy*   Lab Work: CBC, BMET, TSH, PRO BNP If you have labs (blood work) drawn today and your tests are completely normal, you will receive your results only by: [place] (if you have MyChart) OR A paper copy in the mail If you have any lab test that is abnormal or we need to change your treatment, we will call you to review the results.   Testing/Procedures: Your physician has requested that you have an echocardiogram. Echocardiography is a painless test that uses sound waves to create images of your heart. It provides your doctor with information about the size and shape of your heart and how well your heart's chambers and valves are working. This procedure takes approximately one hour. There are no restrictions for this procedure. Please do NOT wear cologne, perfume, aftershave, or lotions (deodorant is allowed). Please arrive 15 minutes prior to your appointment time.    Follow-Up: At [facility], you and your health needs are our priority.  As part of our continuing mission to provide you with exceptional heart care, we have created designated Provider Care Teams.  These Care Teams include your primary Cardiologist (physician) and Advanced Practice Providers (APPs -  Physician Assistants and Nurse Practitioners) who all work together to provide you with the care you need, when you need it.  We recommend signing up for the patient portal called "MyChart".  Sign up information is provided on this After Visit Summary.  MyChart is used to connect with patients for Virtual Visits (Telemedicine).  Patients are able to view lab/test results, encounter notes, upcoming appointments, etc.  Non-urgent messages can be sent to your provider as well.   To learn more about what you can do with MyChart, go to [email or web address].    Your next appointment:    6 month(s)  The format for your next appointment:   In Person  Provider:   Dorris Carnes, MD     Other Instructions   Important Information About Sugar

## 2022-04-17 LAB — BASIC METABOLIC PANEL
BUN/Creatinine Ratio: 27 — ABNORMAL HIGH (ref 10–24)
BUN: 30 mg/dL — ABNORMAL HIGH (ref 8–27)
CO2: 21 mmol/L (ref 20–29)
Calcium: 9.7 mg/dL (ref 8.6–10.2)
Chloride: 104 mmol/L (ref 96–106)
Creatinine, Ser: 1.13 mg/dL (ref 0.76–1.27)
Glucose: 96 mg/dL (ref 70–99)
Potassium: 4.8 mmol/L (ref 3.5–5.2)
Sodium: 142 mmol/L (ref 134–144)
eGFR: 63 mL/min/{1.73_m2} (ref >59)

## 2022-04-17 LAB — CBC
Hematocrit: 34.9 % — ABNORMAL LOW (ref 37.5–51.0)
Hemoglobin: 11.2 g/dL — ABNORMAL LOW (ref 13.0–17.7)
MCH: 30.6 pg (ref 26.6–33.0)
MCHC: 32.1 g/dL (ref 31.5–35.7)
MCV: 95 fL (ref 79–97)
Platelets: 204 10*3/uL (ref 150–450)
RBC: 3.66 x10E6/uL — ABNORMAL LOW (ref 4.14–5.80)
RDW: 12.9 % (ref 11.6–15.4)
WBC: 6.3 10*3/uL (ref 3.4–10.8)

## 2022-04-17 LAB — PRO B NATRIURETIC PEPTIDE: NT-Pro BNP: 677 pg/mL — ABNORMAL HIGH (ref 0–486)

## 2022-04-17 LAB — TSH: TSH: 6.54 u[IU]/mL — ABNORMAL HIGH (ref 0.450–4.500)

## 2022-04-22 ENCOUNTER — Telehealth: Payer: Self-pay

## 2022-04-22 DIAGNOSIS — I10 Essential (primary) hypertension: Secondary | ICD-10-CM

## 2022-04-22 DIAGNOSIS — Z79899 Other long term (current) drug therapy: Secondary | ICD-10-CM

## 2022-04-22 NOTE — Telephone Encounter (Signed)
-----   Message from [place], MD sent at 04/17/2022  4:10 PM EDT ----- Electrolytes and kidney function are normal  Fluid is up a little   Wait until after echo to address Mildly anemic  Hgb 11.2 Thyroid funciton   Needs free T3, free T4      Get when comes back for echo

## 2022-04-22 NOTE — Telephone Encounter (Signed)
Pts wife advised his lab results and will have at his Echo appt 05/04/22.

## 2022-05-04 ENCOUNTER — Ambulatory Visit: Payer: PPO | Attending: Internal Medicine

## 2022-05-04 ENCOUNTER — Ambulatory Visit: Payer: PPO

## 2022-05-04 DIAGNOSIS — I350 Nonrheumatic aortic (valve) stenosis: Secondary | ICD-10-CM | POA: Insufficient documentation

## 2022-05-04 DIAGNOSIS — I10 Essential (primary) hypertension: Secondary | ICD-10-CM | POA: Insufficient documentation

## 2022-05-04 DIAGNOSIS — Z79899 Other long term (current) drug therapy: Secondary | ICD-10-CM | POA: Insufficient documentation

## 2022-05-04 LAB — ECHOCARDIOGRAM COMPLETE
AR max vel: 1.24 cm2
AV Area VTI: 1.09 cm2
AV Area mean vel: 1.21 cm2
AV Mean grad: 30 mmHg
AV Peak grad: 51.8 mmHg
Ao pk vel: 3.6 m/s
Area-P 1/2: 2.92 cm2
P 1/2 time: 360 msec
S' Lateral: 2.8 cm

## 2022-05-05 ENCOUNTER — Telehealth: Payer: Self-pay

## 2022-05-05 DIAGNOSIS — I350 Nonrheumatic aortic (valve) stenosis: Secondary | ICD-10-CM

## 2022-05-05 LAB — T3, FREE: T3, Free: 2.7 pg/mL (ref 2.0–4.4)

## 2022-05-05 LAB — T4, FREE: Free T4: 0.88 ng/dL (ref 0.82–1.77)

## 2022-05-05 NOTE — Telephone Encounter (Signed)
-----   Message from Fay Records, MD sent at 05/05/2022  1:27 PM EST ----- Echo shows pumping function of the heart is normal  AV is thickened   Aortic stenosis is moderate to severe    It is narrower than on previous echo   But I would still recomm following     Aortic insuff is mild to moderate  Recomm follow up cho in July 2024

## 2022-05-05 NOTE — Addendum Note (Signed)
Addended by: Stephani Police on: 05/05/2022 01:44 PM   Modules accepted: Orders

## 2022-05-05 NOTE — Telephone Encounter (Signed)
Left a message for the pt to call back.  

## 2022-05-05 NOTE — Telephone Encounter (Signed)
-----   Message from Fay Records, MD sent at 05/05/2022  1:35 PM EST ----- Thyroid function is normal

## 2022-05-06 NOTE — Telephone Encounter (Signed)
The patient and his wife have been notified of the result and verbalized understanding.  All questions (if any) were answered. Bernestine Amass, RN 05/06/2022 11:09 AM

## 2022-05-06 NOTE — Telephone Encounter (Signed)
  Pt's wife calling to get echo result. She said, to call her at [phone]

## 2022-05-20 ENCOUNTER — Other Ambulatory Visit: Payer: Self-pay | Admitting: Internal Medicine

## 2022-05-28 DIAGNOSIS — B0229 Other postherpetic nervous system involvement: Secondary | ICD-10-CM | POA: Diagnosis not present

## 2022-06-10 DIAGNOSIS — Z6823 Body mass index (BMI) 23.0-23.9, adult: Secondary | ICD-10-CM | POA: Diagnosis not present

## 2022-06-10 DIAGNOSIS — I10 Essential (primary) hypertension: Secondary | ICD-10-CM | POA: Diagnosis not present

## 2022-06-10 DIAGNOSIS — R609 Edema, unspecified: Secondary | ICD-10-CM | POA: Diagnosis not present

## 2022-06-11 DIAGNOSIS — H4720 Unspecified optic atrophy: Secondary | ICD-10-CM | POA: Diagnosis not present

## 2022-06-11 DIAGNOSIS — H401231 Low-tension glaucoma, bilateral, mild stage: Secondary | ICD-10-CM | POA: Diagnosis not present

## 2022-07-01 DIAGNOSIS — B0229 Other postherpetic nervous system involvement: Secondary | ICD-10-CM | POA: Diagnosis not present

## 2022-07-15 DIAGNOSIS — R609 Edema, unspecified: Secondary | ICD-10-CM | POA: Diagnosis not present

## 2022-07-22 DIAGNOSIS — R609 Edema, unspecified: Secondary | ICD-10-CM | POA: Diagnosis not present

## 2022-07-22 DIAGNOSIS — Z6824 Body mass index (BMI) 24.0-24.9, adult: Secondary | ICD-10-CM | POA: Diagnosis not present

## 2022-07-22 DIAGNOSIS — N1831 Chronic kidney disease, stage 3a: Secondary | ICD-10-CM | POA: Diagnosis not present

## 2022-07-22 DIAGNOSIS — I10 Essential (primary) hypertension: Secondary | ICD-10-CM | POA: Diagnosis not present

## 2022-08-11 DIAGNOSIS — L300 Nummular dermatitis: Secondary | ICD-10-CM | POA: Diagnosis not present

## 2022-09-08 DIAGNOSIS — B0229 Other postherpetic nervous system involvement: Secondary | ICD-10-CM | POA: Diagnosis not present

## 2022-09-08 DIAGNOSIS — Z79899 Other long term (current) drug therapy: Secondary | ICD-10-CM | POA: Diagnosis not present

## 2022-09-09 ENCOUNTER — Emergency Department (HOSPITAL_COMMUNITY): Payer: PPO

## 2022-09-09 ENCOUNTER — Encounter (HOSPITAL_COMMUNITY): Payer: Self-pay

## 2022-09-09 ENCOUNTER — Other Ambulatory Visit: Payer: Self-pay

## 2022-09-09 ENCOUNTER — Inpatient Hospital Stay (HOSPITAL_COMMUNITY)
Admission: EM | Admit: 2022-09-09 | Discharge: 2022-09-12 | DRG: 193 | Disposition: A | Discharge status: Home / Self Care | Payer: PPO | Attending: Family Medicine | Admitting: Family Medicine

## 2022-09-09 DIAGNOSIS — J168 Pneumonia due to other specified infectious organisms: Secondary | ICD-10-CM | POA: Diagnosis not present

## 2022-09-09 DIAGNOSIS — Z79899 Other long term (current) drug therapy: Secondary | ICD-10-CM | POA: Diagnosis not present

## 2022-09-09 DIAGNOSIS — J189 Pneumonia, unspecified organism: Principal | ICD-10-CM | POA: Diagnosis present

## 2022-09-09 DIAGNOSIS — Z86718 Personal history of other venous thrombosis and embolism: Secondary | ICD-10-CM

## 2022-09-09 DIAGNOSIS — Z66 Do not resuscitate: Secondary | ICD-10-CM | POA: Diagnosis present

## 2022-09-09 DIAGNOSIS — H9193 Unspecified hearing loss, bilateral: Secondary | ICD-10-CM | POA: Diagnosis not present

## 2022-09-09 DIAGNOSIS — Z820 Family history of epilepsy and other diseases of the nervous system: Secondary | ICD-10-CM | POA: Diagnosis not present

## 2022-09-09 DIAGNOSIS — R079 Chest pain, unspecified: Secondary | ICD-10-CM | POA: Diagnosis not present

## 2022-09-09 DIAGNOSIS — Z9079 Acquired absence of other genital organ(s): Secondary | ICD-10-CM

## 2022-09-09 DIAGNOSIS — B0229 Other postherpetic nervous system involvement: Secondary | ICD-10-CM | POA: Diagnosis not present

## 2022-09-09 DIAGNOSIS — E785 Hyperlipidemia, unspecified: Secondary | ICD-10-CM | POA: Diagnosis not present

## 2022-09-09 DIAGNOSIS — I214 Non-ST elevation (NSTEMI) myocardial infarction: Secondary | ICD-10-CM | POA: Diagnosis not present

## 2022-09-09 DIAGNOSIS — I447 Left bundle-branch block, unspecified: Secondary | ICD-10-CM | POA: Diagnosis present

## 2022-09-09 DIAGNOSIS — Z823 Family history of stroke: Secondary | ICD-10-CM

## 2022-09-09 DIAGNOSIS — Z8546 Personal history of malignant neoplasm of prostate: Secondary | ICD-10-CM

## 2022-09-09 DIAGNOSIS — R7989 Other specified abnormal findings of blood chemistry: Secondary | ICD-10-CM | POA: Diagnosis not present

## 2022-09-09 DIAGNOSIS — Z8619 Personal history of other infectious and parasitic diseases: Secondary | ICD-10-CM | POA: Diagnosis not present

## 2022-09-09 DIAGNOSIS — I350 Nonrheumatic aortic (valve) stenosis: Secondary | ICD-10-CM | POA: Diagnosis present

## 2022-09-09 DIAGNOSIS — G894 Chronic pain syndrome: Secondary | ICD-10-CM | POA: Diagnosis not present

## 2022-09-09 DIAGNOSIS — Z8249 Family history of ischemic heart disease and other diseases of the circulatory system: Secondary | ICD-10-CM | POA: Diagnosis not present

## 2022-09-09 DIAGNOSIS — Z7982 Long term (current) use of aspirin: Secondary | ICD-10-CM

## 2022-09-09 DIAGNOSIS — Z96652 Presence of left artificial knee joint: Secondary | ICD-10-CM | POA: Diagnosis not present

## 2022-09-09 DIAGNOSIS — I110 Hypertensive heart disease with heart failure: Secondary | ICD-10-CM | POA: Diagnosis not present

## 2022-09-09 DIAGNOSIS — R000 Tachycardia, unspecified: Secondary | ICD-10-CM | POA: Diagnosis not present

## 2022-09-09 DIAGNOSIS — Z1152 Encounter for screening for COVID-19: Secondary | ICD-10-CM

## 2022-09-09 DIAGNOSIS — I5032 Chronic diastolic (congestive) heart failure: Secondary | ICD-10-CM | POA: Diagnosis not present

## 2022-09-09 DIAGNOSIS — J9811 Atelectasis: Secondary | ICD-10-CM | POA: Diagnosis not present

## 2022-09-09 DIAGNOSIS — R531 Weakness: Secondary | ICD-10-CM | POA: Diagnosis not present

## 2022-09-09 LAB — URINALYSIS, ROUTINE W REFLEX MICROSCOPIC
Bilirubin Urine: NEGATIVE
Glucose, UA: NEGATIVE mg/dL
Ketones, ur: NEGATIVE mg/dL
Leukocytes,Ua: NEGATIVE
Nitrite: NEGATIVE
Protein, ur: NEGATIVE mg/dL
Specific Gravity, Urine: 1.01 (ref 1.005–1.030)
pH: 5 (ref 5.0–8.0)

## 2022-09-09 LAB — RESPIRATORY PANEL BY PCR

## 2022-09-09 LAB — RESP PANEL BY RT-PCR (RSV, FLU A&B, COVID)  RVPGX2
Influenza A by PCR: NEGATIVE
Influenza B by PCR: NEGATIVE
Resp Syncytial Virus by PCR: NEGATIVE
SARS Coronavirus 2 by RT PCR: NEGATIVE

## 2022-09-09 LAB — T4, FREE: Free T4: 0.93 ng/dL (ref 0.61–1.12)

## 2022-09-09 LAB — EXPECTORATED SPUTUM ASSESSMENT W GRAM STAIN, RFLX TO RESP C

## 2022-09-09 LAB — COMPREHENSIVE METABOLIC PANEL
ALT: 10 U/L (ref 0–44)
AST: 22 U/L (ref 15–41)
Albumin: 3.2 g/dL — ABNORMAL LOW (ref 3.5–5.0)
Alkaline Phosphatase: 56 U/L (ref 38–126)
Anion gap: 9 (ref 5–15)
BUN: 23 mg/dL (ref 8–23)
CO2: 25 mmol/L (ref 22–32)
Calcium: 8.1 mg/dL — ABNORMAL LOW (ref 8.9–10.3)
Chloride: 107 mmol/L (ref 98–111)
Creatinine, Ser: 1.22 mg/dL (ref 0.61–1.24)
GFR, Estimated: 57 mL/min — ABNORMAL LOW (ref >60)
Glucose, Bld: 104 mg/dL — ABNORMAL HIGH (ref 70–99)
Potassium: 3.4 mmol/L — ABNORMAL LOW (ref 3.5–5.1)
Sodium: 141 mmol/L (ref 135–145)
Total Bilirubin: 0.7 mg/dL (ref 0.3–1.2)
Total Protein: 6.3 g/dL — ABNORMAL LOW (ref 6.5–8.1)

## 2022-09-09 LAB — CBC WITH DIFFERENTIAL/PLATELET
Abs Immature Granulocytes: 0.08 10*3/uL — ABNORMAL HIGH (ref 0.00–0.07)
Basophils Absolute: 0 10*3/uL (ref 0.0–0.1)
Basophils Relative: 0 %
Eosinophils Absolute: 0.1 10*3/uL (ref 0.0–0.5)
Eosinophils Relative: 1 %
HCT: 30.4 % — ABNORMAL LOW (ref 39.0–52.0)
Hemoglobin: 9.5 g/dL — ABNORMAL LOW (ref 13.0–17.0)
Immature Granulocytes: 1 %
Lymphocytes Relative: 7 %
Lymphs Abs: 0.7 10*3/uL (ref 0.7–4.0)
MCH: 30.4 pg (ref 26.0–34.0)
MCHC: 31.3 g/dL (ref 30.0–36.0)
MCV: 97.4 fL (ref 80.0–100.0)
Monocytes Absolute: 1.1 10*3/uL — ABNORMAL HIGH (ref 0.1–1.0)
Monocytes Relative: 10 %
Neutro Abs: 8.5 10*3/uL — ABNORMAL HIGH (ref 1.7–7.7)
Neutrophils Relative %: 81 %
Platelets: 224 10*3/uL (ref 150–400)
RBC: 3.12 MIL/uL — ABNORMAL LOW (ref 4.22–5.81)
RDW: 13.2 % (ref 11.5–15.5)
WBC: 10.4 10*3/uL (ref 4.0–10.5)
nRBC: 0 % (ref 0.0–0.2)

## 2022-09-09 LAB — BRAIN NATRIURETIC PEPTIDE: B Natriuretic Peptide: 266.6 pg/mL — ABNORMAL HIGH (ref 0.0–100.0)

## 2022-09-09 LAB — TROPONIN I (HIGH SENSITIVITY)
Troponin I (High Sensitivity): 82 ng/L — ABNORMAL HIGH (ref <18)
Troponin I (High Sensitivity): 338 ng/L (ref <18)

## 2022-09-09 LAB — LACTIC ACID, PLASMA: Lactic Acid, Venous: 1.2 mmol/L (ref 0.5–1.9)

## 2022-09-09 LAB — STREP PNEUMONIAE URINARY ANTIGEN: Strep Pneumo Urinary Antigen: NEGATIVE

## 2022-09-09 LAB — TSH: TSH: 2.718 u[IU]/mL (ref 0.350–4.500)

## 2022-09-09 MED ORDER — POLYVINYL ALCOHOL 1.4 % OP SOLN: 1.0000 [drp] | Freq: Two times a day (BID) | OPHTHALMIC | Status: DC | PRN

## 2022-09-09 MED ORDER — POLYETHYLENE GLYCOL 3350 17 G PO PACK: 17.0000 g | PACK | Freq: Every day | ORAL | Status: DC | PRN

## 2022-09-09 MED ORDER — DOCUSATE SODIUM 100 MG PO CAPS
100.0000 mg | ORAL_CAPSULE | Freq: Two times a day (BID) | ORAL | Status: DC
  Administered 2022-09-09 – 2022-09-12 (×3): 100 mg via ORAL
  Filled 2022-09-09 (×4): qty 1

## 2022-09-09 MED ORDER — SODIUM CHLORIDE 0.9 % IV BOLUS
1000.0000 mL | Freq: Once | INTRAVENOUS | Status: AC
  Administered 2022-09-09: 1000 mL via INTRAVENOUS

## 2022-09-09 MED ORDER — LATANOPROST 0.005 % OP SOLN
1.0000 [drp] | Freq: Every day | OPHTHALMIC | Status: DC
  Administered 2022-09-09 – 2022-09-11 (×3): 1 [drp] via OPHTHALMIC
  Filled 2022-09-09: qty 2.5

## 2022-09-09 MED ORDER — SODIUM CHLORIDE 0.9 % IV SOLN
100.0000 mg | Freq: Once | INTRAVENOUS | Status: AC
  Administered 2022-09-09: 100 mg via INTRAVENOUS
  Filled 2022-09-09: qty 100

## 2022-09-09 MED ORDER — ROSUVASTATIN CALCIUM 20 MG PO TABS
20.0000 mg | ORAL_TABLET | Freq: Every day | ORAL | Status: DC
  Administered 2022-09-10 – 2022-09-12 (×3): 20 mg via ORAL
  Filled 2022-09-09 (×3): qty 1

## 2022-09-09 MED ORDER — SODIUM CHLORIDE 0.9 % IV SOLN
2.0000 g | INTRAVENOUS | Status: DC
  Administered 2022-09-10 – 2022-09-11 (×2): 2 g via INTRAVENOUS
  Filled 2022-09-09 (×2): qty 20

## 2022-09-09 MED ORDER — AMLODIPINE BESYLATE 5 MG PO TABS
10.0000 mg | ORAL_TABLET | Freq: Every day | ORAL | Status: DC
  Administered 2022-09-10 – 2022-09-12 (×3): 10 mg via ORAL
  Filled 2022-09-09 (×3): qty 2

## 2022-09-09 MED ORDER — METHOCARBAMOL 500 MG PO TABS: 500.0000 mg | ORAL_TABLET | Freq: Four times a day (QID) | ORAL | Status: DC | PRN

## 2022-09-09 MED ORDER — ACETAMINOPHEN 500 MG PO TABS
500.0000 mg | ORAL_TABLET | ORAL | Status: DC | PRN
  Administered 2022-09-09 – 2022-09-12 (×2): 500 mg via ORAL
  Filled 2022-09-09 (×2): qty 1

## 2022-09-09 MED ORDER — VITAMIN B-12 5000 MCG PO LOZG: 5000.0000 ug | LOZENGE | ORAL | Status: DC

## 2022-09-09 MED ORDER — LIDOCAINE 4 % EX CREA: 1.0000 | TOPICAL_CREAM | Freq: Four times a day (QID) | CUTANEOUS | Status: DC | PRN

## 2022-09-09 MED ORDER — ASPIRIN 81 MG PO CHEW
81.0000 mg | CHEWABLE_TABLET | ORAL | Status: DC
  Administered 2022-09-09 – 2022-09-11 (×2): 81 mg via ORAL
  Filled 2022-09-09 (×2): qty 1

## 2022-09-09 MED ORDER — ASPIRIN 81 MG PO CHEW: 81.0000 mg | CHEWABLE_TABLET | ORAL | Status: DC

## 2022-09-09 MED ORDER — SODIUM CHLORIDE 0.9 % IV SOLN
500.0000 mg | Freq: Every day | INTRAVENOUS | Status: DC
  Administered 2022-09-09 – 2022-09-11 (×3): 500 mg via INTRAVENOUS
  Filled 2022-09-09 (×3): qty 5

## 2022-09-09 MED ORDER — ACETAMINOPHEN 325 MG PO TABS
650.0000 mg | ORAL_TABLET | Freq: Once | ORAL | Status: AC
  Administered 2022-09-09: 650 mg via ORAL
  Filled 2022-09-09: qty 2

## 2022-09-09 MED ORDER — ENOXAPARIN SODIUM 40 MG/0.4ML IJ SOSY
40.0000 mg | PREFILLED_SYRINGE | Freq: Every day | INTRAMUSCULAR | Status: DC
  Administered 2022-09-09 – 2022-09-12 (×4): 40 mg via SUBCUTANEOUS
  Filled 2022-09-09 (×4): qty 0.4

## 2022-09-09 MED ORDER — ACETAMINOPHEN 325 MG PO TABS
650.0000 mg | ORAL_TABLET | Freq: Every day | ORAL | Status: DC
  Administered 2022-09-10 – 2022-09-12 (×3): 650 mg via ORAL
  Filled 2022-09-09 (×3): qty 2

## 2022-09-09 MED ORDER — CO Q-10 200 MG PO CAPS: 200.0000 mg | ORAL_CAPSULE | ORAL | Status: DC

## 2022-09-09 MED ORDER — SODIUM CHLORIDE 0.9 % IV SOLN
1.0000 g | Freq: Once | INTRAVENOUS | Status: AC
  Administered 2022-09-09: 1 g via INTRAVENOUS
  Filled 2022-09-09: qty 10

## 2022-09-09 NOTE — Progress Notes (Signed)
Notified by staff regarding elevated troponin Patient not having any chest pain whatsoever EKG my over read = NSR PR interval 0.20 QRS axis -20 degrees LBBB noted  No further interpretation could be performed and I discussed the case with Dr. Evalee Jefferson of cardiology who does not think that the patient has any change in EKG from prior but does agree with my plan to get an echo  We will follow--I think that the patient has may be an NSTEMI from underlying community-acquired pneumonia in the setting of HFpEF and mild to moderate aortic stenosis and I do not think that the patient would be a good candidate for any invasive strategy regardless  As is not having any chest pain that is cardiogenic would hold on heparin/nitrates  Verneita Griffes, MD [facility] 1:54 PM

## 2022-09-09 NOTE — Hospital Course (Addendum)
89 year old white male coming from home Known history of OSA Postherpetic neuralgia Prostate cancer DVT on Eliquis Aortic stenosis chronic LBBB Myoview 2015 normal Weight at last OV 164 pounds / 74 kg Chronic pain syndrome follows with [facility] Dr. Burton Apley with pain generator mainly in the left shoulder left upper back on tramadol  Was brought by EMS to the hospital because he felt wobbly on his feet cough for the past week O2 sat per EMS 80% improved to 95% with 2 L   Potassium 3.4 BUN/creatinine 23/1.22 which is about baseline LFTs are normal BNP 266 WBC 10  COVID RSV flu negative

## 2022-09-09 NOTE — H&P (Signed)
HPI  Stephen Nunez [id number] DOB: December 30, 1931 DOA: 09/09/2022  PCP: Lawerance Cruel, MD   Chief Complaint: Cristela Blue  HPI:  89 year old white male coming from home (IADL ADL fully functional uses cane) Known history of OSA Postherpetic neuralgia Prostate cancer DVT on Eliquis Aortic stenosis chronic LBBB Myoview 2015 normal Weight at last OV 164 pounds / 74 kg Chronic pain syndrome follows with [facility] Dr. Burton Apley with pain generator mainly in the left shoulder left upper back on tramadol  Was brought by EMS to the hospital because he felt wobbly on his feet cough for the past 3/7--Wife judy @ bedside suppl info-she tells me that they went out to do their usual affairs yesterday and he suddenly became weak-they both have been suffering for the past 3 to 4 days from upper respiratory issues in the ears and sinuses, hers resolved but his apparently got worse They are vaccinated against RSV flu COVID and took a pneumonia vaccine at last recommended timing  O2 sat per EMS 80% improved to 95% with 2 L He is quite hard of hearing which limits some of the history He is however able to tell me that he feels a little better than when he first came in after receiving IV fluid bolus  Review of Systems:  Pertinent +'s: + Cough + chills Pertinent -"s: - Dark stool - tarry stool - chest pain - swelling of limbs - tightness of belt or shoes or rings - weight gain - platypnea  ED Course: Given ceftriaxone doxycycline fluid bolus 1000 cc Labs performed including BNP troponin lactic acid   Past Medical History:  Diagnosis Date   Aortic stenosis    Arthritis    Bell's palsy    mild, left   Heart murmur    History of shingles    2000   Hyperlipidemia    Hypertension    OSA (obstructive sleep apnea)    Pneumonia    as an infant   Post herpetic neuralgia    Prostate cancer    prostate cancer   Tremor    Past Surgical History:  Procedure Laterality Date   COLONOSCOPY      HERNIA REPAIR     INGUINAL HERNIA REPAIR Right 09/07/2017   Procedure: OPEN REPAIR RIGHT INGUINAL HERNIA;  Surgeon: Armandina Gemma, MD;  Location: [facility];  Service: General;  Laterality: Right;   INSERTION OF MESH Right 09/07/2017   Procedure: INSERTION OF MESH;  Surgeon: Armandina Gemma, MD;  Location: [facility];  Service: General;  Laterality: Right;   PROSTATE SURGERY     SPINAL CORD STIMULATOR INSERTION N/A 04/26/2015   Procedure: LUMBAR SPINAL CORD STIMULATOR INSERTION;  Surgeon: Clydell Hakim, MD;  Location: [facility];  Service: Neurosurgery;  Laterality: N/A;   SPINAL CORD STIMULATOR REMOVAL N/A 05/15/2016   Procedure: THORACIC SPINAL CORD STIMULATOR REMOVAL;  Surgeon: Clydell Hakim, MD;  Location: [facility];  Service: Neurosurgery;  Laterality: N/A;  THORACIC SPINAL CORD STIMULATOR REMOVAL   TONSILLECTOMY     TOTAL KNEE ARTHROPLASTY Left 07/01/2021   Procedure: TOTAL KNEE ARTHROPLASTY;  Surgeon: Paralee Cancel, MD;  Location: [facility];  Service: Orthopedics;  Laterality: Left;    reports that he has never smoked. He has never used smokeless tobacco. He reports that he does not drink alcohol and does not use drugs.  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 Authorizing Provider  acetaminophen (TYLENOL) 650 MG CR tablet Take 1,300 mg by mouth every 8 (eight) hours as needed for pain.    [provider]  amLODipine (NORVASC) 10 MG tablet Take 10 mg by mouth daily. 02/20/22   [provider]  aspirin 81 MG chewable tablet CHEW 1  TABLET (81 MG TOTAL) BY MOUTH 2 (TWO) TIMES DAILY FOR 28 DAYS. FOR PREVENTION OF BLOOD CLOT. MAY RESUME NORMAL DOSE OF 81 MG EVERY OTHER DAY AFTER THIS PRESCRIPTION.    [provider]  Bioflavonoid Products (GRAPE SEED PO) Take 2 capsules by mouth every other day. [place] Grape Capsules    [provider]  Cholecalciferol (VITAMIN D3) 2000 units TABS Take 2,000 Units by mouth every other day.    [provider]  Coenzyme Q10 (CO Q-10) 200 MG CAPS Take 200 mg by mouth every other day.    [provider]  Cyanocobalamin (VITAMIN B-12) 5000 MCG LOZG Take 5,000 mcg by mouth every other day.    [provider]  docusate sodium (COLACE) 100 MG capsule Take 1 capsule (100 mg total) by mouth 2 (two) times daily. 07/03/21   Irving Copas, PA-C  Glucosamine HCl 1500 MG TABS Take 1,500 mg by mouth every other day.    [provider]  HYDROcodone-acetaminophen (NORCO/VICODIN) 5-325 MG tablet Take 1 tablet by mouth every 4 (four) hours as needed for severe pain. Do not take with Tramadol. May take tramadol instead if you have minimal pain. 07/03/21   Irving Copas, PA-C  latanoprost (XALATAN) 0.005 % ophthalmic solution 1 drop at bedtime. 03/29/22   [provider]  lidocaine (LMX) 4 % cream Apply 1 Application topically 3 (three) times daily as needed. 04/09/22   [provider]  lisinopril (ZESTRIL) 40 MG tablet Take 40 mg by mouth daily. 03/16/22   [provider]  methocarbamol (ROBAXIN) 500 MG tablet Take 1 tablet (500 mg total) by mouth every 6 (six) hours as needed for muscle spasms. 07/03/21   Irving Copas, PA-C  Multiple Vitamin (MULTIVITAMIN WITH MINERALS) TABS tablet Take 1 tablet by mouth every other day.    [provider]  OVER THE COUNTER MEDICATION Apply 1 application topically 4 (four) times daily as needed (for back pain due to shingles.). CBD CREAM (HEMP)    [provider]  Polyethyl  Glycol-Propyl Glycol (SYSTANE OP) Place 1 drop into both eyes 2 (two) times daily as needed (dry eyes).    [provider]  polyethylene glycol (MIRALAX / GLYCOLAX) 17 g packet Take 17 g by mouth daily as needed for mild constipation. 07/03/21   Irving Copas, PA-C  quinapril (ACCUPRIL) 40 MG tablet Take 40 mg by mouth daily.    [provider]  rosuvastatin (CRESTOR) 20 MG tablet Take 1 tablet (20 mg total) by mouth daily. 05/20/22   Fay Records, MD  vitamin E 400 UNIT capsule Take 400 Units by mouth every other day.    [provider]    Physical Exam:  Vitals:   09/09/22 0854  BP: (!) 146/100  Pulse: 96  Resp: 19  Temp: (!) 100.9 F (38.3 C)  SpO2: 97%    Awake coherent no distress Neck soft supple [id number] holosystolic murmur across precordium bisteroid L USC Chest is clear no wheeze rales rhonchi Abdomen soft no rebound no guarding No lower extremity edema No rash  I have personally reviewed following labs and imaging studies  Labs:  Potassium 3.4 BUN/creatinine 23/1.22 which is about baseline LFTs are normal BNP 266 WBC 10 Lactic acid negligible COVID RSV flu negative    Imaging studies:     Medical tests:  EKG independently reviewed: yes--LBBB, no ST -T inversions    Test discussed with performing physician: y   Decision to obtain old records:  y   Review and summation of old records:  y   Principal Problem:   CAP (community acquired pneumonia)   Assessment/Plan  SIRS criteria on admission 2/2 community-acquired pneumonia - Changed to azithromycin ceftriaxone at this time - NS 50 cc/H -Add on strep pneumo Legionella respiratory viral panel and expectorated sputum - Watch fever curve etc. and would consider SLP input if does not improve  HFpEF - Continue ASA 81 - On his MAR both quinapril and lisinopril are in place so I will hold both until we can confirm - Continue amlodipine 10, Crestor 20, coenzyme Q  10  Chronic LBBB Aortic stenosis - Outpatient follow-up with Dr. Harrington Challenger who will be CCed at discharge-I do not think that his BNP is representative of heart failure given lack of platypnea and clinically no signs of right-sided heart failure and has no JVD - Fluids as above only  Postherpetic neuralgia - Continue lidocaine cream 4% 4 times daily as needed, Robaxin 500 every 6 as needed spasm -I have discontinued Norco as he tells me he takes tramadol usually-Pharm.D. to reconcile orders -He will need to follow-up with his outpatient pain physician  DVT previously on Eliquis - Patient is not on this now?-Pharmacy to reconcile  Prior prostate cancer history back in year 2000 status post prostatectomy per patient --Outpatient interval follow-up  Severity of Illness: The appropriate patient status for this patient is INPATIENT. Inpatient status is judged to be reasonable and necessary in order to provide the required intensity of service to ensure the patient's safety. The patient's presenting symptoms, physical exam findings, and initial radiographic and laboratory data in the context of their chronic comorbidities is felt to place them at high risk for further clinical deterioration. Furthermore, it is not anticipated that the patient will be medically stable for discharge from the hospital within 2 midnights of admission.   * I certify that at the point of admission it is my clinical judgment that the patient will require inpatient hospital care spanning beyond 2 midnights from the point of admission due to high intensity of service, high risk for further deterioration and high frequency of surveillance required.*   DVT prophylaxis: Lovenox Code Status: DNR confirmed okay for fluids antibiotics Family Communication: Bethena Roys partner at bedside Consults called: None  Time spent: 56 minutes  Verlon Au, MD Jerl Mina my NP partners at night for Care related issues] [facility] --[facility] [employer] [facility] , [email or web address]; password [facility]  09/09/2022, 11:06 AM

## 2022-09-09 NOTE — ED Triage Notes (Addendum)
Patient BIB [facility] from home. Feeling weak over the last day. Trouble getting out of bed this morning. EMS stated patients oxygen was 88% room air and was placed on 2L [address] improved to 95%. Had a cough last week. Left arm pain from shingles years ago.

## 2022-09-09 NOTE — ED Notes (Signed)
Pt BR needs addressed. Pt assisted with the use of a urinal. Family '@bedside'$ . Pt resting comfortably in stretcher. Side Rails up times 2. Call light within reach. Pt has no further needs at this time.

## 2022-09-09 NOTE — ED Provider Notes (Signed)
Stephen Nunez AT [facility] Provider Note   CSN: [id number] Arrival date & time: 09/09/22  [id number]     History  Chief Complaint  Patient presents with   Weakness    Stephen Nunez is a 89 y.o. male w/ hx of aortic stenosis, HLD, HTN, DVT on eliquis, presenting to ED with generalized weakness beginning this morning.  The patient reports that he was in his usual state of health yesterday and was out shopping with his wife.  He felt fine.  This morning when he woke up he said he felt "wobbly and I could not get out of bed".  The patient reports that he is feeling weak.  He had reported to EMS a cough for the past week but denied that he had coughing to me.  He denied fever or chills.  He reports he has chronic left chest wall and left arm pain from postherpetic neuralgia, but no new chest pressure.  EMS reports the patient was 80% on room air when they arrived and improved on 2 L nasal cannula to 95%.  The patient reports compliance with all of his home medications.  HPI     Home Medications Prior to Admission medications   Medication Sig Start Date End Date Taking? Authorizing Provider  acetaminophen (TYLENOL) 650 MG CR tablet Take 1,300 mg by mouth every 8 (eight) hours as needed for pain.    [provider]  amLODipine (NORVASC) 10 MG tablet Take 10 mg by mouth daily. 02/20/22   [provider]  aspirin 81 MG chewable tablet CHEW 1 TABLET (81 MG TOTAL) BY MOUTH 2 (TWO) TIMES DAILY FOR 28 DAYS. FOR PREVENTION OF BLOOD CLOT. MAY RESUME NORMAL DOSE OF 81 MG EVERY OTHER DAY AFTER THIS PRESCRIPTION.    [provider]  Bioflavonoid Products (GRAPE SEED PO) Take 2 capsules by mouth every other day. [place] Grape Capsules    [provider]  Cholecalciferol (VITAMIN D3) 2000 units TABS Take 2,000 Units by mouth every other day.    [provider]  Coenzyme Q10 (CO Q-10) 200 MG CAPS Take 200 mg by mouth every other day.     [provider]  Cyanocobalamin (VITAMIN B-12) 5000 MCG LOZG Take 5,000 mcg by mouth every other day.    [provider]  docusate sodium (COLACE) 100 MG capsule Take 1 capsule (100 mg total) by mouth 2 (two) times daily. 07/03/21   Irving Copas, PA-C  Glucosamine HCl 1500 MG TABS Take 1,500 mg by mouth every other day.    [provider]  HYDROcodone-acetaminophen (NORCO/VICODIN) 5-325 MG tablet Take 1 tablet by mouth every 4 (four) hours as needed for severe pain. Do not take with Tramadol. May take tramadol instead if you have minimal pain. 07/03/21   Irving Copas, PA-C  latanoprost (XALATAN) 0.005 % ophthalmic solution 1 drop at bedtime. 03/29/22   [provider]  lidocaine (LMX) 4 % cream Apply 1 Application topically 3 (three) times daily as needed. 04/09/22   [provider]  lisinopril (ZESTRIL) 40 MG tablet Take 40 mg by mouth daily. 03/16/22   [provider]  methocarbamol (ROBAXIN) 500 MG tablet Take 1 tablet (500 mg total) by mouth every 6 (six) hours as needed for muscle spasms. 07/03/21   Irving Copas, PA-C  Multiple Vitamin (MULTIVITAMIN WITH MINERALS) TABS tablet Take 1 tablet by mouth every other day.    [provider]  OVER THE COUNTER  MEDICATION Apply 1 application topically 4 (four) times daily as needed (for back pain due to shingles.). CBD CREAM (HEMP)    [provider]  Polyethyl Glycol-Propyl Glycol (SYSTANE OP) Place 1 drop into both eyes 2 (two) times daily as needed (dry eyes).    [provider]  polyethylene glycol (MIRALAX / GLYCOLAX) 17 g packet Take 17 g by mouth daily as needed for mild constipation. 07/03/21   Irving Copas, PA-C  quinapril (ACCUPRIL) 40 MG tablet Take 40 mg by mouth daily.    [provider]  rosuvastatin (CRESTOR) 20 MG tablet Take 1 tablet (20 mg total) by mouth daily. 05/20/22   Fay Records, MD  vitamin E 400 UNIT capsule Take 400 Units by  mouth every other day.    [provider]      Allergies    Patient has no known allergies.    Review of Systems   Review of Systems  Physical Exam Updated Vital Signs BP 129/60   Pulse 70   Temp 98.4 F (36.9 C) (Oral)   Resp 18   Ht '5\' 9"'$  (1.753 m)   Wt 75 kg   SpO2 (!) 89%   BMI 24.42 kg/m  Physical Exam Constitutional:      General: He is not in acute distress. HENT:     Head: Normocephalic and atraumatic.  Eyes:     Conjunctiva/sclera: Conjunctivae normal.     Pupils: Pupils are equal, round, and reactive to light.  Cardiovascular:     Rate and Rhythm: Normal rate and regular rhythm.     Heart sounds: Murmur heard.  Pulmonary:     Effort: Pulmonary effort is normal. No respiratory distress.     Comments: 91-93% on room air at rest Abdominal:     General: There is no distension.     Tenderness: There is no abdominal tenderness.  Musculoskeletal:        General: No swelling.     Right lower leg: No edema.     Left lower leg: No edema.  Skin:    General: Skin is warm and dry.  Neurological:     General: No focal deficit present.     Mental Status: He is alert. Mental status is at baseline.  Psychiatric:        Mood and Affect: Mood normal.        Behavior: Behavior normal.     ED Results / Procedures / Treatments   Labs (all labs ordered are listed, but only abnormal results are displayed) Labs Reviewed  COMPREHENSIVE METABOLIC PANEL - Abnormal; Notable for the following components:      Result Value   Potassium 3.4 (*)    Glucose, Bld 104 (*)    Calcium 8.1 (*)    Total Protein 6.3 (*)    Albumin 3.2 (*)    GFR, Estimated 57 (*)    All other components within normal limits  CBC WITH DIFFERENTIAL/PLATELET - Abnormal; Notable for the following components:   RBC 3.12 (*)    Hemoglobin 9.5 (*)    HCT 30.4 (*)    Neutro Abs 8.5 (*)    Monocytes Absolute 1.1 (*)    Abs Immature Granulocytes 0.08 (*)    All other components within normal  limits  BRAIN NATRIURETIC PEPTIDE - Abnormal; Notable for the following components:   B Natriuretic Peptide 266.6 (*)    All other components within normal limits  URINALYSIS, ROUTINE W REFLEX MICROSCOPIC -  Abnormal; Notable for the following components:   Color, Urine STRAW (*)    Hgb urine dipstick MODERATE (*)    Bacteria, UA RARE (*)    All other components within normal limits  TROPONIN I (HIGH SENSITIVITY) - Abnormal; Notable for the following components:   Troponin I (High Sensitivity) 82 (*)    All other components within normal limits  TROPONIN I (HIGH SENSITIVITY) - Abnormal; Notable for the following components:   Troponin I (High Sensitivity) 338 (*)    All other components within normal limits  RESP PANEL BY RT-PCR (RSV, FLU A&B, COVID)  RVPGX2  RESPIRATORY PANEL BY PCR  EXPECTORATED SPUTUM ASSESSMENT W GRAM STAIN, RFLX TO RESP C  CULTURE, BLOOD (SINGLE)  CULTURE, RESPIRATORY W GRAM STAIN  EXPECTORATED SPUTUM ASSESSMENT W GRAM STAIN, RFLX TO RESP C  TSH  T4, FREE  LACTIC ACID, PLASMA  STREP PNEUMONIAE URINARY ANTIGEN  LEGIONELLA PNEUMOPHILA SEROGP 1 UR AG    EKG None  Radiology DG Chest 2 View  Result Date: 09/09/2022 CLINICAL DATA:  Pneumonia EXAM: CHEST - 2 VIEW COMPARISON:  10/30/2009 FINDINGS: Enlargement of cardiac silhouette. Mediastinal contours and pulmonary vascularity normal. Retrocardiac LEFT lower lobe infiltrate consistent with pneumonia. Minimal atelectasis RIGHT base. Remaining lungs clear. No pleural effusion or pneumothorax. Osseous demineralization with degenerative changes thoracic spine. IMPRESSION: LEFT lower lobe infiltrate consistent with pneumonia. Subsegmental atelectasis RIGHT base. Electronically Signed   By: Lavonia Dana M.D.   On: 09/09/2022 09:39    Procedures Procedures    Medications Ordered in ED Medications  aspirin chewable tablet 81 mg (has no administration in time range)  amLODipine (NORVASC) tablet 10 mg (has no  administration in time range)  rosuvastatin (CRESTOR) tablet 20 mg (has no administration in time range)  latanoprost (XALATAN) 0.005 % ophthalmic solution 1 drop (has no administration in time range)  lidocaine (LMX) 4 % cream 1 Application (has no administration in time range)  methocarbamol (ROBAXIN) tablet 500 mg (has no administration in time range)  polyvinyl alcohol (LIQUIFILM TEARS) 1.4 % ophthalmic solution 1 drop (has no administration in time range)  docusate sodium (COLACE) capsule 100 mg (has no administration in time range)  polyethylene glycol (MIRALAX / GLYCOLAX) packet 17 g (has no administration in time range)  Vitamin B-12 LOZG 5,000 mcg (has no administration in time range)  enoxaparin (LOVENOX) injection 40 mg (40 mg Subcutaneous Given 09/09/22 1402)  cefTRIAXone (ROCEPHIN) 2 g in sodium chloride 0.9 % 100 mL IVPB (has no administration in time range)  azithromycin (ZITHROMAX) 500 mg in sodium chloride 0.9 % 250 mL IVPB (0 mg Intravenous Stopped 09/09/22 1605)  acetaminophen (TYLENOL) tablet 650 mg (650 mg Oral Given 09/09/22 1000)  cefTRIAXone (ROCEPHIN) 1 g in sodium chloride 0.9 % 100 mL IVPB (1 g Intravenous New Bag/Given 09/09/22 1012)  doxycycline (VIBRAMYCIN) 100 mg in sodium chloride 0.9 % 250 mL IVPB (0 mg Intravenous Stopped 09/09/22 1500)  sodium chloride 0.9 % bolus 1,000 mL (1,000 mLs Intravenous New Bag/Given 09/09/22 1011)    ED Course/ Medical Decision Making/ A&P                             Medical Decision Making Amount and/or Complexity of Data Reviewed Labs: ordered. Radiology: ordered.  Risk OTC drugs. Decision regarding hospitalization.   This patient presents to the ED with concern for weakness. This involves an extensive number of treatment options, and is a complaint that  carries with it a high risk of complications and morbidity.  The differential diagnosis includes infection vs anemia vs atypical ACS vs viral illness vs other  Co-morbidities  that complicate the patient evaluation:   Additional history obtained from EMS  External records from outside source obtained and reviewed including echo from Nov 2023 showing thickening of the aortic valve with moderate to severe aortic stenosis, with cardiologist Dr Dorris Carnes recommended continued observational care.  EF was 65 to 70%.  I ordered and personally interpreted labs.  The pertinent results include: White blood cell count 10.4.  Lactate within normal limits.  COVID and flu are negative.  Very minor elevation of troponin, with repeat troponin pending.  I ordered imaging studies including x-ray of the chest I independently visualized and interpreted imaging which showed left lower lobe infiltrate consistent with pneumonia I agree with the radiologist interpretation  The patient was maintained on a cardiac monitor.  I personally viewed and interpreted the cardiac monitored which showed an underlying rhythm of: Sinus rhythm and sinus tachycardia  Per my interpretation the patient's ECG shows no acute ischemic findings  I ordered medication including IV Rocephin, doxycycline for community pneumonia.  Fluid bolus  I have reviewed the patients home medicines and have made adjustments as needed  Test Considered: Doubt acute PE clinically and I do not see indication for CT angiogram   After the interventions noted above, I reevaluated the patient and found that they have: stayed the same   Dispostion:  After consideration of the diagnostic results and the patients response to treatment, I feel that the patent would benefit from medical admission for community pneumonia, given his generalized weakness.        Final Clinical Impression(s) / ED Diagnoses Final diagnoses:  Pneumonia due to infectious organism, unspecified laterality, unspecified part of lung    Rx / DC Orders ED Discharge Orders     None         Emry Tobin, Carola Rhine, MD 09/09/22 [phone]

## 2022-09-09 NOTE — ED Notes (Signed)
ED TO INPATIENT HANDOFF REPORT  ED Nurse Name and Phone #: Izola Price [phone]  S Name/Age/Gender Stephen Nunez 89 y.o. male Room/Bed: WA12/WA12  Code Status   Code Status: DNR  Home/SNF/Other Home Patient oriented to: self, place, time, and situation Is this baseline? Yes   Triage Complete: Triage complete  Chief Complaint CAP (community acquired pneumonia) [J18.9]  Triage Note Patient BIB [facility] from home. Feeling weak over the last day. Trouble getting out of bed this morning. EMS stated patients oxygen was 88% room air and was placed on 2L [place] improved to 95%. Had a cough last week. Left arm pain from shingles years ago.    Allergies No Known Allergies  Level of Care/Admitting Diagnosis ED Disposition     ED Disposition  Admit   Condition  --   Comment  Hospital Area: [facility] [[id number]]  Level of Care: Telemetry [5]  Admit to tele based on following criteria: Complex arrhythmia (Bradycardia/Tachycardia)  May admit patient to [facility] or [facility] if equivalent level of care is available:: No  Covid Evaluation: Confirmed COVID Negative  Diagnosis: CAP (community acquired pneumonia) [id number]  Admitting Physician: Nita Sells [id number]  Attending Physician: Nita Sells [id number]  Certification:: I certify this patient will need inpatient services for at least 2 midnights  Estimated Length of Stay: 4          B Medical/Surgery History Past Medical History:  Diagnosis Date   Aortic stenosis    Arthritis    Bell's palsy    mild, left   Heart murmur    History of shingles    2000   Hyperlipidemia    Hypertension    OSA (obstructive sleep apnea)    Pneumonia    as an infant   Post herpetic neuralgia    Prostate cancer    prostate cancer   Tremor    Past Surgical History:  Procedure Laterality Date   COLONOSCOPY     HERNIA REPAIR     INGUINAL HERNIA REPAIR Right 09/07/2017   Procedure: OPEN REPAIR RIGHT  INGUINAL HERNIA;  Surgeon: Armandina Gemma, MD;  Location: [facility];  Service: General;  Laterality: Right;   INSERTION OF MESH Right 09/07/2017   Procedure: INSERTION OF MESH;  Surgeon: Armandina Gemma, MD;  Location: [facility];  Service: General;  Laterality: Right;   PROSTATE SURGERY     SPINAL CORD STIMULATOR INSERTION N/A 04/26/2015   Procedure: LUMBAR SPINAL CORD STIMULATOR INSERTION;  Surgeon: Clydell Hakim, MD;  Location: [facility];  Service: Neurosurgery;  Laterality: N/A;   SPINAL CORD STIMULATOR REMOVAL N/A 05/15/2016   Procedure: THORACIC SPINAL CORD STIMULATOR REMOVAL;  Surgeon: Clydell Hakim, MD;  Location: [facility];  Service: Neurosurgery;  Laterality: N/A;  THORACIC SPINAL CORD STIMULATOR REMOVAL   TONSILLECTOMY     TOTAL KNEE ARTHROPLASTY Left 07/01/2021   Procedure: TOTAL KNEE ARTHROPLASTY;  Surgeon: Paralee Cancel, MD;  Location: [facility];  Service: Orthopedics;  Laterality: Left;     A IV Location/Drains/Wounds Patient Lines/Drains/Airways Status     Active Line/Drains/Airways     Name Placement date Placement time Site Days   Peripheral IV 09/09/22 20 G Anterior;Right Forearm 09/09/22  0854  Forearm  less than 1   Incision (Closed) 07/01/21 Knee Left 07/01/21  1055  -- 435            Intake/Output Last 24 hours No intake or output data in the 24 hours ending 09/09/22 1548  Labs/Imaging Results for orders placed or performed during the hospital encounter of 09/09/22 (from the past 48 hour(s))  Comprehensive metabolic panel     Status: Abnormal   Collection Time: 09/09/22  9:22 AM  Result Value Ref Range   Sodium 141 135 - 145 mmol/L   Potassium 3.4 (L) 3.5 - 5.1 mmol/L   Chloride 107 98 - 111 mmol/L   CO2 25 22 - 32 mmol/L   Glucose, Bld 104 (H) 70 - 99 mg/dL    Comment: Glucose reference range applies only to samples taken after fasting for at least 8 hours.   BUN 23 8 - 23 mg/dL   Creatinine, Ser 1.22 0.61 - 1.24 mg/dL   Calcium 8.1 (L) 8.9 - 10.3 mg/dL   Total  Protein 6.3 (L) 6.5 - 8.1 g/dL   Albumin 3.2 (L) 3.5 - 5.0 g/dL   AST 22 15 - 41 U/L   ALT 10 0 - 44 U/L   Alkaline Phosphatase 56 38 - 126 U/L   Total Bilirubin 0.7 0.3 - 1.2 mg/dL   GFR, Estimated 57 (L) >60 mL/min    Comment: (NOTE) Calculated using the CKD-EPI Creatinine Equation (2021)    Anion gap 9 5 - 15    Comment: Performed at [facility], [place] [address], [place], [place] [address]  CBC with Differential     Status: Abnormal   Collection Time: 09/09/22  9:22 AM  Result Value Ref Range   WBC 10.4 4.0 - 10.5 K/uL   RBC 3.12 (L) 4.22 - 5.81 MIL/uL   Hemoglobin 9.5 (L) 13.0 - 17.0 g/dL   HCT 30.4 (L) 39.0 - 52.0 %   MCV 97.4 80.0 - 100.0 fL   MCH 30.4 26.0 - 34.0 pg   MCHC 31.3 30.0 - 36.0 g/dL   RDW 13.2 11.5 - 15.5 %   Platelets 224 150 - 400 K/uL   nRBC 0.0 0.0 - 0.2 %   Neutrophils Relative % 81 %   Neutro Abs 8.5 (H) 1.7 - 7.7 K/uL   Lymphocytes Relative 7 %   Lymphs Abs 0.7 0.7 - 4.0 K/uL   Monocytes Relative 10 %   Monocytes Absolute 1.1 (H) 0.1 - 1.0 K/uL   Eosinophils Relative 1 %   Eosinophils Absolute 0.1 0.0 - 0.5 K/uL   Basophils Relative 0 %   Basophils Absolute 0.0 0.0 - 0.1 K/uL   Immature Granulocytes 1 %   Abs Immature Granulocytes 0.08 (H) 0.00 - 0.07 K/uL    Comment: Performed at [facility], [place] [address] [place], [place] [address]  Troponin I (High Sensitivity)     Status: Abnormal   Collection Time: 09/09/22  9:22 AM  Result Value Ref Range   Troponin I (High Sensitivity) 82 (H) <18 ng/L    Comment: (NOTE) Elevated high sensitivity troponin I (hsTnI) values and significant  changes across serial measurements may suggest ACS but many other  chronic and acute conditions are known to elevate hsTnI results.  Refer to the "Links" section for chest pain algorithms and additional  guidance. Performed at [facility], [place] [address], [place], [place] [address]   Brain natriuretic  peptide     Status: Abnormal   Collection Time: 09/09/22  9:22 AM  Result Value Ref Range   B Natriuretic Peptide 266.6 (H) 0.0 - 100.0 pg/mL    Comment: Performed at [facility], [place] [address] [place], [place] [address]  Resp panel  by RT-PCR (RSV, Flu A&B, Covid)     Status: None   Collection Time: 09/09/22  9:22 AM   Specimen: Nasal Swab  Result Value Ref Range   SARS Coronavirus 2 by RT PCR NEGATIVE NEGATIVE    Comment: (NOTE) SARS-CoV-2 target nucleic acids are NOT DETECTED.  The SARS-CoV-2 RNA is generally detectable in upper respiratory specimens during the acute phase of infection. The lowest concentration of SARS-CoV-2 viral copies this assay can detect is 138 copies/mL. A negative result does not preclude SARS-Cov-2 infection and should not be used as the sole basis for treatment or other patient management decisions. A negative result may occur with  improper specimen collection/handling, submission of specimen other than nasopharyngeal swab, presence of viral mutation(s) within the areas targeted by this assay, and inadequate number of viral copies(<138 copies/mL). A negative result must be combined with clinical observations, patient history, and epidemiological information. The expected result is Negative.  Fact Sheet for Patients:  EntrepreneurPulse.com.au  Fact Sheet for Healthcare Providers:  IncredibleEmployment.be  This test is no t yet approved or cleared by the [place] FDA and  has been authorized for detection and/or diagnosis of SARS-CoV-2 by FDA under an Emergency Use Authorization (EUA). This EUA will remain  in effect (meaning this test can be used) for the duration of the COVID-19 declaration under Section 564(b)(1) of the Act, 21 U.S.C.section 360bbb-3(b)(1), unless the authorization is terminated  or revoked sooner.       Influenza A by PCR NEGATIVE NEGATIVE   Influenza B by PCR  NEGATIVE NEGATIVE    Comment: (NOTE) The Xpert Xpress SARS-CoV-2/FLU/RSV plus assay is intended as an aid in the diagnosis of influenza from Nasopharyngeal swab specimens and should not be used as a sole basis for treatment. Nasal washings and aspirates are unacceptable for Xpert Xpress SARS-CoV-2/FLU/RSV testing.  Fact Sheet for Patients: EntrepreneurPulse.com.au  Fact Sheet for Healthcare Providers: IncredibleEmployment.be  This test is not yet approved or cleared by the [place] FDA and has been authorized for detection and/or diagnosis of SARS-CoV-2 by FDA under an Emergency Use Authorization (EUA). This EUA will remain in effect (meaning this test can be used) for the duration of the COVID-19 declaration under Section 564(b)(1) of the Act, 21 U.S.C. section 360bbb-3(b)(1), unless the authorization is terminated or revoked.     Resp Syncytial Virus by PCR NEGATIVE NEGATIVE    Comment: (NOTE) Fact Sheet for Patients: EntrepreneurPulse.com.au  Fact Sheet for Healthcare Providers: IncredibleEmployment.be  This test is not yet approved or cleared by the [place] FDA and has been authorized for detection and/or diagnosis of SARS-CoV-2 by FDA under an Emergency Use Authorization (EUA). This EUA will remain in effect (meaning this test can be used) for the duration of the COVID-19 declaration under Section 564(b)(1) of the Act, 21 U.S.C. section 360bbb-3(b)(1), unless the authorization is terminated or revoked.  Performed at [facility], [place] [address], [place], [place] [address]   TSH     Status: None   Collection Time: 09/09/22  9:22 AM  Result Value Ref Range   TSH 2.718 0.350 - 4.500 uIU/mL    Comment: Performed by a 3rd Generation assay with a functional sensitivity of <=0.01 uIU/mL. Performed at [facility], [place] [address], [place], [place]  [address]   T4, free     Status: None   Collection Time: 09/09/22  9:22 AM  Result Value Ref Range   Free T4 0.93 0.61 - 1.12 ng/dL  Comment: (NOTE) Biotin ingestion may interfere with free T4 tests. If the results are inconsistent with the TSH level, previous test results, or the clinical presentation, then consider biotin interference. If needed, order repeat testing after stopping biotin. Performed at [facility], [place] [address] [place], [place] [address]   Lactic acid, plasma     Status: None   Collection Time: 09/09/22  9:22 AM  Result Value Ref Range   Lactic Acid, Venous 1.2 0.5 - 1.9 mmol/L    Comment: Performed at [facility], [place] [address], [place], [place] [address]  Urinalysis, Routine w reflex microscopic -Urine, Clean Catch     Status: Abnormal   Collection Time: 09/09/22 10:29 AM  Result Value Ref Range   Color, Urine STRAW (A) YELLOW   APPearance CLEAR CLEAR   Specific Gravity, Urine 1.010 1.005 - 1.030   pH 5.0 5.0 - 8.0   Glucose, UA NEGATIVE NEGATIVE mg/dL   Hgb urine dipstick MODERATE (A) NEGATIVE   Bilirubin Urine NEGATIVE NEGATIVE   Ketones, ur NEGATIVE NEGATIVE mg/dL   Protein, ur NEGATIVE NEGATIVE mg/dL   Nitrite NEGATIVE NEGATIVE   Leukocytes,Ua NEGATIVE NEGATIVE   RBC / HPF 0-5 0 - 5 RBC/hpf   WBC, UA 0-5 0 - 5 WBC/hpf   Bacteria, UA RARE (A) NONE SEEN   Squamous Epithelial / HPF 0-5 0 - 5 /HPF   Mucus PRESENT     Comment: Performed at [facility], [place] [address] [place], [place] [address]  Respiratory (~20 pathogens) panel by PCR     Status: None   Collection Time: 09/09/22 12:06 PM   Specimen: Nasopharyngeal Swab; Respiratory  Result Value Ref Range   Adenovirus NOT DETECTED NOT DETECTED   Coronavirus 229E NOT DETECTED NOT DETECTED    Comment: (NOTE) The Coronavirus on the Respiratory Panel, DOES NOT test for the novel  Coronavirus (2019 nCoV)    Coronavirus HKU1 NOT DETECTED NOT DETECTED    Coronavirus NL63 NOT DETECTED NOT DETECTED   Coronavirus OC43 NOT DETECTED NOT DETECTED   Metapneumovirus NOT DETECTED NOT DETECTED   Rhinovirus / Enterovirus NOT DETECTED NOT DETECTED   Influenza A NOT DETECTED NOT DETECTED   Influenza B NOT DETECTED NOT DETECTED   Parainfluenza Virus 1 NOT DETECTED NOT DETECTED   Parainfluenza Virus 2 NOT DETECTED NOT DETECTED   Parainfluenza Virus 3 NOT DETECTED NOT DETECTED   Parainfluenza Virus 4 NOT DETECTED NOT DETECTED   Respiratory Syncytial Virus NOT DETECTED NOT DETECTED   Bordetella pertussis NOT DETECTED NOT DETECTED   Bordetella Parapertussis NOT DETECTED NOT DETECTED   Chlamydophila pneumoniae NOT DETECTED NOT DETECTED   Mycoplasma pneumoniae NOT DETECTED NOT DETECTED    Comment: Performed at [facility], [place] [address] [place], [place] [address]  Expectorated Sputum Assessment w Gram Stain, Rflx to Resp Cult     Status: None   Collection Time: 09/09/22 12:06 PM   Specimen: Expectorated Sputum  Result Value Ref Range   Specimen Description EXPECTORATED SPUTUM    Special Requests NONE    Sputum evaluation      Sputum specimen not acceptable for testing.  Please recollect.   Performed at [facility], [place] [address], [place], [place] [address]    Report Status 09/09/2022 FINAL   Strep pneumoniae urinary antigen     Status: None   Collection Time: 09/09/22 12:06 PM  Result Value Ref Range   Strep Pneumo Urinary Antigen NEGATIVE NEGATIVE  Comment:        Infection due to S. pneumoniae cannot be absolutely ruled out since the antigen present may be below the detection limit of the test. Performed at [facility], [address] [place], [place] [address]   Troponin I (High Sensitivity)     Status: Abnormal   Collection Time: 09/09/22 12:09 PM  Result Value Ref Range   Troponin I (High Sensitivity) 338 (HH) <18 ng/L    Comment: CRITICAL RESULT CALLED TO, READ BACK BY AND VERIFIED WITH RN A  WILSON AT 1257 09/09/22 CRUICKSHANK A DELTA CHECK NOTED (NOTE) Elevated high sensitivity troponin I (hsTnI) values and significant  changes across serial measurements may suggest ACS but many other  chronic and acute conditions are known to elevate hsTnI results.  Refer to the "Links" section for chest pain algorithms and additional  guidance. Performed at [facility], [place] [address] [place], [place] [address]    DG Chest 2 View  Result Date: 09/09/2022 CLINICAL DATA:  Pneumonia EXAM: CHEST - 2 VIEW COMPARISON:  10/30/2009 FINDINGS: Enlargement of cardiac silhouette. Mediastinal contours and pulmonary vascularity normal. Retrocardiac LEFT lower lobe infiltrate consistent with pneumonia. Minimal atelectasis RIGHT base. Remaining lungs clear. No pleural effusion or pneumothorax. Osseous demineralization with degenerative changes thoracic spine. IMPRESSION: LEFT lower lobe infiltrate consistent with pneumonia. Subsegmental atelectasis RIGHT base. Electronically Signed   By: Lavonia Dana M.D.   On: 09/09/2022 09:39    Pending Labs Unresulted Labs (From admission, onward)     Start     Ordered   09/10/22 0500  Comprehensive metabolic panel  Tomorrow morning,   R        09/09/22 1138   09/10/22 0500  CBC  Tomorrow morning,   R        09/09/22 1138   09/09/22 1138  Expectorated Sputum Assessment w Gram Stain, Rflx to Resp Cult  (COPD / Pneumonia / Cellulitis / Lower Extremity Wound)  ONCE - URGENT,   URGENT        09/09/22 1138   09/09/22 1137  Legionella Pneumophila Serogp 1 Ur Ag  (COPD / Pneumonia / Cellulitis / Lower Extremity Wound)  Once,   R        09/09/22 1138   09/09/22 1104  Culture, Respiratory w Gram Stain  ONCE - URGENT,   URGENT       Question:  Patient immune status  Answer:  Immunocompromised   09/09/22 1103   09/09/22 0903  Culture, blood (single)  Once,   STAT        09/09/22 0902            Vitals/Pain Today's Vitals   09/09/22 0850 09/09/22  0854 09/09/22 1246 09/09/22 1310  BP:  (!) 146/100    Pulse:  96    Resp:  19    Temp:  (!) 100.9 F (38.3 C) 98.4 F (36.9 C)   TempSrc:  Oral Oral   SpO2:  97%    Weight: 75 kg     Height: '5\' 9"'$  (1.753 m)     PainSc: 5    4     Isolation Precautions Droplet precaution  Medications Medications  aspirin chewable tablet 81 mg (has no administration in time range)  amLODipine (NORVASC) tablet 10 mg (has no administration in time range)  rosuvastatin (CRESTOR) tablet 20 mg (has no administration in time range)  latanoprost (XALATAN) 0.005 % ophthalmic solution 1 drop (has no administration in  time range)  lidocaine (LMX) 4 % cream 1 Application (has no administration in time range)  methocarbamol (ROBAXIN) tablet 500 mg (has no administration in time range)  polyethylene glycol 0.4% and propylene glycol 0.3% (SYSTANE) ophthalmic gel (has no administration in time range)  docusate sodium (COLACE) capsule 100 mg (has no administration in time range)  polyethylene glycol (MIRALAX / GLYCOLAX) packet 17 g (has no administration in time range)  Vitamin B-12 LOZG 5,000 mcg (has no administration in time range)  enoxaparin (LOVENOX) injection 40 mg (40 mg Subcutaneous Given 09/09/22 1402)  cefTRIAXone (ROCEPHIN) 2 g in sodium chloride 0.9 % 100 mL IVPB (has no administration in time range)  azithromycin (ZITHROMAX) 500 mg in sodium chloride 0.9 % 250 mL IVPB (500 mg Intravenous New Bag/Given 09/09/22 1406)  acetaminophen (TYLENOL) tablet 650 mg (650 mg Oral Given 09/09/22 1000)  cefTRIAXone (ROCEPHIN) 1 g in sodium chloride 0.9 % 100 mL IVPB (1 g Intravenous New Bag/Given 09/09/22 1012)  doxycycline (VIBRAMYCIN) 100 mg in sodium chloride 0.9 % 250 mL IVPB (0 mg Intravenous Stopped 09/09/22 1500)  sodium chloride 0.9 % bolus 1,000 mL (1,000 mLs Intravenous New Bag/Given 09/09/22 1011)    Mobility walks with device     Focused Assessments N/A   R Recommendations: See Admitting Provider  Note  Report given to:   Additional Notes:  N/A

## 2022-09-10 ENCOUNTER — Inpatient Hospital Stay (HOSPITAL_COMMUNITY): Payer: PPO

## 2022-09-10 DIAGNOSIS — R079 Chest pain, unspecified: Secondary | ICD-10-CM | POA: Diagnosis not present

## 2022-09-10 DIAGNOSIS — R7989 Other specified abnormal findings of blood chemistry: Secondary | ICD-10-CM | POA: Diagnosis not present

## 2022-09-10 DIAGNOSIS — J189 Pneumonia, unspecified organism: Secondary | ICD-10-CM | POA: Diagnosis not present

## 2022-09-10 LAB — BLOOD CULTURE ID PANEL (REFLEXED) - BCID2

## 2022-09-10 LAB — COMPREHENSIVE METABOLIC PANEL
ALT: 11 U/L (ref 0–44)
AST: 23 U/L (ref 15–41)
Albumin: 3.2 g/dL — ABNORMAL LOW (ref 3.5–5.0)
Alkaline Phosphatase: 51 U/L (ref 38–126)
Anion gap: 11 (ref 5–15)
BUN: 24 mg/dL — ABNORMAL HIGH (ref 8–23)
CO2: 26 mmol/L (ref 22–32)
Calcium: 8.7 mg/dL — ABNORMAL LOW (ref 8.9–10.3)
Chloride: 104 mmol/L (ref 98–111)
Creatinine, Ser: 1.45 mg/dL — ABNORMAL HIGH (ref 0.61–1.24)
GFR, Estimated: 46 mL/min — ABNORMAL LOW (ref >60)
Glucose, Bld: 116 mg/dL — ABNORMAL HIGH (ref 70–99)
Potassium: 3.8 mmol/L (ref 3.5–5.1)
Sodium: 141 mmol/L (ref 135–145)
Total Bilirubin: 0.9 mg/dL (ref 0.3–1.2)
Total Protein: 6.1 g/dL — ABNORMAL LOW (ref 6.5–8.1)

## 2022-09-10 LAB — ECHOCARDIOGRAM COMPLETE
AR max vel: 1.38 cm2
AV Area VTI: 1.22 cm2
AV Area mean vel: 1.35 cm2
AV Mean grad: 28 mmHg
AV Peak grad: 46.1 mmHg
Ao pk vel: 3.4 m/s
Area-P 1/2: 2.91 cm2
Est EF: 55
Height: 71 in
P 1/2 time: 404 msec
S' Lateral: 3.4 cm
Weight: 2599.66 oz

## 2022-09-10 LAB — CBC
HCT: 31.5 % — ABNORMAL LOW (ref 39.0–52.0)
Hemoglobin: 9.9 g/dL — ABNORMAL LOW (ref 13.0–17.0)
MCH: 30.4 pg (ref 26.0–34.0)
MCHC: 31.4 g/dL (ref 30.0–36.0)
MCV: 96.6 fL (ref 80.0–100.0)
Platelets: 206 10*3/uL (ref 150–400)
RBC: 3.26 MIL/uL — ABNORMAL LOW (ref 4.22–5.81)
RDW: 13.2 % (ref 11.5–15.5)
WBC: 13.1 10*3/uL — ABNORMAL HIGH (ref 4.0–10.5)
nRBC: 0 % (ref 0.0–0.2)

## 2022-09-10 MED ORDER — SODIUM CHLORIDE 0.9 % IV SOLN: INTRAVENOUS | Status: DC

## 2022-09-10 NOTE — Evaluation (Signed)
Clinical/Bedside Swallow Evaluation Patient Details  Name: ARMONI GALANTE MRN: [id number] Date of Birth: 03-06-1932  Today's Date: 09/10/2022 Time: SLP Start Time (ACUTE ONLY): 1059 SLP Stop Time (ACUTE ONLY): [id number] SLP Time Calculation (min) (ACUTE ONLY): 38 min  Past Medical History:  Past Medical History:  Diagnosis Date   Aortic stenosis    Arthritis    Bell's palsy    mild, left   Heart murmur    History of shingles    2000   Hyperlipidemia    Hypertension    OSA (obstructive sleep apnea)    Pneumonia    as an infant   Post herpetic neuralgia    Prostate cancer    prostate cancer   Tremor    Past Surgical History:  Past Surgical History:  Procedure Laterality Date   COLONOSCOPY     HERNIA REPAIR     INGUINAL HERNIA REPAIR Right 09/07/2017   Procedure: OPEN REPAIR RIGHT INGUINAL HERNIA;  Surgeon: Armandina Gemma, MD;  Location: [facility];  Service: General;  Laterality: Right;   INSERTION OF MESH Right 09/07/2017   Procedure: INSERTION OF MESH;  Surgeon: Armandina Gemma, MD;  Location: [facility];  Service: General;  Laterality: Right;   PROSTATE SURGERY     SPINAL CORD STIMULATOR INSERTION N/A 04/26/2015   Procedure: LUMBAR SPINAL CORD STIMULATOR INSERTION;  Surgeon: Clydell Hakim, MD;  Location: [facility];  Service: Neurosurgery;  Laterality: N/A;   SPINAL CORD STIMULATOR REMOVAL N/A 05/15/2016   Procedure: THORACIC SPINAL CORD STIMULATOR REMOVAL;  Surgeon: Clydell Hakim, MD;  Location: [facility];  Service: Neurosurgery;  Laterality: N/A;  THORACIC SPINAL CORD STIMULATOR REMOVAL   TONSILLECTOMY     TOTAL KNEE ARTHROPLASTY Left 07/01/2021   Procedure: TOTAL KNEE ARTHROPLASTY;  Surgeon: Paralee Cancel, MD;  Location: [facility];  Service: Orthopedics;  Laterality: Left;   HPI:  89 yo male adm to [facility] 3/13 with lethargy, weakness and prior cough *wife reports no cough in the last few days. Pt also with left chest wall and left arm pain from postherpetic neuralgia 2015, prostate cancer, pna as  a child.  CXR showed LEFT lower lobe infiltrate consistent with pneumonia. Swallow eval ordered.    Assessment / Plan / Recommendation  Clinical Impression  Pt presents with upper and lower motor neuron facial deficits on left *? consistent with Bell's Palsy- which he denies.  Trigeminal nerve intact. Otherwise no focal CN deficits.  He has just finished breakfast therefore only oberved consuming approx 1/2 container applesauce, 4 graham crackers, soda and water.  Mastication adequate with timely swallow noted and no oral pocketing.  He did not pass 3 ounce Yale swallow challenge as he overtly coughed approximately 2.5 ounces into swallowing-- However chin extension observed which may open airway.  NO clinical indication of aspiration with small sips.  Pt endorses occasional issues with sensing food more than liquids lodging in throat - pointing to vallecular region - requiring him to cough up and re-swallow.  His wife confirms that if he eats slowly amd masticates well, dysphagia symptoms are prevented.  NO weight loss, nor recurrent pulmonary infections noted and pt denies reflux symptoms.  Frequent throat clearing noted before and during intake- pt also with mucus reported due to his respiratory issue.  At this time, pt's aspiration risk appears low based on clinical swallow evaluation.  Given pt's advanced age and occasional issue with solids, provided wife with information re: antichoking apparatus called "Life Vac".  Advised pt maintain exercise,  strength of cough/expectoration for pulmonary protection.  Thanks for this referral. SLP Visit Diagnosis: Dysphagia, unspecified (R13.10)    Aspiration Risk  Mild aspiration risk    Diet Recommendation Regular;Thin liquid   Liquid Administration via: Cup;Straw Medication Administration: Other (Comment) (as tolerated) Supervision: Patient able to self feed Compensations: Slow rate;Small sips/bites Postural Changes: Seated upright at 90 degrees;Remain  upright for at least 30 minutes after po intake    Other  Recommendations Oral Care Recommendations: Oral care BID    Recommendations for follow up therapy are one component of a multi-disciplinary discharge planning process, led by the attending physician.  Recommendations may be updated based on patient status, additional functional criteria and insurance authorization.  Follow up Recommendations No SLP follow up      Assistance Recommended at Discharge  N/a  Functional Status Assessment Patient has not had a recent decline in their functional status  Frequency and Duration   N/a         Prognosis    N/a    Swallow Study   General Date of Onset: 09/10/22 HPI: 89 yo male adm to [facility] 3/13 with lethargy, weakness and prior cough *wife reports no cough in the last few days. Pt also with left chest wall and left arm pain from postherpetic neuralgia 2015, prostate cancer, pna as a child.  CXR showed LEFT lower lobe infiltrate consistent with pneumonia. Swallow eval ordered. Type of Study: Bedside Swallow Evaluation Diet Prior to this Study: Regular;Thin liquids (Level 0) Temperature Spikes Noted: No Respiratory Status: Room air History of Recent Intubation: No Behavior/Cognition: Alert;Cooperative;Pleasant mood Oral Cavity Assessment: Within Functional Limits Oral Care Completed by SLP: No Oral Cavity - Dentition: Adequate natural dentition Vision: Functional for self-feeding Self-Feeding Abilities: Able to feed self Patient Positioning: Upright in bed Baseline Vocal Quality: Normal Volitional Cough: Strong Volitional Swallow: Able to elicit    Oral/Motor/Sensory Function Overall Oral Motor/Sensory Function: Mild impairment Facial ROM: Reduced right;Suspected CN VII (facial) dysfunction (upper and lower motor neuron - ? consistent with Bell's Palsy , pt denies and states left facial asymmetry since childhood) Facial Symmetry: Abnormal symmetry left;Suspected CN VII (facial)  dysfunction Facial Strength: Within Functional Limits Facial Sensation: Within Functional Limits Lingual ROM: Within Functional Limits Lingual Symmetry: Within Functional Limits Lingual Strength: Within Functional Limits Lingual Sensation: Within Functional Limits Velum: Within Functional Limits Mandible: Within Functional Limits   Ice Chips Ice chips: Not tested   Thin Liquid Thin Liquid: Impaired Presentation: Cup;Self Fed Pharyngeal  Phase Impairments: Cough - Immediate Other Comments: Pt did not pass 3 ounce Yale water screen - as he started overtly coughing after approx 2.5 ounces consumed.  Small boluses tolerated without clinical indication of aspiration or dysphagia.    Nectar Thick Nectar Thick Liquid: Not tested   Honey Thick Honey Thick Liquid: Not tested   Puree Puree: Within functional limits Presentation: Self Fed;Spoon   Solid     Solid: Within functional limits Presentation: Self Fredirick Lathe 09/10/2022,11:57 AM Kathleen Lime, MS [place] Office [phone]

## 2022-09-10 NOTE — Consult Note (Addendum)
[facility]                                                                                        Patient Identification: Patient Name: Stephen Nunez MRN: [id number] [place] Date: 09/09/2022  8:46 AM Today's Date: 09/10/2022 Reason for consult: bacteremia  Requesting provider: champ consult   Principal Problem:   CAP (community acquired pneumonia)   Antibiotics:  Ceftriaxone 3/13 Azithromycin 3/13 Doxycycline 3/13  Lines/Hardware: left TKA,   Assessment 89 Y O male w  PMH as below including aortic stenosis, OSA, who presented to the ED on 3/13 from home via EMS for feeling weak and trouble getting out of the bed.    Possible CAP Strep pyogenes/MRSE in blood cx 1/2 sets: unclear if pathogenic vs contaminant. MRSE likely contaminant. No concerns at Left TKA  Recommendations  Continue CAP tx with ceftriaxone and azithromycin as is. Unclear if bacteremia is truly playing a role here Fu strp and legionella urine ag, respiratory cx Fu blood repeat blood cx Monitor CBC and BMP  Following   Rest of the management as per the primary team. Please call with questions or concerns.  Thank you for the consult  Rosiland Oz, MD Infectious Disease Physician [facility] Infectious Disease [address] [place], [place] [address] Phone: [phone]  Fax: [phone]  __________________________________________________________________________________________________________ HPI and Hospital Course: 69 Y O male w  PMH as below including aortic stenosis, OSA, Prostate ca s/p prostatectomy who presented to the ED on 3/13 from home via EMS for feeling weak and trouble getting out of the bed.  At EMS arrival patient was at 88% SpO2 on room air and was placed on 2 L nasal cannula.  Per wife, both of them were having upper respiratory issues in the ears and sinuses  for last week.  They are vaccinated against RSV, flu and COVID and pneumonia vaccine.  denies any fever or chills.  He has chronic left chest wall and left arm pain from postherpetic neuralgia.  At ED febrile w T max 100.9 Labs remarkable for WBC 10.4 3/13 Influenza A/B/RSV/SARScov2/RVP negative   Patient was given VF IV ceftriaxone, doxycycline for possible community-acquired pneumonia  ROS: General- Denies fever, chills, loss of appetite and loss of weight HEENT - Denies headache, blurry vision, neck pain, sinus pain Chest - Denies any SOB or cough CVS- Denies any dizziness/lightheadedness, syncopal attacks, palpitations Abdomen- Denies any nausea, vomiting, abdominal pain, hematochezia and diarrhea Neuro - Denies any  numbness, tingling sensation Psych - Denies any changes in mood irritability or depressive symptoms GU- Denies any burning, dysuria, hematuria or increased frequency of urination Skin - denies any rashes/lesions MSK - denies any joint pain/swelling or restricted ROM   Past Medical History:  Diagnosis Date   Aortic stenosis    Arthritis    Bell's palsy    mild, left   Heart murmur    History of shingles    2000   Hyperlipidemia    Hypertension    OSA (obstructive sleep apnea)    Pneumonia    as an infant  Post herpetic neuralgia    Prostate cancer    prostate cancer   Tremor    Past Surgical History:  Procedure Laterality Date   COLONOSCOPY     HERNIA REPAIR     INGUINAL HERNIA REPAIR Right 09/07/2017   Procedure: OPEN REPAIR RIGHT INGUINAL HERNIA;  Surgeon: Armandina Gemma, MD;  Location: [facility];  Service: General;  Laterality: Right;   INSERTION OF MESH Right 09/07/2017   Procedure: INSERTION OF MESH;  Surgeon: Armandina Gemma, MD;  Location: [facility];  Service: General;  Laterality: Right;   PROSTATE SURGERY     SPINAL CORD STIMULATOR INSERTION N/A 04/26/2015   Procedure: LUMBAR SPINAL CORD STIMULATOR INSERTION;  Surgeon: Clydell Hakim, MD;  Location: [facility];  Service: Neurosurgery;  Laterality: N/A;   SPINAL CORD STIMULATOR REMOVAL N/A 05/15/2016   Procedure: THORACIC SPINAL CORD STIMULATOR REMOVAL;  Surgeon: Clydell Hakim, MD;  Location: [facility];  Service: Neurosurgery;  Laterality: N/A;  THORACIC SPINAL CORD STIMULATOR REMOVAL   TONSILLECTOMY     TOTAL KNEE ARTHROPLASTY Left 07/01/2021   Procedure: TOTAL KNEE ARTHROPLASTY;  Surgeon: Paralee Cancel, MD;  Location: [facility];  Service: Orthopedics;  Laterality: Left;    Scheduled Meds:  acetaminophen  650 mg Oral Daily   amLODipine  10 mg Oral Daily   aspirin  81 mg Oral Once per day on Mon Wed Fri   docusate sodium  100 mg Oral BID   enoxaparin (LOVENOX) injection  40 mg Subcutaneous Daily   latanoprost  1 drop Both Eyes QHS   rosuvastatin  20 mg Oral Daily   Continuous Infusions:  azithromycin Stopped (09/09/22 1605)   cefTRIAXone (ROCEPHIN)  IV     PRN Meds:.acetaminophen, lidocaine, methocarbamol, polyethylene glycol, polyvinyl alcohol  No Known Allergies  Social History   Socioeconomic History   Marital status: Married    Spouse name: Bethena Roys   Number of children: 0   Years of education: Not on file   Highest education level: Not on file  Occupational History   Occupation: retired  Tobacco Use   Smoking status: Never   Smokeless tobacco: Never  Vaping Use   Vaping Use: Never used  Substance and Sexual Activity   Alcohol use: No   Drug use: No   Sexual activity: Not on file  Other Topics Concern   Not on file  Social History Narrative   He lives with wife.   Retired from working in [employer] fund[employer].   No caffeine         Social Determinants of Health   Financial Resource Strain: Not on file  Food Insecurity: No Food Insecurity (09/09/2022)   Hunger Vital Sign    Worried About Running Out of Food in the Last Year: Never true    Ran Out of Food in the Last Year: Never true  Transportation Needs: No Transportation Needs (09/09/2022)   PRAPARE - Civil engineer, contracting (Medical): No    Lack of Transportation (Non-Medical): No  Physical Activity: Not on file  Stress: Not on file  Social Connections: Not on file  Intimate Partner Violence: Not At Risk (09/09/2022)   Humiliation, Afraid, Rape, and Kick questionnaire    Fear of Current or Ex-Partner: No    Emotionally Abused: No    Physically Abused: No    Sexually Abused: No   Family History  Problem Relation Age of Onset   Heart attack Father    Other Mother  Post-op complications, deceased 59   Cancer Mother    Heart attack Brother        Deceased 10   Stroke Brother    Multiple sclerosis Brother        Deceased, 42   Vitals BP 137/60 (BP Location: Right Arm)   Pulse 62   Temp 99.5 F (37.5 C) (Oral)   Resp 20   Ht '5\' 11"'$  (1.803 m)   Wt 73.7 kg   SpO2 93%   BMI 22.66 kg/m    Physical Exam Constitutional:  elderly male lying in the bed, not in acute distress    Comments: hard of hearing   Cardiovascular:     Rate and Rhythm: Normal rate and regular rhythm.     Heart sounds: s1sw2, murmur +  Pulmonary:     Effort: Pulmonary effort is normal on room air     Comments: crepts in the bases   Abdominal:     Palpations: Abdomen is soft.     Tenderness: non distended and non tender   Musculoskeletal:        General: No swelling or tenderness in peripheral joints, including left TKA  Skin:    Comments: No rashes   Neurological:     General: awake, alert and oriented, follows commands appropriately  Psychiatric:        Mood and Affect: Mood normal.    Pertinent Microbiology Results for orders placed or performed during the hospital encounter of 09/09/22  Resp panel by RT-PCR (RSV, Flu A&B, Covid)     Status: None   Collection Time: 09/09/22  9:22 AM   Specimen: Nasal Swab  Result Value Ref Range Status   SARS Coronavirus 2 by RT PCR NEGATIVE NEGATIVE Final    Comment: (NOTE) SARS-CoV-2 target nucleic acids are NOT DETECTED.  The SARS-CoV-2 RNA  is generally detectable in upper respiratory specimens during the acute phase of infection. The lowest concentration of SARS-CoV-2 viral copies this assay can detect is 138 copies/mL. A negative result does not preclude SARS-Cov-2 infection and should not be used as the sole basis for treatment or other patient management decisions. A negative result may occur with  improper specimen collection/handling, submission of specimen other than nasopharyngeal swab, presence of viral mutation(s) within the areas targeted by this assay, and inadequate number of viral copies(<138 copies/mL). A negative result must be combined with clinical observations, patient history, and epidemiological information. The expected result is Negative.  Fact Sheet for Patients:  EntrepreneurPulse.com.au  Fact Sheet for Healthcare Providers:  IncredibleEmployment.be  This test is no t yet approved or cleared by the [place] FDA and  has been authorized for detection and/or diagnosis of SARS-CoV-2 by FDA under an Emergency Use Authorization (EUA). This EUA will remain  in effect (meaning this test can be used) for the duration of the COVID-19 declaration under Section 564(b)(1) of the Act, 21 U.S.C.section 360bbb-3(b)(1), unless the authorization is terminated  or revoked sooner.       Influenza A by PCR NEGATIVE NEGATIVE Final   Influenza B by PCR NEGATIVE NEGATIVE Final    Comment: (NOTE) The Xpert Xpress SARS-CoV-2/FLU/RSV plus assay is intended as an aid in the diagnosis of influenza from Nasopharyngeal swab specimens and should not be used as a sole basis for treatment. Nasal washings and aspirates are unacceptable for Xpert Xpress SARS-CoV-2/FLU/RSV testing.  Fact Sheet for Patients: EntrepreneurPulse.com.au  Fact Sheet for Healthcare Providers: IncredibleEmployment.be  This test is not yet approved or cleared  by the  [place] and has been authorized for detection and/or diagnosis of SARS-CoV-2 by FDA under an Emergency Use Authorization (EUA). This EUA will remain in effect (meaning this test can be used) for the duration of the COVID-19 declaration under Section 564(b)(1) of the Act, 21 U.S.C. section 360bbb-3(b)(1), unless the authorization is terminated or revoked.     Resp Syncytial Virus by PCR NEGATIVE NEGATIVE Final    Comment: (NOTE) Fact Sheet for Patients: EntrepreneurPulse.com.au  Fact Sheet for Healthcare Providers: IncredibleEmployment.be  This test is not yet approved or cleared by the [place] FDA and has been authorized for detection and/or diagnosis of SARS-CoV-2 by FDA under an Emergency Use Authorization (EUA). This EUA will remain in effect (meaning this test can be used) for the duration of the COVID-19 declaration under Section 564(b)(1) of the Act, 21 U.S.C. section 360bbb-3(b)(1), unless the authorization is terminated or revoked.  Performed at [facility], [place] [address], [place], [place] [address]   Culture, blood (single)     Status: None (Preliminary result)   Collection Time: 09/09/22  9:22 AM   Specimen: BLOOD  Result Value Ref Range Status   Specimen Description   Final    BLOOD SITE NOT SPECIFIED Performed at [facility] [address], [place], [place] [address]    Special Requests   Final    BOTTLES DRAWN AEROBIC AND ANAEROBIC Blood Culture adequate volume Performed at [facility] [address], [place], [place] [address]    Culture  Setup Time   Final    GRAM POSITIVE COCCI ANAEROBIC BOTTLE ONLY Organism ID to follow CRITICAL RESULT CALLED TO, READ BACK BY AND VERIFIED WITH: M LILLISTON,PHARMD'@0423'$  09/10/22 [place] Performed at [facility], [place] [address] [place], [place] [address]    Culture GRAM POSITIVE COCCI  Final   Report Status  PENDING  Incomplete  Blood Culture ID Panel (Reflexed)     Status: Abnormal   Collection Time: 09/09/22  9:22 AM  Result Value Ref Range Status   Enterococcus faecalis NOT DETECTED NOT DETECTED Final   Enterococcus Faecium NOT DETECTED NOT DETECTED Final   Listeria monocytogenes NOT DETECTED NOT DETECTED Final   Staphylococcus species DETECTED (A) NOT DETECTED Final    Comment: CRITICAL RESULT CALLED TO, READ BACK BY AND VERIFIED WITH: M LILLISTON,PHARMD'@0423'$  09/10/22 [place]    Staphylococcus aureus (BCID) NOT DETECTED NOT DETECTED Final   Staphylococcus epidermidis DETECTED (A) NOT DETECTED Final    Comment: Methicillin (oxacillin) resistant coagulase negative staphylococcus. Possible blood culture contaminant (unless isolated from more than one blood culture draw or clinical case suggests pathogenicity). No antibiotic treatment is indicated for blood  culture contaminants. CRITICAL RESULT CALLED TO, READ BACK BY AND VERIFIED WITH: M LILLISTON,PHARMD'@0423'$  09/10/22 [place]    Staphylococcus lugdunensis NOT DETECTED NOT DETECTED Final   Streptococcus species DETECTED (A) NOT DETECTED Final    Comment: CRITICAL RESULT CALLED TO, READ BACK BY AND VERIFIED WITH: M LILLISTON,PHARMD'@0423'$  09/10/22 [place]    Streptococcus agalactiae NOT DETECTED NOT DETECTED Final   Streptococcus pneumoniae NOT DETECTED NOT DETECTED Final   Streptococcus pyogenes DETECTED (A) NOT DETECTED Final    Comment: CRITICAL RESULT CALLED TO, READ BACK BY AND VERIFIED WITH: M LILLISTON,PHARMD'@0423'$  09/10/22 [place]    A.calcoaceticus-baumannii NOT DETECTED NOT DETECTED Final   Bacteroides fragilis NOT DETECTED NOT DETECTED Final   Enterobacterales NOT DETECTED NOT DETECTED Final   Enterobacter cloacae complex NOT DETECTED NOT DETECTED Final  Escherichia coli NOT DETECTED NOT DETECTED Final   Klebsiella aerogenes NOT DETECTED NOT DETECTED Final   Klebsiella oxytoca NOT DETECTED NOT DETECTED Final   Klebsiella pneumoniae NOT DETECTED  NOT DETECTED Final   Proteus species NOT DETECTED NOT DETECTED Final   Salmonella species NOT DETECTED NOT DETECTED Final   Serratia marcescens NOT DETECTED NOT DETECTED Final   Haemophilus influenzae NOT DETECTED NOT DETECTED Final   Neisseria meningitidis NOT DETECTED NOT DETECTED Final   Pseudomonas aeruginosa NOT DETECTED NOT DETECTED Final   Stenotrophomonas maltophilia NOT DETECTED NOT DETECTED Final   Candida albicans NOT DETECTED NOT DETECTED Final   Candida auris NOT DETECTED NOT DETECTED Final   Candida glabrata NOT DETECTED NOT DETECTED Final   Candida krusei NOT DETECTED NOT DETECTED Final   Candida parapsilosis NOT DETECTED NOT DETECTED Final   Candida tropicalis NOT DETECTED NOT DETECTED Final   Cryptococcus neoformans/gattii NOT DETECTED NOT DETECTED Final   Methicillin resistance mecA/C DETECTED (A) NOT DETECTED Final    Comment: CRITICAL RESULT CALLED TO, READ BACK BY AND VERIFIED WITH: M LILLISTON,PHARMD'@0423'$  09/10/22 [place] Performed at [facility], [address] [place], [place] [address]   Respiratory (~20 pathogens) panel by PCR     Status: None   Collection Time: 09/09/22 12:06 PM   Specimen: Nasopharyngeal Swab; Respiratory  Result Value Ref Range Status   Adenovirus NOT DETECTED NOT DETECTED Final   Coronavirus 229E NOT DETECTED NOT DETECTED Final    Comment: (NOTE) The Coronavirus on the Respiratory Panel, DOES NOT test for the novel  Coronavirus (2019 nCoV)    Coronavirus HKU1 NOT DETECTED NOT DETECTED Final   Coronavirus NL63 NOT DETECTED NOT DETECTED Final   Coronavirus OC43 NOT DETECTED NOT DETECTED Final   Metapneumovirus NOT DETECTED NOT DETECTED Final   Rhinovirus / Enterovirus NOT DETECTED NOT DETECTED Final   Influenza A NOT DETECTED NOT DETECTED Final   Influenza B NOT DETECTED NOT DETECTED Final   Parainfluenza Virus 1 NOT DETECTED NOT DETECTED Final   Parainfluenza Virus 2 NOT DETECTED NOT DETECTED Final   Parainfluenza Virus 3 NOT  DETECTED NOT DETECTED Final   Parainfluenza Virus 4 NOT DETECTED NOT DETECTED Final   Respiratory Syncytial Virus NOT DETECTED NOT DETECTED Final   Bordetella pertussis NOT DETECTED NOT DETECTED Final   Bordetella Parapertussis NOT DETECTED NOT DETECTED Final   Chlamydophila pneumoniae NOT DETECTED NOT DETECTED Final   Mycoplasma pneumoniae NOT DETECTED NOT DETECTED Final    Comment: Performed at [facility], [place]. [address] [place], [place] [address]  Expectorated Sputum Assessment w Gram Stain, Rflx to Resp Cult     Status: None   Collection Time: 09/09/22 12:06 PM   Specimen: Expectorated Sputum  Result Value Ref Range Status   Specimen Description EXPECTORATED SPUTUM  Final   Special Requests NONE  Final   Sputum evaluation   Final    Sputum specimen not acceptable for testing.  Please recollect.   Performed at [facility], [place] [address], [place], [place] [address]    Report Status 09/09/2022 FINAL  Final   Pertinent Lab seen by me:    Latest Ref Rng & Units 09/10/2022    4:00 AM 09/09/2022    9:22 AM 04/16/2022    2:13 PM  CBC  WBC 4.0 - 10.5 K/uL 13.1  10.4  6.3   Hemoglobin 13.0 - 17.0 g/dL 9.9  9.5  11.2   Hematocrit 39.0 - 52.0 % 31.5  30.4  34.9   Platelets 150 - 400 K/uL 206  224  204       Latest Ref Rng & Units 09/10/2022    4:00 AM 09/09/2022    9:22 AM 04/16/2022    2:13 PM  CMP  Glucose 70 - 99 mg/dL 116  104  96   BUN 8 - 23 mg/dL '24  23  30   '$ Creatinine 0.61 - 1.24 mg/dL 1.45  1.22  1.13   Sodium 135 - 145 mmol/L 141  141  142   Potassium 3.5 - 5.1 mmol/L 3.8  3.4  4.8   Chloride 98 - 111 mmol/L 104  107  104   CO2 22 - 32 mmol/L '26  25  21   '$ Calcium 8.9 - 10.3 mg/dL 8.7  8.1  9.7   Total Protein 6.5 - 8.1 g/dL 6.1  6.3    Total Bilirubin 0.3 - 1.2 mg/dL 0.9  0.7    Alkaline Phos 38 - 126 U/L 51  56    AST 15 - 41 U/L 23  22    ALT 0 - 44 U/L 11  10       Pertinent Imagings/Other Imagings Plain films and CT images have  been personally visualized and interpreted; radiology reports have been reviewed. Decision making incorporated into the Impression / Recommendations.  DG Chest 2 View  Result Date: 09/09/2022 CLINICAL DATA:  Pneumonia EXAM: CHEST - 2 VIEW COMPARISON:  10/30/2009 FINDINGS: Enlargement of cardiac silhouette. Mediastinal contours and pulmonary vascularity normal. Retrocardiac LEFT lower lobe infiltrate consistent with pneumonia. Minimal atelectasis RIGHT base. Remaining lungs clear. No pleural effusion or pneumothorax. Osseous demineralization with degenerative changes thoracic spine. IMPRESSION: LEFT lower lobe infiltrate consistent with pneumonia. Subsegmental atelectasis RIGHT base. Electronically Signed   By: Lavonia Dana M.D.   On: 09/09/2022 09:39     I spent 90  minutes for this patient encounter including review of prior medical records/discussing diagnostics and treatment plan with the patient/family/coordinate care with primary/other specialits with greater than 50% of time in face to face encounter.   Electronically signed by:   Rosiland Oz, MD Infectious Disease Physician [facility] for Infectious Disease Pager: [phone]

## 2022-09-10 NOTE — Progress Notes (Signed)
  Echocardiogram 2D Echocardiogram has been performed.  Stephen Nunez 09/10/2022, 10:12 AM

## 2022-09-10 NOTE — Progress Notes (Signed)
PHARMACY - PHYSICIAN COMMUNICATION CRITICAL VALUE ALERT - BLOOD CULTURE IDENTIFICATION (BCID)  Stephen Nunez is an 89 y.o. male who presented to [facility] on 09/09/2022 with a chief complaint of weakness.  Assessment:   Admit with PNA on Rocephin + Zithromax for CAP.  Anaerobic bottle of blood cx set (only 1 set collected) + GPC;  BCID + Strep pyogenes and methicillin resistant staph epidermidis.  Possible that MRSE is contaminant.    Name of physician (or Provider) Contacted: A. Zebedee Iba  Current antibiotics: Rocephin + Zithromax  Changes to prescribed antibiotics recommended:  None- consider d/c Zithromax  Consider repeat blood cx  Results for orders placed or performed during the hospital encounter of 09/09/22  Blood Culture ID Panel (Reflexed) (Collected: 09/09/2022  9:22 AM)  Result Value Ref Range   Enterococcus faecalis NOT DETECTED NOT DETECTED   Enterococcus Faecium NOT DETECTED NOT DETECTED   Listeria monocytogenes NOT DETECTED NOT DETECTED   Staphylococcus species DETECTED (A) NOT DETECTED   Staphylococcus aureus (BCID) NOT DETECTED NOT DETECTED   Staphylococcus epidermidis DETECTED (A) NOT DETECTED   Staphylococcus lugdunensis NOT DETECTED NOT DETECTED   Streptococcus species DETECTED (A) NOT DETECTED   Streptococcus agalactiae NOT DETECTED NOT DETECTED   Streptococcus pneumoniae NOT DETECTED NOT DETECTED   Streptococcus pyogenes DETECTED (A) NOT DETECTED   A.calcoaceticus-baumannii NOT DETECTED NOT DETECTED   Bacteroides fragilis NOT DETECTED NOT DETECTED   Enterobacterales NOT DETECTED NOT DETECTED   Enterobacter cloacae complex NOT DETECTED NOT DETECTED   Escherichia coli NOT DETECTED NOT DETECTED   Klebsiella aerogenes NOT DETECTED NOT DETECTED   Klebsiella oxytoca NOT DETECTED NOT DETECTED   Klebsiella pneumoniae NOT DETECTED NOT DETECTED   Proteus species NOT DETECTED NOT DETECTED   Salmonella species NOT DETECTED NOT DETECTED   Serratia marcescens NOT  DETECTED NOT DETECTED   Haemophilus influenzae NOT DETECTED NOT DETECTED   Neisseria meningitidis NOT DETECTED NOT DETECTED   Pseudomonas aeruginosa NOT DETECTED NOT DETECTED   Stenotrophomonas maltophilia NOT DETECTED NOT DETECTED   Candida albicans NOT DETECTED NOT DETECTED   Candida auris NOT DETECTED NOT DETECTED   Candida glabrata NOT DETECTED NOT DETECTED   Candida krusei NOT DETECTED NOT DETECTED   Candida parapsilosis NOT DETECTED NOT DETECTED   Candida tropicalis NOT DETECTED NOT DETECTED   Cryptococcus neoformans/gattii NOT DETECTED NOT DETECTED   Methicillin resistance mecA/C DETECTED (A) NOT DETECTED    Netta Cedars PharmD 09/10/2022  4:40 AM

## 2022-09-10 NOTE — Progress Notes (Signed)
PROGRESS NOTE   Stephen Nunez  [id number] DOB: May 07, 1932 DOA: 09/09/2022 PCP: Stephen Cruel, MD  Brief Narrative:  89 year old white male coming from home (IADL ADL fully functional uses cane) Known history of OSA Postherpetic neuralgia Prostate cancer DVT on Eliquis Aortic stenosis chronic LBBB Myoview 2015 normal Weight at last OV 164 pounds / 74 kg Chronic pain syndrome follows with [facility] Stephen Nunez with pain generator mainly in the left shoulder left upper back on tramadol   Admit for weakness, illness and and found to have PNA Troponin was also elevated on admission but this is felt to be secondary to demand ischemia  Hospital-Problem based course  SIRS criteria on admission 2/2 community-acquired pneumonia - Changed to azithromycin ceftriaxone , and do not narrow for now - NS 50 cc/H X 12 hours - Respiratory viral panel is negative, blood cultures showing Strep Pyogenes, ID auto-consulted   HFpEF - Continue ASA 81 - Resume lisinopril in 24 to 48 hours depending on creatinine - Continue amlodipine 10, Crestor 20, coenzyme Q 10   Elevated troponin on admission in setting of chronic LBBB Aortic stenosis - Case discussed with Dr. Eleonore Nunez of Cardiology who recommends no further workup and overviewed the EKG on 3/13 - Await echocardiogram report from this morning   Postherpetic neuralgia - Continue lidocaine cream 4% 4 times daily as needed, Robaxin 500 every 6 as needed spasm -I have discontinued Norco as he tells me he takes tramadol usually-Pharm.D. to reconcile orders -He will need to follow-up with his outpatient pain physician   DVT previously on Eliquis - Patient is not on this now?-Patient only takes aspirin 3 times a week   Prior prostate cancer history back in year 2000 status post prostatectomy per patient --Outpatient interval follow-up   DVT prophylaxis: Lovenox Code Status: DNR confirmed at the bedside Family Communication:  Discussed with wife Stephen Nunez at the bedside Disposition:  Status is: Inpatient Remains inpatient appropriate because:   Has possible bacteremia not ready for discharge    Subjective: Confabulating a little bit-this is not normal for him but he may not have slept completely No chest pain no fever no chills no rigor  Objective: Vitals:   09/10/22 0059 09/10/22 0417 09/10/22 0418 09/10/22 1049  BP: 106/77  137/60 (!) 122/51  Pulse: 74  62   Resp: 20  20   Temp: 98.4 F (36.9 C)  99.5 F (37.5 C)   TempSrc: Oral  Oral   SpO2: 95%  93%   Weight:  73.7 kg    Height:        Intake/Output Summary (Last 24 hours) at 09/10/2022 1155 Last data filed at 09/10/2022 0417 Gross per 24 hour  Intake 506.73 ml  Output 400 ml  Net 106.73 ml   Filed Weights   09/09/22 0850 09/09/22 1654 09/10/22 0417  Weight: 75 kg 72.4 kg 73.7 kg    Examination:  EOMI NCAT postop changes noted to eyes bilaterally Chest is clear no wheeze rales rhonchi S1-S2 no murmur sinus, sinus bradycardia on monitors Abdomen is soft no rebound no guarding No lower extremity edema  Data Reviewed: personally reviewed   CBC    Component Value Date/Time   WBC 13.1 (H) 09/10/2022 0400   RBC 3.26 (L) 09/10/2022 0400   HGB 9.9 (L) 09/10/2022 0400   HGB 11.2 (L) 04/16/2022 1413   HCT 31.5 (L) 09/10/2022 0400   HCT 34.9 (L) 04/16/2022 1413   PLT 206 09/10/2022 0400  PLT 204 04/16/2022 1413   MCV 96.6 09/10/2022 0400   MCV 95 04/16/2022 1413   MCH 30.4 09/10/2022 0400   MCHC 31.4 09/10/2022 0400   RDW 13.2 09/10/2022 0400   RDW 12.9 04/16/2022 1413   LYMPHSABS 0.7 09/09/2022 0922   MONOABS 1.1 (H) 09/09/2022 0922   EOSABS 0.1 09/09/2022 0922   BASOSABS 0.0 09/09/2022 0922      Latest Ref Rng & Units 09/10/2022    4:00 AM 09/09/2022    9:22 AM 04/16/2022    2:13 PM  CMP  Glucose 70 - 99 mg/dL 116  104  96   BUN 8 - 23 mg/dL '24  23  30   '$ Creatinine 0.61 - 1.24 mg/dL 1.45  1.22  1.13   Sodium 135 - 145  mmol/L 141  141  142   Potassium 3.5 - 5.1 mmol/L 3.8  3.4  4.8   Chloride 98 - 111 mmol/L 104  107  104   CO2 22 - 32 mmol/L '26  25  21   '$ Calcium 8.9 - 10.3 mg/dL 8.7  8.1  9.7   Total Protein 6.5 - 8.1 g/dL 6.1  6.3    Total Bilirubin 0.3 - 1.2 mg/dL 0.9  0.7    Alkaline Phos 38 - 126 U/L 51  56    AST 15 - 41 U/L 23  22    ALT 0 - 44 U/L 11  10       Radiology Studies: ECHOCARDIOGRAM COMPLETE  Result Date: 09/10/2022    ECHOCARDIOGRAM REPORT   Patient Name:   Stephen Nunez Date of Exam: 09/10/2022 Medical Rec #:  [id number]       Height:       71.0 in Accession #:    [id number]      Weight:       162.5 lb Date of Birth:  Dec 05, 1931      BSA:          1.930 m Patient Age:    34 years        BP:           137/60 mmHg Patient Gender: M               HR:           61 bpm. Exam Location:  Inpatient Procedure: 2D Echo, Cardiac Doppler and Color Doppler Indications:    R07.9* Chest pain, unspecified. "Elevated troponin"  History:        Patient has prior history of Echocardiogram examinations, most                 recent 05/04/2022. Aortic Valve Disease; Risk                 Factors:Hypertension and Dyslipidemia. Cancer. Aortic stenosis.  Sonographer:    Stephen Nunez RDCS Referring Phys: [place]  1. Left ventricular ejection fraction, by estimation, is 55%. The left ventricle has normal function. The left ventricle has no regional wall motion abnormalities. There is moderate left ventricular hypertrophy. Left ventricular diastolic parameters are  consistent with Grade I diastolic dysfunction (impaired relaxation).  2. Right ventricular systolic function is normal. The right ventricular size is normal. There is moderately elevated pulmonary artery systolic pressure. The estimated right ventricular systolic pressure is 123456 mmHg.  3. Left atrial size was mildly dilated.  4. The mitral valve is degenerative. Trivial mitral valve regurgitation. No evidence of mitral stenosis.  5.  Tricuspid  valve regurgitation is moderate.  6. The aortic valve is abnormal. There is severe calcifcation of the aortic valve. There is severe thickening of the aortic valve. Aortic valve regurgitation is mild. Moderate aortic valve stenosis. Aortic valve area, by VTI measures 1.22 cm. Aortic valve mean gradient measures 28.0 mmHg. Aortic valve Vmax measures 3.40 m/s.  7. Aortic dilatation noted. There is borderline dilatation of the aortic root, measuring 42 mm.  8. The inferior vena cava is dilated in size with <50% respiratory variability, suggesting right atrial pressure of 15 mmHg. FINDINGS  Left Ventricle: Left ventricular ejection fraction, by estimation, is 55%. The left ventricle has normal function. The left ventricle has no regional wall motion abnormalities. The left ventricular internal cavity size was normal in size. There is moderate left ventricular hypertrophy. Abnormal (paradoxical) septal motion, consistent with left bundle branch block. Left ventricular diastolic parameters are consistent with Grade I diastolic dysfunction (impaired relaxation). Right Ventricle: The right ventricular size is normal. No increase in right ventricular wall thickness. Right ventricular systolic function is normal. There is moderately elevated pulmonary artery systolic pressure. The tricuspid regurgitant velocity is 3.07 m/s, and with an assumed right atrial pressure of 15 mmHg, the estimated right ventricular systolic pressure is 123456 mmHg. Left Atrium: Left atrial size was mildly dilated. Right Atrium: Right atrial size was normal in size. Pericardium: There is no evidence of pericardial effusion. Mitral Valve: The mitral valve is degenerative in appearance. Mild to moderate mitral annular calcification. Trivial mitral valve regurgitation. No evidence of mitral valve stenosis. Tricuspid Valve: The tricuspid valve is grossly normal. Tricuspid valve regurgitation is moderate . No evidence of tricuspid stenosis.  Aortic Valve: The aortic valve is abnormal. There is severe calcifcation of the aortic valve. There is severe thickening of the aortic valve. Aortic valve regurgitation is mild. Aortic regurgitation PHT measures 404 msec. Moderate aortic stenosis is present. Aortic valve mean gradient measures 28.0 mmHg. Aortic valve peak gradient measures 46.1 mmHg. Aortic valve area, by VTI measures 1.22 cm. Pulmonic Valve: The pulmonic valve was not well visualized. Pulmonic valve regurgitation is trivial. No evidence of pulmonic stenosis. Aorta: Aortic dilatation noted. There is borderline dilatation of the aortic root, measuring 42 mm. Venous: The inferior vena cava is dilated in size with less than 50% respiratory variability, suggesting right atrial pressure of 15 mmHg. IAS/Shunts: No atrial level shunt detected by color flow Doppler.  LEFT VENTRICLE PLAX 2D LVIDd:         4.35 cm   Diastology LVIDs:         3.40 cm   LV e' medial:    6.42 cm/s LV PW:         1.35 cm   LV E/e' medial:  9.2 LV IVS:        1.40 cm   LV e' lateral:   6.09 cm/s LVOT diam:     2.10 cm   LV E/e' lateral: 9.7 LV SV:         99 LV SV Index:   51 LVOT Area:     3.46 cm  RIGHT VENTRICLE             IVC RV S prime:     12.40 cm/s  IVC diam: 3.10 cm TAPSE (M-mode): 2.6 cm LEFT ATRIUM             Index        RIGHT ATRIUM           Index LA  diam:        4.80 cm 2.49 cm/m   RA Area:     11.70 cm LA Vol (A2C):   42.3 ml 21.91 ml/m  RA Volume:   25.40 ml  13.16 ml/m LA Vol (A4C):   50.7 ml 26.26 ml/m LA Biplane Vol: 46.7 ml 24.19 ml/m  AORTIC VALVE AV Area (Vmax):    1.38 cm AV Area (Vmean):   1.35 cm AV Area (VTI):     1.22 cm AV Vmax:           339.50 cm/s AV Vmean:          234.000 cm/s AV VTI:            0.812 m AV Peak Grad:      46.1 mmHg AV Mean Grad:      28.0 mmHg LVOT Vmax:         135.00 cm/s LVOT Vmean:        91.000 cm/s LVOT VTI:          0.287 m LVOT/AV VTI ratio: 0.35 AI PHT:            404 msec  AORTA Ao Root diam: 4.20 cm Ao Asc  diam:  3.90 cm MITRAL VALVE               TRICUSPID VALVE MV Area (PHT): 2.91 cm    TR Peak grad:   37.7 mmHg MV Decel Time: 261 msec    TR Vmax:        307.00 cm/s MV E velocity: 58.90 cm/s MV A velocity: 94.70 cm/s  SHUNTS MV E/A ratio:  0.62        Systemic VTI:  0.29 m                            Systemic Diam: 2.10 cm Cherlynn Kaiser MD Electronically signed by Cherlynn Kaiser MD Signature Date/Time: 09/10/2022/10:47:19 AM    Final    DG Chest 2 View  Result Date: 09/09/2022 CLINICAL DATA:  Pneumonia EXAM: CHEST - 2 VIEW COMPARISON:  10/30/2009 FINDINGS: Enlargement of cardiac silhouette. Mediastinal contours and pulmonary vascularity normal. Retrocardiac LEFT lower lobe infiltrate consistent with pneumonia. Minimal atelectasis RIGHT base. Remaining lungs clear. No pleural effusion or pneumothorax. Osseous demineralization with degenerative changes thoracic spine. IMPRESSION: LEFT lower lobe infiltrate consistent with pneumonia. Subsegmental atelectasis RIGHT base. Electronically Signed   By: Lavonia Dana M.D.   On: 09/09/2022 09:39     Scheduled Meds:  acetaminophen  650 mg Oral Daily   amLODipine  10 mg Oral Daily   aspirin  81 mg Oral Once per day on Mon Wed Fri   docusate sodium  100 mg Oral BID   enoxaparin (LOVENOX) injection  40 mg Subcutaneous Daily   latanoprost  1 drop Both Eyes QHS   rosuvastatin  20 mg Oral Daily   Continuous Infusions:  sodium chloride     azithromycin 500 mg (09/10/22 1205)   cefTRIAXone (ROCEPHIN)  IV 2 g (09/10/22 1102)     LOS: 1 day   Time spent: Ware, MD [facility] To contact the attending provider between 7A-7P or the covering provider during after hours 7P-7A, please log into the web site [email or web address] and access using universal [place] password for that web site. If you do not have the password, please call the hospital operator.  09/10/2022, 11:55 AM

## 2022-09-10 NOTE — TOC Initial Note (Signed)
Transition of Care [facility]) - Initial/Assessment Note    Patient Details  Name: Stephen Nunez MRN: [id number] Date of Birth: [birth year]  Transition of Care [facility]) CM/SW Contact:    Henrietta Dine, RN Phone Number: 09/10/2022, 12:18 PM  Clinical Narrative:                 [facility] consult for d/c planning; spoke w/ pt and wife Bethena Roys in room; pt says he is from home and plans to return at d/c; he identified wife Bethena Roys as [place] [phone]); pt denies IPV, food insecurity, and difficulty paying utilities; he has transportation; pt says he wears glasses; he does not have HA or dentures; pt says he has a cane, walker, and BSC; he does not have other DME, home oxygen, or HH services; TOC will follow.  Expected Discharge Plan: Home/Self Care Barriers to Discharge: Continued Medical Work up   Patient Goals and CMS Choice Patient states their goals for this hospitalization and ongoing recovery are:: home          Expected Discharge Plan and Services   Discharge Planning Services: CM Consult   Living arrangements for the past 2 months: Single Family Home                                      Prior Living Arrangements/Services Living arrangements for the past 2 months: Single Family Home Lives with:: Spouse Patient language and need for interpreter reviewed:: Yes Do you feel safe going back to the place where you live?: Yes      Need for Family Participation in Patient Care: Yes (Comment) Care giver support system in place?: Yes (comment) Current home services: DME (has cane, walker, and BSC) Criminal Activity/Legal Involvement Pertinent to Current Situation/Hospitalization: No - Comment as needed  Activities of Daily Living Home Assistive Devices/Equipment: Blood pressure cuff, Grab bars around toilet, Grab bars in shower ADL Screening (condition at time of admission) Patient's cognitive ability adequate to safely complete daily activities?: Yes Is the patient deaf or  have difficulty hearing?: No Does the patient have difficulty seeing, even when wearing glasses/contacts?: No Does the patient have difficulty concentrating, remembering, or making decisions?: No Patient able to express need for assistance with ADLs?: Yes Does the patient have difficulty dressing or bathing?: No Independently performs ADLs?: Yes (appropriate for developmental age) Does the patient have difficulty walking or climbing stairs?: No Weakness of Legs: None Weakness of Arms/Hands: None  Permission Sought/Granted Permission sought to share information with : Case Manager Permission granted to share information with : Yes, Verbal Permission Granted  Share Information with NAME: Lenor Coffin, RN, CM     Permission granted to share info w Relationship: Moshe Turchetta (wife) [phone]     Emotional Assessment Appearance:: Appears stated age Attitude/Demeanor/Rapport: Gracious Affect (typically observed): Accepting Orientation: : Oriented to Self, Oriented to Place, Oriented to  Time, Oriented to Situation Alcohol / Substance Use: Not Applicable Psych Involvement: No (comment)  Admission diagnosis:  CAP (community acquired pneumonia) [J18.9] Pneumonia due to infectious organism, unspecified laterality, unspecified part of lung [J18.9] Patient Active Problem List   Diagnosis Date Noted   CAP (community acquired pneumonia) 09/09/2022   S/P total knee arthroplasty, left 07/01/2021   Arthralgia of shoulder 03/05/2020   Malignant neoplasm of prostate ([place]) 03/05/2020   Hypercholesterolemia without hypertriglyceridemia 03/05/2020   Motion sickness 03/05/2020   Right inguinal hernia 09/05/2017  Chronic pain of left knee 08/25/2017   HTN (hypertension) 03/19/2016   History of prostate cancer 03/19/2016   Chronic pain syndrome 03/19/2016   History of shingles 03/19/2016   Encounter for general adult medical examination with abnormal findings 07/16/2014   Post herpetic  neuralgia 09/21/2013   PCP:  Lawerance Cruel, MD Pharmacy:   [id number] [address]Phone: [phone]: [phone] [facility] [place] [address] [place]Phone: [phone]: [phone]    Social Determinants of Health (SDOH) Social History: [place] No Food Insecurity (09/09/2022)  Housing: Low Risk  (09/09/2022)  Transportation Needs: No Transportation Needs (09/09/2022)  Utilities: Not At Risk (09/09/2022)  Tobacco Use: Low Risk  (09/09/2022)   SDOH Interventions:     Readmission Risk Interventions     No data to display

## 2022-09-11 DIAGNOSIS — J189 Pneumonia, unspecified organism: Secondary | ICD-10-CM | POA: Diagnosis not present

## 2022-09-11 LAB — CBC WITH DIFFERENTIAL/PLATELET
Abs Immature Granulocytes: 0.03 10*3/uL (ref 0.00–0.07)
Basophils Absolute: 0.1 10*3/uL (ref 0.0–0.1)
Basophils Relative: 1 %
Eosinophils Absolute: 0.3 10*3/uL (ref 0.0–0.5)
Eosinophils Relative: 3 %
HCT: 31.7 % — ABNORMAL LOW (ref 39.0–52.0)
Hemoglobin: 9.9 g/dL — ABNORMAL LOW (ref 13.0–17.0)
Immature Granulocytes: 0 %
Lymphocytes Relative: 18 %
Lymphs Abs: 1.7 10*3/uL (ref 0.7–4.0)
MCH: 30 pg (ref 26.0–34.0)
MCHC: 31.2 g/dL (ref 30.0–36.0)
MCV: 96.1 fL (ref 80.0–100.0)
Monocytes Absolute: 1.5 10*3/uL — ABNORMAL HIGH (ref 0.1–1.0)
Monocytes Relative: 15 %
Neutro Abs: 6.4 10*3/uL (ref 1.7–7.7)
Neutrophils Relative %: 63 %
Platelets: 213 10*3/uL (ref 150–400)
RBC: 3.3 MIL/uL — ABNORMAL LOW (ref 4.22–5.81)
RDW: 13.2 % (ref 11.5–15.5)
WBC: 9.9 10*3/uL (ref 4.0–10.5)
nRBC: 0 % (ref 0.0–0.2)

## 2022-09-11 LAB — BASIC METABOLIC PANEL
Anion gap: 5 (ref 5–15)
BUN: 24 mg/dL — ABNORMAL HIGH (ref 8–23)
CO2: 26 mmol/L (ref 22–32)
Calcium: 8.3 mg/dL — ABNORMAL LOW (ref 8.9–10.3)
Chloride: 107 mmol/L (ref 98–111)
Creatinine, Ser: 1.28 mg/dL — ABNORMAL HIGH (ref 0.61–1.24)
GFR, Estimated: 54 mL/min — ABNORMAL LOW (ref >60)
Glucose, Bld: 99 mg/dL (ref 70–99)
Potassium: 3.7 mmol/L (ref 3.5–5.1)
Sodium: 138 mmol/L (ref 135–145)

## 2022-09-11 LAB — LEGIONELLA PNEUMOPHILA SEROGP 1 UR AG: L. pneumophila Serogp 1 Ur Ag: NEGATIVE

## 2022-09-11 MED ORDER — AMOXICILLIN-POT CLAVULANATE 500-125 MG PO TABS: 1.0000 | ORAL_TABLET | Freq: Two times a day (BID) | ORAL | Status: DC

## 2022-09-11 MED ORDER — AMOXICILLIN-POT CLAVULANATE 875-125 MG PO TABS
1.0000 | ORAL_TABLET | Freq: Two times a day (BID) | ORAL | Status: DC
  Administered 2022-09-11 – 2022-09-12 (×2): 1 via ORAL
  Filled 2022-09-11 (×3): qty 1

## 2022-09-11 MED ORDER — AZITHROMYCIN 250 MG PO TABS
500.0000 mg | ORAL_TABLET | Freq: Every day | ORAL | Status: DC
  Administered 2022-09-12: 500 mg via ORAL
  Filled 2022-09-11: qty 2

## 2022-09-11 NOTE — Progress Notes (Addendum)
[facility] Infectious Diseases Follow Up Note  Patient Identification: Patient Name: Stephen Nunez MRN: [id number] [place] Date: 09/09/2022  8:46 AM Age: 89 y.o.Today's Date: 09/11/2022  Reason for Visit: PNA, bacteremia   Principal Problem:   CAP (community acquired pneumonia)  Antibiotics:  Ceftriaxone 3/13-c Azithromycin 3/13-c Doxycycline 3/13   Lines/Hardware: left TKA  Interval Events: afebrile in the last 24 hrs  Assessment 39 Y O male w  PMH as below including aortic stenosis, OSA, who presented to the ED on 3/13 from home via EMS for feeling weak and trouble getting out of the bed.  Admitted with   # Possible CAP - on room air and no respiratory complaints   # Strep pyogenes/MRSE in blood cx 1/2 sets: unclear if pathogenic vs contaminant. MRSE likely contaminant. No concerns at Left TKA . TTE 3/15 with no vegetations or endocarditis. Low suspicion for endocarditis   # Moderate aortic stenosis - possibly contributing to the weakness and defer management to cardiology/primary  Recommendations Complete 5 days of azithromycin Continue ceftriaxone for now. OK to switch augmentin to complete 7 days when ready for discharge Fu repeat blood cx 3/14 for at least 48 hrs to make sure they are negative prior to discharge D/w ID Pharm D ID will SO, please call with questions/repeat blood cx 3/14 with any growth  Rest of the management as per the primary team. Thank you for the consult. Please page with pertinent questions or concerns.  ______________________________________________________________________ Subjective patient seen and examined at the bedside. He is eating breakfast. Denies any SOB, cough and chest pain   Vitals BP 129/60   Pulse 63   Temp 98.3 F (36.8 C) (Oral)   Resp 18   Ht 5\' 11"  (1.803 m)   Wt 74.2 kg   SpO2 96%   BMI 22.81 kg/m     Physical Exam Constitutional:  elderly male sitting in the  edge of the bed and having breakfast     Comments:   Cardiovascular:     Rate and Rhythm: Normal rate and regular rhythm.     Heart sounds:  Pulmonary:     Effort: Pulmonary effort is normal on room air     Comments:   Abdominal:     Palpations: Abdomen is soft.     Tenderness: non distended   Musculoskeletal:        General: No swelling or tenderness in peripheral joints   Skin:    Comments: No rashes   Neurological:     General: awake, alert and oriented to name, place and person, follows commands appropriately   Psychiatric:        Mood and Affect: Mood normal.   Pertinent Microbiology Results for orders placed or performed during the hospital encounter of 09/09/22  Resp panel by RT-PCR (RSV, Flu A&B, Covid)     Status: None   Collection Time: 09/09/22  9:22 AM   Specimen: Nasal Swab  Result Value Ref Range Status   SARS Coronavirus 2 by RT PCR NEGATIVE NEGATIVE Final    Comment: (NOTE) SARS-CoV-2 target nucleic acids are NOT DETECTED.  The SARS-CoV-2 RNA is generally detectable in upper respiratory specimens during the acute phase of infection. The lowest concentration of SARS-CoV-2 viral copies this assay can detect is 138 copies/mL. A negative result does not preclude SARS-Cov-2 infection and should not be used as the sole basis for treatment or other patient management decisions. A negative result may occur with  improper specimen collection/handling, submission  of specimen other than nasopharyngeal swab, presence of viral mutation(s) within the areas targeted by this assay, and inadequate number of viral copies(<138 copies/mL). A negative result must be combined with clinical observations, patient history, and epidemiological information. The expected result is Negative.  Fact Sheet for Patients:  EntrepreneurPulse.com.au  Fact Sheet for Healthcare Providers:  IncredibleEmployment.be  This test is no t yet approved or  cleared by the [place] FDA and  has been authorized for detection and/or diagnosis of SARS-CoV-2 by FDA under an Emergency Use Authorization (EUA). This EUA will remain  in effect (meaning this test can be used) for the duration of the COVID-19 declaration under Section 564(b)(1) of the Act, 21 U.S.C.section 360bbb-3(b)(1), unless the authorization is terminated  or revoked sooner.       Influenza A by PCR NEGATIVE NEGATIVE Final   Influenza B by PCR NEGATIVE NEGATIVE Final    Comment: (NOTE) The Xpert Xpress SARS-CoV-2/FLU/RSV plus assay is intended as an aid in the diagnosis of influenza from Nasopharyngeal swab specimens and should not be used as a sole basis for treatment. Nasal washings and aspirates are unacceptable for Xpert Xpress SARS-CoV-2/FLU/RSV testing.  Fact Sheet for Patients: EntrepreneurPulse.com.au  Fact Sheet for Healthcare Providers: IncredibleEmployment.be  This test is not yet approved or cleared by the [place] FDA and has been authorized for detection and/or diagnosis of SARS-CoV-2 by FDA under an Emergency Use Authorization (EUA). This EUA will remain in effect (meaning this test can be used) for the duration of the COVID-19 declaration under Section 564(b)(1) of the Act, 21 U.S.C. section 360bbb-3(b)(1), unless the authorization is terminated or revoked.     Resp Syncytial Virus by PCR NEGATIVE NEGATIVE Final    Comment: (NOTE) Fact Sheet for Patients: EntrepreneurPulse.com.au  Fact Sheet for Healthcare Providers: IncredibleEmployment.be  This test is not yet approved or cleared by the [place] FDA and has been authorized for detection and/or diagnosis of SARS-CoV-2 by FDA under an Emergency Use Authorization (EUA). This EUA will remain in effect (meaning this test can be used) for the duration of the COVID-19 declaration under Section 564(b)(1) of the Act, 21  U.S.C. section 360bbb-3(b)(1), unless the authorization is terminated or revoked.  Performed at [facility], [place] [address], [place], [place] [address]   Culture, blood (single)     Status: None (Preliminary result)   Collection Time: 09/09/22  9:22 AM   Specimen: BLOOD  Result Value Ref Range Status   Specimen Description   Final    BLOOD SITE NOT SPECIFIED Performed at [place] [address] [place], [place] [address]    Special Requests   Final    BOTTLES DRAWN AEROBIC AND ANAEROBIC Blood Culture adequate volume Performed at [place] [address] [place], [place] [address]    Culture  Setup Time   Final    GRAM POSITIVE COCCI ANAEROBIC BOTTLE ONLY CRITICAL RESULT CALLED TO, READ BACK BY AND VERIFIED WITH: M LILLISTON,PHARMD@[id number]  09/10/22 [place] Performed at [facility], [place] [address] [place], [place] [address]    Culture GRAM POSITIVE COCCI  Final   Report Status PENDING  Incomplete  Blood Culture ID Panel (Reflexed)     Status: Abnormal   Collection Time: 09/09/22  9:22 AM  Result Value Ref Range Status   Enterococcus faecalis NOT DETECTED NOT DETECTED Final   Enterococcus Faecium NOT DETECTED NOT DETECTED Final   Listeria monocytogenes NOT DETECTED NOT DETECTED Final  Staphylococcus species DETECTED (A) NOT DETECTED Final    Comment: CRITICAL RESULT CALLED TO, READ BACK BY AND VERIFIED WITH: M LILLISTON,PHARMD@0423  09/10/22 [place]    Staphylococcus aureus (BCID) NOT DETECTED NOT DETECTED Final   Staphylococcus epidermidis DETECTED (A) NOT DETECTED Final    Comment: Methicillin (oxacillin) resistant coagulase negative staphylococcus. Possible blood culture contaminant (unless isolated from more than one blood culture draw or clinical case suggests pathogenicity). No antibiotic treatment is indicated for blood  culture contaminants. CRITICAL RESULT CALLED TO, READ BACK BY AND VERIFIED WITH: M  LILLISTON,PHARMD@0423  09/10/22 [place]    Staphylococcus lugdunensis NOT DETECTED NOT DETECTED Final   Streptococcus species DETECTED (A) NOT DETECTED Final    Comment: CRITICAL RESULT CALLED TO, READ BACK BY AND VERIFIED WITH: M LILLISTON,PHARMD@0423  09/10/22 [place]    Streptococcus agalactiae NOT DETECTED NOT DETECTED Final   Streptococcus pneumoniae NOT DETECTED NOT DETECTED Final   Streptococcus pyogenes DETECTED (A) NOT DETECTED Final    Comment: CRITICAL RESULT CALLED TO, READ BACK BY AND VERIFIED WITH: M LILLISTON,PHARMD@0423  09/10/22 [place]    A.calcoaceticus-baumannii NOT DETECTED NOT DETECTED Final   Bacteroides fragilis NOT DETECTED NOT DETECTED Final   Enterobacterales NOT DETECTED NOT DETECTED Final   Enterobacter cloacae complex NOT DETECTED NOT DETECTED Final   Escherichia coli NOT DETECTED NOT DETECTED Final   Klebsiella aerogenes NOT DETECTED NOT DETECTED Final   Klebsiella oxytoca NOT DETECTED NOT DETECTED Final   Klebsiella pneumoniae NOT DETECTED NOT DETECTED Final   Proteus species NOT DETECTED NOT DETECTED Final   Salmonella species NOT DETECTED NOT DETECTED Final   Serratia marcescens NOT DETECTED NOT DETECTED Final   Haemophilus influenzae NOT DETECTED NOT DETECTED Final   Neisseria meningitidis NOT DETECTED NOT DETECTED Final   Pseudomonas aeruginosa NOT DETECTED NOT DETECTED Final   Stenotrophomonas maltophilia NOT DETECTED NOT DETECTED Final   Candida albicans NOT DETECTED NOT DETECTED Final   Candida auris NOT DETECTED NOT DETECTED Final   Candida glabrata NOT DETECTED NOT DETECTED Final   Candida krusei NOT DETECTED NOT DETECTED Final   Candida parapsilosis NOT DETECTED NOT DETECTED Final   Candida tropicalis NOT DETECTED NOT DETECTED Final   Cryptococcus neoformans/gattii NOT DETECTED NOT DETECTED Final   Methicillin resistance mecA/C DETECTED (A) NOT DETECTED Final    Comment: CRITICAL RESULT CALLED TO, READ BACK BY AND VERIFIED WITH: M LILLISTON,PHARMD@0423   09/10/22 [place] Performed at [facility], [address] [place], [place] [address]   Respiratory (~20 pathogens) panel by PCR     Status: None   Collection Time: 09/09/22 12:06 PM   Specimen: Nasopharyngeal Swab; Respiratory  Result Value Ref Range Status   Adenovirus NOT DETECTED NOT DETECTED Final   Coronavirus 229E NOT DETECTED NOT DETECTED Final    Comment: (NOTE) The Coronavirus on the Respiratory Panel, DOES NOT test for the novel  Coronavirus (2019 nCoV)    Coronavirus HKU1 NOT DETECTED NOT DETECTED Final   Coronavirus NL63 NOT DETECTED NOT DETECTED Final   Coronavirus OC43 NOT DETECTED NOT DETECTED Final   Metapneumovirus NOT DETECTED NOT DETECTED Final   Rhinovirus / Enterovirus NOT DETECTED NOT DETECTED Final   Influenza A NOT DETECTED NOT DETECTED Final   Influenza B NOT DETECTED NOT DETECTED Final   Parainfluenza Virus 1 NOT DETECTED NOT DETECTED Final   Parainfluenza Virus 2 NOT DETECTED NOT DETECTED Final   Parainfluenza Virus 3 NOT DETECTED NOT DETECTED Final   Parainfluenza Virus 4 NOT DETECTED NOT DETECTED Final   Respiratory Syncytial  Virus NOT DETECTED NOT DETECTED Final   Bordetella pertussis NOT DETECTED NOT DETECTED Final   Bordetella Parapertussis NOT DETECTED NOT DETECTED Final   Chlamydophila pneumoniae NOT DETECTED NOT DETECTED Final   Mycoplasma pneumoniae NOT DETECTED NOT DETECTED Final    Comment: Performed at [facility], [place] [address] [place], [place] [address]  Expectorated Sputum Assessment w Gram Stain, Rflx to Resp Cult     Status: None   Collection Time: 09/09/22 12:06 PM   Specimen: Expectorated Sputum  Result Value Ref Range Status   Specimen Description EXPECTORATED SPUTUM  Final   Special Requests NONE  Final   Sputum evaluation   Final    Sputum specimen not acceptable for testing.  Please recollect.   Performed at [facility], [place] [address], [place], [place] [address]    Report Status 09/09/2022 FINAL   Final  Culture, blood (Routine X 2) w Reflex to ID Panel     Status: None (Preliminary result)   Collection Time: 09/10/22  5:29 AM   Specimen: BLOOD LEFT FOREARM  Result Value Ref Range Status   Specimen Description   Final    BLOOD LEFT FOREARM Performed at [facility], [place] [address] [place], [place] [address]    Special Requests   Final    BOTTLES DRAWN AEROBIC AND ANAEROBIC Blood Culture adequate volume Performed at [place] [address], [place], [place] [address]    Culture   Final    NO GROWTH 1 DAY Performed at [facility], [place] [address], [place], [place] [address]    Report Status PENDING  Incomplete  Culture, blood (Routine X 2) w Reflex to ID Panel     Status: None (Preliminary result)   Collection Time: 09/10/22  5:29 AM   Specimen: BLOOD LEFT FOREARM  Result Value Ref Range Status   Specimen Description   Final    BLOOD LEFT FOREARM Performed at [facility], [place] [address], [place], [place] [address]    Special Requests   Final    BOTTLES DRAWN AEROBIC AND ANAEROBIC Blood Culture adequate volume Performed at [place] [address], [place], [place] [address]    Culture   Final    NO GROWTH 1 DAY Performed at [facility], [place] [address] [place], [place] [address]    Report Status PENDING  Incomplete     Pertinent Lab.    Latest Ref Rng & Units 09/11/2022    4:48 AM 09/10/2022    4:00 AM 09/09/2022    9:22 AM  CBC  WBC 4.0 - 10.5 K/uL 9.9  13.1  10.4   Hemoglobin 13.0 - 17.0 g/dL 9.9  9.9  9.5   Hematocrit 39.0 - 52.0 % 31.7  31.5  30.4   Platelets 150 - 400 K/uL 213  206  224       Latest Ref Rng & Units 09/11/2022    4:48 AM 09/10/2022    4:00 AM 09/09/2022    9:22 AM  CMP  Glucose 70 - 99 mg/dL 99  116  104   BUN 8 - 23 mg/dL 24  24  23    Creatinine 0.61 - 1.24 mg/dL 1.28  1.45  1.22   Sodium 135 - 145 mmol/L 138  141  141   Potassium 3.5 - 5.1 mmol/L 3.7  3.8  3.4    Chloride 98 - 111 mmol/L 107  104  107   CO2 22 - 32 mmol/L 26  26  25    Calcium 8.9 - 10.3 mg/dL 8.3  8.7  8.1   Total Protein 6.5 - 8.1 g/dL  6.1  6.3   Total Bilirubin 0.3 - 1.2 mg/dL  0.9  0.7   Alkaline Phos 38 - 126 U/L  51  56   AST 15 - 41 U/L  23  22   ALT 0 - 44 U/L  11  10      Pertinent Imaging today Plain films and CT images have been personally visualized and interpreted; radiology reports have been reviewed. Decision making incorporated into the Impression / Recommendations.  No results found.  I spent 55 minutes for this patient encounter including review of prior medical records, coordination of care with primary/other specialist with greater than 50% of time being face to face/counseling and discussing diagnostics/treatment plan with the patient/family.  Electronically signed by:   Rosiland Oz, MD Infectious Disease Physician [facility] for Infectious Disease Pager: [phone]

## 2022-09-11 NOTE — Progress Notes (Addendum)
PROGRESS NOTE   Stephen Nunez  [id number] DOB: 1932-03-19 DOA: 09/09/2022 PCP: Lawerance Cruel, MD  Brief Narrative:   89 year old white male coming from home (IADL ADL fully functional uses cane) Known history of OSA Postherpetic neuralgia Prostate cancer DVT on Eliquis Aortic stenosis chronic LBBB Myoview 2015 normal Weight at last OV 164 pounds / 74 kg Chronic pain syndrome follows with [facility] Dr. Burton Apley with pain generator mainly in the left shoulder left upper back on tramadol   Admit for weakness, illness and and found to have PNA Troponin was also elevated on admission but this is felt to be secondary to demand ischemia  Hospital-Problem based course  SIRS criteria on admission 2/2 community-acquired pneumonia blood cultures 3/13 showing Strep Pyogenes - Changed to azithromycin ceftriaxone-ID auto-consulted, BC x2 repeat from 3/14 pending - NS 50 cc/H  d/c'd - Respiratory viral panel is negative   HFpEF - Continue ASA 81 - Resume lisinopril in 24 to 48 hours depending on creatinine - Continue amlodipine 10, Crestor 20, coenzyme Q 10   NSTEMI in setting of chronic LBBB Aortic stenosis-moderate ECHO this admit EF 55%, no mention WM anomaly - Case discussed with Dr. Eleonore Chiquito of Cardiology on 3/13 who recommends no further workup and overviewed the EKG on 3/13   Postherpetic neuralgia - Continue lidocaine cream 4% 4 times daily as needed, Robaxin 500 every 6 as needed spasm discontinued Norco as he tells me he takes tramadol usually -He will need to follow-up with his outpatient pain physician   DVT previously on Eliquis - no further Rx with ASA, unless determined appropriate by PCP   Prior prostate cancer history back in year 2000 status post prostatectomy per patient --Outpatient interval follow-up   DVT prophylaxis: Lovenox Code Status: DNR confirmed at the bedside Family Communication:  Disposition:  Status is: Inpatient Remains  inpatient appropriate because:   ?bacteremia-- not ready for discharge    Subjective:  Awake coherent in nad no focal deficit Much more closer to his usual baseline  Objective: Vitals:   09/11/22 0421 09/11/22 0500 09/11/22 1136 09/11/22 1423  BP: 138/63  129/60 133/66  Pulse: 63   (!) 59  Resp: 18   20  Temp: 98.3 F (36.8 C)   (!) 97.5 F (36.4 C)  TempSrc: Oral     SpO2: 96%   98%  Weight:  74.2 kg    Height:        Intake/Output Summary (Last 24 hours) at 09/11/2022 1443 Last data filed at 09/11/2022 1300 Gross per 24 hour  Intake 1221.17 ml  Output 750 ml  Net 471.17 ml    Filed Weights   09/09/22 1654 09/10/22 0417 09/11/22 0500  Weight: 72.4 kg 73.7 kg 74.2 kg    Examination:  Post op eye changes No ict no pallor Nsr on monitors Cta b no added sound no wheeze no rales no rhonchi Abdomen soft nt nd no rebound no guard Neuro intact no focal deficit  Data Reviewed: personally reviewed   CBC    Component Value Date/Time   WBC 9.9 09/11/2022 0448   RBC 3.30 (L) 09/11/2022 0448   HGB 9.9 (L) 09/11/2022 0448   HGB 11.2 (L) 04/16/2022 1413   HCT 31.7 (L) 09/11/2022 0448   HCT 34.9 (L) 04/16/2022 1413   PLT 213 09/11/2022 0448   PLT 204 04/16/2022 1413   MCV 96.1 09/11/2022 0448   MCV 95 04/16/2022 1413   MCH 30.0 09/11/2022 0448  MCHC 31.2 09/11/2022 0448   RDW 13.2 09/11/2022 0448   RDW 12.9 04/16/2022 1413   LYMPHSABS 1.7 09/11/2022 0448   MONOABS 1.5 (H) 09/11/2022 0448   EOSABS 0.3 09/11/2022 0448   BASOSABS 0.1 09/11/2022 0448      Latest Ref Rng & Units 09/11/2022    4:48 AM 09/10/2022    4:00 AM 09/09/2022    9:22 AM  CMP  Glucose 70 - 99 mg/dL 99  116  104   BUN 8 - 23 mg/dL 24  24  23    Creatinine 0.61 - 1.24 mg/dL 1.28  1.45  1.22   Sodium 135 - 145 mmol/L 138  141  141   Potassium 3.5 - 5.1 mmol/L 3.7  3.8  3.4   Chloride 98 - 111 mmol/L 107  104  107   CO2 22 - 32 mmol/L 26  26  25    Calcium 8.9 - 10.3 mg/dL 8.3  8.7  8.1    Total Protein 6.5 - 8.1 g/dL  6.1  6.3   Total Bilirubin 0.3 - 1.2 mg/dL  0.9  0.7   Alkaline Phos 38 - 126 U/L  51  56   AST 15 - 41 U/L  23  22   ALT 0 - 44 U/L  11  10      Radiology Studies: ECHOCARDIOGRAM COMPLETE  Result Date: 09/10/2022    ECHOCARDIOGRAM REPORT   Patient Name:   Stephen Nunez Date of Exam: 09/10/2022 Medical Rec #:  [id number]       Height:       71.0 in Accession #:    [id number]      Weight:       162.5 lb Date of Birth:  1932-04-04      BSA:          1.930 m Patient Age:    43 years        BP:           137/60 mmHg Patient Gender: M               HR:           61 bpm. Exam Location:  Inpatient Procedure: 2D Echo, Cardiac Doppler and Color Doppler Indications:    R07.9* Chest pain, unspecified. "Elevated troponin"  History:        Patient has prior history of Echocardiogram examinations, most                 recent 05/04/2022. Aortic Valve Disease; Risk                 Factors:Hypertension and Dyslipidemia. Cancer. Aortic stenosis.  Sonographer:    Roseanna Rainbow RDCS Referring Phys: [place]  1. Left ventricular ejection fraction, by estimation, is 55%. The left ventricle has normal function. The left ventricle has no regional wall motion abnormalities. There is moderate left ventricular hypertrophy. Left ventricular diastolic parameters are  consistent with Grade I diastolic dysfunction (impaired relaxation).  2. Right ventricular systolic function is normal. The right ventricular size is normal. There is moderately elevated pulmonary artery systolic pressure. The estimated right ventricular systolic pressure is 123456 mmHg.  3. Left atrial size was mildly dilated.  4. The mitral valve is degenerative. Trivial mitral valve regurgitation. No evidence of mitral stenosis.  5. Tricuspid valve regurgitation is moderate.  6. The aortic valve is abnormal. There is severe calcifcation of the aortic valve. There is severe thickening of the  aortic valve. Aortic  valve regurgitation is mild. Moderate aortic valve stenosis. Aortic valve area, by VTI measures 1.22 cm. Aortic valve mean gradient measures 28.0 mmHg. Aortic valve Vmax measures 3.40 m/s.  7. Aortic dilatation noted. There is borderline dilatation of the aortic root, measuring 42 mm.  8. The inferior vena cava is dilated in size with <50% respiratory variability, suggesting right atrial pressure of 15 mmHg. FINDINGS  Left Ventricle: Left ventricular ejection fraction, by estimation, is 55%. The left ventricle has normal function. The left ventricle has no regional wall motion abnormalities. The left ventricular internal cavity size was normal in size. There is moderate left ventricular hypertrophy. Abnormal (paradoxical) septal motion, consistent with left bundle branch block. Left ventricular diastolic parameters are consistent with Grade I diastolic dysfunction (impaired relaxation). Right Ventricle: The right ventricular size is normal. No increase in right ventricular wall thickness. Right ventricular systolic function is normal. There is moderately elevated pulmonary artery systolic pressure. The tricuspid regurgitant velocity is 3.07 m/s, and with an assumed right atrial pressure of 15 mmHg, the estimated right ventricular systolic pressure is 123456 mmHg. Left [facility]: Left atrial size was mildly dilated. Right [facility]: Right atrial size was normal in size. Pericardium: There is no evidence of pericardial effusion. Mitral Valve: The mitral valve is degenerative in appearance. Mild to moderate mitral annular calcification. Trivial mitral valve regurgitation. No evidence of mitral valve stenosis. Tricuspid Valve: The tricuspid valve is grossly normal. Tricuspid valve regurgitation is moderate . No evidence of tricuspid stenosis. Aortic Valve: The aortic valve is abnormal. There is severe calcifcation of the aortic valve. There is severe thickening of the aortic valve. Aortic valve regurgitation is mild. Aortic  regurgitation PHT measures 404 msec. Moderate aortic stenosis is present. Aortic valve mean gradient measures 28.0 mmHg. Aortic valve peak gradient measures 46.1 mmHg. Aortic valve area, by VTI measures 1.22 cm. Pulmonic Valve: The pulmonic valve was not well visualized. Pulmonic valve regurgitation is trivial. No evidence of pulmonic stenosis. Aorta: Aortic dilatation noted. There is borderline dilatation of the aortic root, measuring 42 mm. Venous: The inferior vena cava is dilated in size with less than 50% respiratory variability, suggesting right atrial pressure of 15 mmHg. IAS/Shunts: No atrial level shunt detected by color flow Doppler.  LEFT VENTRICLE PLAX 2D LVIDd:         4.35 cm   Diastology LVIDs:         3.40 cm   LV e' medial:    6.42 cm/s LV PW:         1.35 cm   LV E/e' medial:  9.2 LV IVS:        1.40 cm   LV e' lateral:   6.09 cm/s LVOT diam:     2.10 cm   LV E/e' lateral: 9.7 LV SV:         99 LV SV Index:   51 LVOT Area:     3.46 cm  RIGHT VENTRICLE             IVC RV S prime:     12.40 cm/s  IVC diam: 3.10 cm TAPSE (M-mode): 2.6 cm LEFT ATRIUM             Index        RIGHT ATRIUM           Index LA diam:        4.80 cm 2.49 cm/m   RA Area:     11.70 cm LA Vol (  A2C):   42.3 ml 21.91 ml/m  RA Volume:   25.40 ml  13.16 ml/m LA Vol (A4C):   50.7 ml 26.26 ml/m LA Biplane Vol: 46.7 ml 24.19 ml/m  AORTIC VALVE AV Area (Vmax):    1.38 cm AV Area (Vmean):   1.35 cm AV Area (VTI):     1.22 cm AV Vmax:           339.50 cm/s AV Vmean:          234.000 cm/s AV VTI:            0.812 m AV Peak Grad:      46.1 mmHg AV Mean Grad:      28.0 mmHg LVOT Vmax:         135.00 cm/s LVOT Vmean:        91.000 cm/s LVOT VTI:          0.287 m LVOT/AV VTI ratio: 0.35 AI PHT:            404 msec  AORTA Ao Root diam: 4.20 cm Ao Asc diam:  3.90 cm MITRAL VALVE               TRICUSPID VALVE MV Area (PHT): 2.91 cm    TR Peak grad:   37.7 mmHg MV Decel Time: 261 msec    TR Vmax:        307.00 cm/s MV E velocity:  58.90 cm/s MV A velocity: 94.70 cm/s  SHUNTS MV E/A ratio:  0.62        Systemic VTI:  0.29 m                            Systemic Diam: 2.10 cm Cherlynn Kaiser MD Electronically signed by Cherlynn Kaiser MD Signature Date/Time: 09/10/2022/10:47:19 AM    Final      Scheduled Meds:  acetaminophen  650 mg Oral Daily   amLODipine  10 mg Oral Daily   amoxicillin-clavulanate  1 tablet Oral Q12H   aspirin  81 mg Oral Once per day on Mon Wed Fri   [START ON 09/12/2022] azithromycin  500 mg Oral Daily   docusate sodium  100 mg Oral BID   enoxaparin (LOVENOX) injection  40 mg Subcutaneous Daily   latanoprost  1 drop Both Eyes QHS   rosuvastatin  20 mg Oral Daily   Continuous Infusions:     LOS: 2 days   Time spent: [place], MD [facility] To contact the attending provider between 7A-7P or the covering provider during after hours 7P-7A, please log into the web site [email or web address] and access using universal [place] password for that web site. If you do not have the password, please call the hospital operator.  09/11/2022, 2:43 PM

## 2022-09-12 DIAGNOSIS — J189 Pneumonia, unspecified organism: Secondary | ICD-10-CM | POA: Diagnosis not present

## 2022-09-12 LAB — BASIC METABOLIC PANEL
Anion gap: 10 (ref 5–15)
BUN: 22 mg/dL (ref 8–23)
CO2: 26 mmol/L (ref 22–32)
Calcium: 8.6 mg/dL — ABNORMAL LOW (ref 8.9–10.3)
Chloride: 103 mmol/L (ref 98–111)
Creatinine, Ser: 1.18 mg/dL (ref 0.61–1.24)
GFR, Estimated: 59 mL/min — ABNORMAL LOW (ref >60)
Glucose, Bld: 100 mg/dL — ABNORMAL HIGH (ref 70–99)
Potassium: 3.8 mmol/L (ref 3.5–5.1)
Sodium: 139 mmol/L (ref 135–145)

## 2022-09-12 LAB — CBC WITH DIFFERENTIAL/PLATELET
Abs Immature Granulocytes: 0.03 10*3/uL (ref 0.00–0.07)
Basophils Absolute: 0.1 10*3/uL (ref 0.0–0.1)
Basophils Relative: 1 %
Eosinophils Absolute: 0.4 10*3/uL (ref 0.0–0.5)
Eosinophils Relative: 5 %
HCT: 31.4 % — ABNORMAL LOW (ref 39.0–52.0)
Hemoglobin: 10 g/dL — ABNORMAL LOW (ref 13.0–17.0)
Immature Granulocytes: 0 %
Lymphocytes Relative: 19 %
Lymphs Abs: 1.5 10*3/uL (ref 0.7–4.0)
MCH: 30.4 pg (ref 26.0–34.0)
MCHC: 31.8 g/dL (ref 30.0–36.0)
MCV: 95.4 fL (ref 80.0–100.0)
Monocytes Absolute: 1 10*3/uL (ref 0.1–1.0)
Monocytes Relative: 13 %
Neutro Abs: 4.8 10*3/uL (ref 1.7–7.7)
Neutrophils Relative %: 62 %
Platelets: 234 10*3/uL (ref 150–400)
RBC: 3.29 MIL/uL — ABNORMAL LOW (ref 4.22–5.81)
RDW: 13.1 % (ref 11.5–15.5)
WBC: 7.9 10*3/uL (ref 4.0–10.5)
nRBC: 0 % (ref 0.0–0.2)

## 2022-09-12 MED ORDER — AMOXICILLIN-POT CLAVULANATE 875-125 MG PO TABS: 1.0000 | ORAL_TABLET | Freq: Two times a day (BID) | ORAL | 0 refills | Status: AC

## 2022-09-12 MED ORDER — LISINOPRIL 20 MG PO TABS: 20.0000 mg | ORAL_TABLET | Freq: Every day | ORAL | Status: AC

## 2022-09-12 NOTE — Discharge Summary (Signed)
Physician Discharge Summary  KENYADA HOMEIER [id number] DOB: 15-Mar-1932 DOA: 09/09/2022  PCP: Lawerance Cruel, MD  Admit date: 09/09/2022 Discharge date: 09/12/2022  Time spent: 33 minutes  Recommendations for Outpatient Follow-up:  Complete circumscribe course of Augmentin Note dosage change of lisinopril, may need to consider keeping him at this dose Recommend outpatient CXR 1 month Recommend Chem-12 CBC 5 to 7 days CC PCP Dr. Melinda Crutch  Discharge Diagnoses:  MAIN problem for hospitalization   Community-acquired pneumonia NSTEMI secondary to above Chronic postherpetic neuralgia Prior DVT?-Cannot find records HFpEF chronic  Please see below for itemized issues addressed in [place]- refer to other progress notes for clarity if needed  Discharge Condition: Much improved  Diet recommendation: Heart healthy-would argue that would graduate to just a regular diet for him to keep up his calories-defer to PCP  [facility] Weights   09/10/22 0417 09/11/22 0500 09/12/22 0502  Weight: 73.7 kg 74.2 kg 74.9 kg    History of present illness:  89 year old white male coming from home (IADL ADL fully functional uses cane) Known history of OSA Postherpetic neuralgia Prostate cancer DVT on Eliquis Aortic stenosis chronic LBBB Myoview 2015 normal Weight at last OV 164 pounds / 74 kg Chronic pain syndrome follows with [facility] Dr. Burton Apley with pain generator mainly in the left shoulder left upper back on tramadol   Admit for weakness, illness and and found to have PNA Troponin was also elevated on admission but this is felt to be secondary to demand ischemia  Hospital Course:  SIRS criteria on admission 2/2 community-acquired pneumonia blood cultures 3/13 showing Strep Pyogenes - Changed to azithromycin ceftriaxone-ID auto-consulted, BC x2 repeat from 3/14 was if so we de-escalated to Augmentin and patient will complete a circumscribe course as per [facility] - All respiratory panels  were negative including respiratory viral panel this hospitalization and patient was de-escalated off of fluids   HFpEF - Continue ASA 81 - on discharge did resume lisinopril but cut back the dose to 40 mg - Continue amlodipine 10, Crestor 20, coenzyme Q 10   NSTEMI in setting of chronic LBBB Aortic stenosis-moderate ECHO this admit EF 55%, no mention WM anomaly - Case discussed with Dr. Eleonore Chiquito of Cardiology on 3/13 who recommends no further workup and overviewed the EKG on 3/13 -Functionally seems quite fit so may consider referral to cardiology but do not think this is necessary if no further chest pain   Postherpetic neuralgia - Continue lidocaine cream 4% 4 times daily as needed, Robaxin 500 every 6 as needed spasm discontinued Norco as he tells me he takes tramadol usually-I have resumed the tramadol -He will need to follow-up with his outpatient pain physician   DVT previously on Eliquis - no further Rx with ASA, unless determined appropriate by PCP   Prior prostate cancer history back in year 2000 status post prostatectomy per patient --Outpatient interval follow-up    Discharge Exam: Vitals:   09/12/22 0502 09/12/22 0932  BP: 132/60 (!) 144/69  Pulse: 63 66  Resp:  (!) 26  Temp: 98.7 F (37.1 C) 98.2 F (36.8 C)  SpO2: 98% 92%    Subj on day of d/c   Awake coherent no distress seems comfortable Sitting up with his wife in the bed having lunch   General Exam on discharge  Postop changes to eyes No icterus no pallor Bitemporal supraclavicular wasting Chest is clear no wheeze rales rhonchi S1-S2 no murmur on monitor is a sinus Abdomen is soft  no rebound No lower extremity edema Euthymic congruent coherent  Discharge Instructions   Discharge Instructions     Diet - low sodium heart healthy   Complete by: As directed    Discharge instructions   Complete by: As directed    Complete the course of antibiotics that you have been prescribed we have  called [facility] the [facility] I would recommend that you have your dose of your lisinopril for the time being-you had some dehydration related to your infection and also probably should be on lower dose of this regardless but please asked Dr. Alan Ripper opinion when you follow-up with him Remember that taking aspirin is now not in vogue.  The risks outweigh the benefits so please let Dr. Harrington Challenger know all of the meds that you are taking  Best of luck and have a good summer   Increase activity slowly   Complete by: As directed       Allergies as of 09/12/2022   No Known Allergies      Medication List     STOP taking these medications    aspirin 81 MG chewable tablet       TAKE these medications    acetaminophen 325 MG tablet Commonly known as: TYLENOL Take 650 mg by mouth daily.   amLODipine 10 MG tablet Commonly known as: NORVASC Take 10 mg by mouth daily.   amoxicillin-clavulanate 875-125 MG tablet Commonly known as: AUGMENTIN Take 1 tablet by mouth every 12 (twelve) hours for 2 days.   docusate sodium 100 MG capsule Commonly known as: COLACE Take 1 capsule (100 mg total) by mouth 2 (two) times daily.   latanoprost 0.005 % ophthalmic solution Commonly known as: XALATAN 1 drop at bedtime.   lidocaine 4 % cream Commonly known as: LMX Apply 1 Application topically 3 (three) times daily as needed.   lisinopril 20 MG tablet Commonly known as: ZESTRIL Take 1 tablet (20 mg total) by mouth daily. What changed:  medication strength how much to take   polyethylene glycol 17 g packet Commonly known as: MIRALAX / GLYCOLAX Take 17 g by mouth daily as needed for mild constipation.   rosuvastatin 20 MG tablet Commonly known as: CRESTOR Take 1 tablet (20 mg total) by mouth daily.   SYSTANE OP Place 1 drop into both eyes 2 (two) times daily as needed (dry eyes).   traMADol 50 MG tablet Commonly known as: ULTRAM Take 50 mg by mouth daily as needed.   Voltaren 1 %  Gel Generic drug: diclofenac Sodium Apply 2 g topically daily as needed (back pain).       No Known Allergies    The results of significant diagnostics from this hospitalization (including imaging, microbiology, ancillary and laboratory) are listed below for reference.    Significant Diagnostic Studies: ECHOCARDIOGRAM COMPLETE  Result Date: 09/10/2022    ECHOCARDIOGRAM REPORT   Patient Name:   Stephen Nunez Date of Exam: 09/10/2022 Medical Rec #:  [id number]       Height:       71.0 in Accession #:    [id number]      Weight:       162.5 lb Date of Birth:  [birth year]      BSA:          1.930 m Patient Age:    27 years        BP:           137/60 mmHg Patient Gender: M  HR:           61 bpm. Exam Location:  Inpatient Procedure: 2D Echo, Cardiac Doppler and Color Doppler Indications:    R07.9* Chest pain, unspecified. "Elevated troponin"  History:        Patient has prior history of Echocardiogram examinations, most                 recent 05/04/2022. Aortic Valve Disease; Risk                 Factors:Hypertension and Dyslipidemia. Cancer. Aortic stenosis.  Sonographer:    Roseanna Rainbow RDCS Referring Phys: [place]  1. Left ventricular ejection fraction, by estimation, is 55%. The left ventricle has normal function. The left ventricle has no regional wall motion abnormalities. There is moderate left ventricular hypertrophy. Left ventricular diastolic parameters are  consistent with Grade I diastolic dysfunction (impaired relaxation).  2. Right ventricular systolic function is normal. The right ventricular size is normal. There is moderately elevated pulmonary artery systolic pressure. The estimated right ventricular systolic pressure is 123456 mmHg.  3. Left atrial size was mildly dilated.  4. The mitral valve is degenerative. Trivial mitral valve regurgitation. No evidence of mitral stenosis.  5. Tricuspid valve regurgitation is moderate.  6. The aortic valve is  abnormal. There is severe calcifcation of the aortic valve. There is severe thickening of the aortic valve. Aortic valve regurgitation is mild. Moderate aortic valve stenosis. Aortic valve area, by VTI measures 1.22 cm. Aortic valve mean gradient measures 28.0 mmHg. Aortic valve Vmax measures 3.40 m/s.  7. Aortic dilatation noted. There is borderline dilatation of the aortic root, measuring 42 mm.  8. The inferior vena cava is dilated in size with <50% respiratory variability, suggesting right atrial pressure of 15 mmHg. FINDINGS  Left Ventricle: Left ventricular ejection fraction, by estimation, is 55%. The left ventricle has normal function. The left ventricle has no regional wall motion abnormalities. The left ventricular internal cavity size was normal in size. There is moderate left ventricular hypertrophy. Abnormal (paradoxical) septal motion, consistent with left bundle branch block. Left ventricular diastolic parameters are consistent with Grade I diastolic dysfunction (impaired relaxation). Right Ventricle: The right ventricular size is normal. No increase in right ventricular wall thickness. Right ventricular systolic function is normal. There is moderately elevated pulmonary artery systolic pressure. The tricuspid regurgitant velocity is 3.07 m/s, and with an assumed right atrial pressure of 15 mmHg, the estimated right ventricular systolic pressure is 123456 mmHg. Left Atrium: Left atrial size was mildly dilated. Right Atrium: Right atrial size was normal in size. Pericardium: There is no evidence of pericardial effusion. Mitral Valve: The mitral valve is degenerative in appearance. Mild to moderate mitral annular calcification. Trivial mitral valve regurgitation. No evidence of mitral valve stenosis. Tricuspid Valve: The tricuspid valve is grossly normal. Tricuspid valve regurgitation is moderate . No evidence of tricuspid stenosis. Aortic Valve: The aortic valve is abnormal. There is severe calcifcation  of the aortic valve. There is severe thickening of the aortic valve. Aortic valve regurgitation is mild. Aortic regurgitation PHT measures 404 msec. Moderate aortic stenosis is present. Aortic valve mean gradient measures 28.0 mmHg. Aortic valve peak gradient measures 46.1 mmHg. Aortic valve area, by VTI measures 1.22 cm. Pulmonic Valve: The pulmonic valve was not well visualized. Pulmonic valve regurgitation is trivial. No evidence of pulmonic stenosis. Aorta: Aortic dilatation noted. There is borderline dilatation of the aortic root, measuring 42 mm. Venous: The inferior vena cava is  dilated in size with less than 50% respiratory variability, suggesting right atrial pressure of 15 mmHg. IAS/Shunts: No atrial level shunt detected by color flow Doppler.  LEFT VENTRICLE PLAX 2D LVIDd:         4.35 cm   Diastology LVIDs:         3.40 cm   LV e' medial:    6.42 cm/s LV PW:         1.35 cm   LV E/e' medial:  9.2 LV IVS:        1.40 cm   LV e' lateral:   6.09 cm/s LVOT diam:     2.10 cm   LV E/e' lateral: 9.7 LV SV:         99 LV SV Index:   51 LVOT Area:     3.46 cm  RIGHT VENTRICLE             IVC RV S prime:     12.40 cm/s  IVC diam: 3.10 cm TAPSE (M-mode): 2.6 cm LEFT ATRIUM             Index        RIGHT ATRIUM           Index LA diam:        4.80 cm 2.49 cm/m   RA Area:     11.70 cm LA Vol (A2C):   42.3 ml 21.91 ml/m  RA Volume:   25.40 ml  13.16 ml/m LA Vol (A4C):   50.7 ml 26.26 ml/m LA Biplane Vol: 46.7 ml 24.19 ml/m  AORTIC VALVE AV Area (Vmax):    1.38 cm AV Area (Vmean):   1.35 cm AV Area (VTI):     1.22 cm AV Vmax:           339.50 cm/s AV Vmean:          234.000 cm/s AV VTI:            0.812 m AV Peak Grad:      46.1 mmHg AV Mean Grad:      28.0 mmHg LVOT Vmax:         135.00 cm/s LVOT Vmean:        91.000 cm/s LVOT VTI:          0.287 m LVOT/AV VTI ratio: 0.35 AI PHT:            404 msec  AORTA Ao Root diam: 4.20 cm Ao Asc diam:  3.90 cm MITRAL VALVE               TRICUSPID VALVE MV Area (PHT):  2.91 cm    TR Peak grad:   37.7 mmHg MV Decel Time: 261 msec    TR Vmax:        307.00 cm/s MV E velocity: 58.90 cm/s MV A velocity: 94.70 cm/s  SHUNTS MV E/A ratio:  0.62        Systemic VTI:  0.29 m                            Systemic Diam: 2.10 cm Cherlynn Kaiser MD Electronically signed by Cherlynn Kaiser MD Signature Date/Time: 09/10/2022/10:47:19 AM    Final    DG Chest 2 View  Result Date: 09/09/2022 CLINICAL DATA:  Pneumonia EXAM: CHEST - 2 VIEW COMPARISON:  10/30/2009 FINDINGS: Enlargement of cardiac silhouette. Mediastinal contours and pulmonary vascularity normal. Retrocardiac LEFT lower lobe infiltrate consistent with pneumonia.  Minimal atelectasis RIGHT base. Remaining lungs clear. No pleural effusion or pneumothorax. Osseous demineralization with degenerative changes thoracic spine. IMPRESSION: LEFT lower lobe infiltrate consistent with pneumonia. Subsegmental atelectasis RIGHT base. Electronically Signed   By: Lavonia Dana M.D.   On: 09/09/2022 09:39    Microbiology: Recent Results (from the past 240 hour(s))  Resp panel by RT-PCR (RSV, Flu A&B, Covid)     Status: None   Collection Time: 09/09/22  9:22 AM   Specimen: Nasal Swab  Result Value Ref Range Status   SARS Coronavirus 2 by RT PCR NEGATIVE NEGATIVE Final    Comment: (NOTE) SARS-CoV-2 target nucleic acids are NOT DETECTED.  The SARS-CoV-2 RNA is generally detectable in upper respiratory specimens during the acute phase of infection. The lowest concentration of SARS-CoV-2 viral copies this assay can detect is 138 copies/mL. A negative result does not preclude SARS-Cov-2 infection and should not be used as the sole basis for treatment or other patient management decisions. A negative result may occur with  improper specimen collection/handling, submission of specimen other than nasopharyngeal swab, presence of viral mutation(s) within the areas targeted by this assay, and inadequate number of viral copies(<138 copies/mL).  A negative result must be combined with clinical observations, patient history, and epidemiological information. The expected result is Negative.  Fact Sheet for Patients:  EntrepreneurPulse.com.au  Fact Sheet for Healthcare Providers:  IncredibleEmployment.be  This test is no t yet approved or cleared by the [place] FDA and  has been authorized for detection and/or diagnosis of SARS-CoV-2 by FDA under an Emergency Use Authorization (EUA). This EUA will remain  in effect (meaning this test can be used) for the duration of the COVID-19 declaration under Section 564(b)(1) of the Act, 21 U.S.C.section 360bbb-3(b)(1), unless the authorization is terminated  or revoked sooner.       Influenza A by PCR NEGATIVE NEGATIVE Final   Influenza B by PCR NEGATIVE NEGATIVE Final    Comment: (NOTE) The Xpert Xpress SARS-CoV-2/FLU/RSV plus assay is intended as an aid in the diagnosis of influenza from Nasopharyngeal swab specimens and should not be used as a sole basis for treatment. Nasal washings and aspirates are unacceptable for Xpert Xpress SARS-CoV-2/FLU/RSV testing.  Fact Sheet for Patients: EntrepreneurPulse.com.au  Fact Sheet for Healthcare Providers: IncredibleEmployment.be  This test is not yet approved or cleared by the [place] FDA and has been authorized for detection and/or diagnosis of SARS-CoV-2 by FDA under an Emergency Use Authorization (EUA). This EUA will remain in effect (meaning this test can be used) for the duration of the COVID-19 declaration under Section 564(b)(1) of the Act, 21 U.S.C. section 360bbb-3(b)(1), unless the authorization is terminated or revoked.     Resp Syncytial Virus by PCR NEGATIVE NEGATIVE Final    Comment: (NOTE) Fact Sheet for Patients: EntrepreneurPulse.com.au  Fact Sheet for Healthcare  Providers: IncredibleEmployment.be  This test is not yet approved or cleared by the [place] FDA and has been authorized for detection and/or diagnosis of SARS-CoV-2 by FDA under an Emergency Use Authorization (EUA). This EUA will remain in effect (meaning this test can be used) for the duration of the COVID-19 declaration under Section 564(b)(1) of the Act, 21 U.S.C. section 360bbb-3(b)(1), unless the authorization is terminated or revoked.  Performed at [facility], [place] [address] [place], [place] [address]   Culture, blood (single)     Status: None (Preliminary result)   Collection Time: 09/09/22  9:22 AM   Specimen: BLOOD  Result Value  Ref Range Status   Specimen Description   Final    BLOOD SITE NOT SPECIFIED Performed at [facility] [address], [place], [place] [address]    Special Requests   Final    BOTTLES DRAWN AEROBIC AND ANAEROBIC Blood Culture adequate volume Performed at [facility] [address], [place], [place] [address]    Culture  Setup Time   Final    GRAM POSITIVE COCCI ANAEROBIC BOTTLE ONLY CRITICAL RESULT CALLED TO, READ BACK BY AND VERIFIED WITH: M LILLISTON,PHARMD@0423  09/10/22 [place]    Culture   Final    GRAM POSITIVE COCCI IDENTIFICATION TO FOLLOW Performed at [facility], [place] [address], [place],  [address]    Report Status PENDING  Incomplete  Blood Culture ID Panel (Reflexed)     Status: Abnormal   Collection Time: 09/09/22  9:22 AM  Result Value Ref Range Status   Enterococcus faecalis NOT DETECTED NOT DETECTED Final   Enterococcus Faecium NOT DETECTED NOT DETECTED Final   Listeria monocytogenes NOT DETECTED NOT DETECTED Final   Staphylococcus species DETECTED (A) NOT DETECTED Final    Comment: CRITICAL RESULT CALLED TO, READ BACK BY AND VERIFIED WITH: M LILLISTON,PHARMD@0423  09/10/22 [place]    Staphylococcus aureus (BCID) NOT DETECTED NOT  DETECTED Final   Staphylococcus epidermidis DETECTED (A) NOT DETECTED Final    Comment: Methicillin (oxacillin) resistant coagulase negative staphylococcus. Possible blood culture contaminant (unless isolated from more than one blood culture draw or clinical case suggests pathogenicity). No antibiotic treatment is indicated for blood  culture contaminants. CRITICAL RESULT CALLED TO, READ BACK BY AND VERIFIED WITH: M LILLISTON,PHARMD@0423  09/10/22 [place]    Staphylococcus lugdunensis NOT DETECTED NOT DETECTED Final   Streptococcus species DETECTED (A) NOT DETECTED Final    Comment: CRITICAL RESULT CALLED TO, READ BACK BY AND VERIFIED WITH: M LILLISTON,PHARMD@0423  09/10/22 [place]    Streptococcus agalactiae NOT DETECTED NOT DETECTED Final   Streptococcus pneumoniae NOT DETECTED NOT DETECTED Final   Streptococcus pyogenes DETECTED (A) NOT DETECTED Final    Comment: CRITICAL RESULT CALLED TO, READ BACK BY AND VERIFIED WITH: M LILLISTON,PHARMD@0423  09/10/22 [place]    A.calcoaceticus-baumannii NOT DETECTED NOT DETECTED Final   Bacteroides fragilis NOT DETECTED NOT DETECTED Final   Enterobacterales NOT DETECTED NOT DETECTED Final   Enterobacter cloacae complex NOT DETECTED NOT DETECTED Final   Escherichia coli NOT DETECTED NOT DETECTED Final   Klebsiella aerogenes NOT DETECTED NOT DETECTED Final   Klebsiella oxytoca NOT DETECTED NOT DETECTED Final   Klebsiella pneumoniae NOT DETECTED NOT DETECTED Final   Proteus species NOT DETECTED NOT DETECTED Final   Salmonella species NOT DETECTED NOT DETECTED Final   Serratia marcescens NOT DETECTED NOT DETECTED Final   Haemophilus influenzae NOT DETECTED NOT DETECTED Final   Neisseria meningitidis NOT DETECTED NOT DETECTED Final   Pseudomonas aeruginosa NOT DETECTED NOT DETECTED Final   Stenotrophomonas maltophilia NOT DETECTED NOT DETECTED Final   Candida albicans NOT DETECTED NOT DETECTED Final   Candida auris NOT DETECTED NOT DETECTED Final   Candida  glabrata NOT DETECTED NOT DETECTED Final   Candida krusei NOT DETECTED NOT DETECTED Final   Candida parapsilosis NOT DETECTED NOT DETECTED Final   Candida tropicalis NOT DETECTED NOT DETECTED Final   Cryptococcus neoformans/gattii NOT DETECTED NOT DETECTED Final   Methicillin resistance mecA/C DETECTED (A) NOT DETECTED Final    Comment: CRITICAL RESULT CALLED TO, READ BACK BY AND VERIFIED WITH: M LILLISTON,PHARMD@0423  09/10/22 [place] Performed at [facility], [place] [address], [place], [place] [address]   Respiratory (~20 pathogens) panel by PCR     Status: None   Collection Time: 09/09/22 12:06 PM   Specimen: Nasopharyngeal Swab; Respiratory  Result Value Ref Range Status   Adenovirus NOT DETECTED NOT DETECTED Final   Coronavirus 229E NOT DETECTED NOT DETECTED Final    Comment: (NOTE) The Coronavirus on the Respiratory Panel, DOES NOT test for the novel  Coronavirus (2019 nCoV)    Coronavirus HKU1 NOT DETECTED NOT DETECTED Final   Coronavirus NL63 NOT DETECTED NOT DETECTED Final   Coronavirus OC43 NOT DETECTED NOT DETECTED Final   Metapneumovirus NOT DETECTED NOT DETECTED Final   Rhinovirus / Enterovirus NOT DETECTED NOT DETECTED Final   Influenza A NOT DETECTED NOT DETECTED Final   Influenza B NOT DETECTED NOT DETECTED Final   Parainfluenza Virus 1 NOT DETECTED NOT DETECTED Final   Parainfluenza Virus 2 NOT DETECTED NOT DETECTED Final   Parainfluenza Virus 3 NOT DETECTED NOT DETECTED Final   Parainfluenza Virus 4 NOT DETECTED NOT DETECTED Final   Respiratory Syncytial Virus NOT DETECTED NOT DETECTED Final   Bordetella pertussis NOT DETECTED NOT DETECTED Final   Bordetella Parapertussis NOT DETECTED NOT DETECTED Final   Chlamydophila pneumoniae NOT DETECTED NOT DETECTED Final   Mycoplasma pneumoniae NOT DETECTED NOT DETECTED Final    Comment: Performed at [facility], [place]. [address] [place], [place] [address]  Expectorated Sputum Assessment w Gram Stain, Rflx to Resp  Cult     Status: None   Collection Time: 09/09/22 12:06 PM   Specimen: Expectorated Sputum  Result Value Ref Range Status   Specimen Description EXPECTORATED SPUTUM  Final   Special Requests NONE  Final   Sputum evaluation   Final    Sputum specimen not acceptable for testing.  Please recollect.   Performed at [facility], [place] [address], [place], [place] [address]    Report Status 09/09/2022 FINAL  Final  Culture, blood (Routine X 2) w Reflex to ID Panel     Status: None (Preliminary result)   Collection Time: 09/10/22  5:29 AM   Specimen: BLOOD LEFT FOREARM  Result Value Ref Range Status   Specimen Description   Final    BLOOD LEFT FOREARM Performed at [facility], [place] [address] [place], [place] [address]    Special Requests   Final    BOTTLES DRAWN AEROBIC AND ANAEROBIC Blood Culture adequate volume Performed at [place] [address], [place], [place] [address]    Culture   Final    NO GROWTH 2 DAYS Performed at [facility] [address], [place], [place] [address]    Report Status PENDING  Incomplete  Culture, blood (Routine X 2) w Reflex to ID Panel     Status: None (Preliminary result)   Collection Time: 09/10/22  5:29 AM   Specimen: BLOOD LEFT FOREARM  Result Value Ref Range Status   Specimen Description   Final    BLOOD LEFT FOREARM Performed at [facility], [place] [address], [place], [place] [address]    Special Requests   Final    BOTTLES DRAWN AEROBIC AND ANAEROBIC Blood Culture adequate volume Performed at [facility] [address], [place], [place] [address]    Culture   Final    NO GROWTH 2 DAYS Performed at [place] [facility] [address], [place],  [address]  Report Status PENDING  Incomplete     Labs: Basic Metabolic Panel: Recent Labs  Lab 09/09/22 0922 09/10/22 0400 09/11/22 0448 09/12/22 0413  NA 141 141 138 139  K 3.4* 3.8 3.7 3.8  CL 107  104 107 103  CO2 25 26 26 26   GLUCOSE 104* 116* 99 100*  BUN 23 24* 24* 22  CREATININE 1.22 1.45* 1.28* 1.18  CALCIUM 8.1* 8.7* 8.3* 8.6*   Liver Function Tests: Recent Labs  Lab 09/09/22 0922 09/10/22 0400  AST 22 23  ALT 10 11  ALKPHOS 56 51  BILITOT 0.7 0.9  PROT 6.3* 6.1*  ALBUMIN 3.2* 3.2*   No results for input(s): "LIPASE", "AMYLASE" in the last 168 hours. No results for input(s): "AMMONIA" in the last 168 hours. CBC: Recent Labs  Lab 09/09/22 0922 09/10/22 0400 09/11/22 0448 09/12/22 0413  WBC 10.4 13.1* 9.9 7.9  NEUTROABS 8.5*  --  6.4 4.8  HGB 9.5* 9.9* 9.9* 10.0*  HCT 30.4* 31.5* 31.7* 31.4*  MCV 97.4 96.6 96.1 95.4  PLT 224 206 213 234   Cardiac Enzymes: No results for input(s): "CKTOTAL", "CKMB", "CKMBINDEX", "TROPONINI" in the last 168 hours. BNP: BNP (last 3 results) Recent Labs    09/09/22 0922  BNP 266.6*    ProBNP (last 3 results) Recent Labs    04/16/22 1413  PROBNP 677*    CBG: No results for input(s): "GLUCAP" in the last 168 hours.     Signed:  Nita Sells MD   [facility] 09/12/2022, 12:35 PM

## 2022-09-12 NOTE — Plan of Care (Signed)
  Problem: Respiratory: Goal: Ability to maintain adequate ventilation will improve Outcome: Adequate for Discharge   Problem: Activity: Goal: Ability to tolerate increased activity will improve Outcome: Adequate for Discharge   Problem: Activity: Goal: Risk for activity intolerance will decrease Outcome: Adequate for Discharge   Problem: Nutrition: Goal: Adequate nutrition will be maintained Outcome: Adequate for Discharge

## 2022-09-13 LAB — CULTURE, BLOOD (SINGLE): Special Requests: ADEQUATE

## 2022-09-15 LAB — CULTURE, BLOOD (ROUTINE X 2)
Culture: NO GROWTH
Culture: NO GROWTH
Special Requests: ADEQUATE
Special Requests: ADEQUATE

## 2022-09-18 DIAGNOSIS — J189 Pneumonia, unspecified organism: Secondary | ICD-10-CM | POA: Diagnosis not present

## 2022-09-18 DIAGNOSIS — Z6823 Body mass index (BMI) 23.0-23.9, adult: Secondary | ICD-10-CM | POA: Diagnosis not present

## 2022-09-18 DIAGNOSIS — Z09 Encounter for follow-up examination after completed treatment for conditions other than malignant neoplasm: Secondary | ICD-10-CM | POA: Diagnosis not present

## 2022-09-18 DIAGNOSIS — R899 Unspecified abnormal finding in specimens from other organs, systems and tissues: Secondary | ICD-10-CM | POA: Diagnosis not present

## 2022-09-28 DIAGNOSIS — D649 Anemia, unspecified: Secondary | ICD-10-CM | POA: Diagnosis not present

## 2022-09-28 DIAGNOSIS — N1831 Chronic kidney disease, stage 3a: Secondary | ICD-10-CM | POA: Diagnosis not present

## 2022-09-28 DIAGNOSIS — N481 Balanitis: Secondary | ICD-10-CM | POA: Diagnosis not present

## 2022-09-28 DIAGNOSIS — Z6823 Body mass index (BMI) 23.0-23.9, adult: Secondary | ICD-10-CM | POA: Diagnosis not present

## 2022-10-12 ENCOUNTER — Other Ambulatory Visit: Payer: Self-pay | Admitting: Family Medicine

## 2022-10-12 ENCOUNTER — Ambulatory Visit
Admission: RE | Admit: 2022-10-12 | Discharge: 2022-10-12 | Disposition: A | Discharge status: Home / Self Care | Payer: PPO | Source: Ambulatory Visit | Attending: Family Medicine | Admitting: Family Medicine

## 2022-10-12 DIAGNOSIS — Z8701 Personal history of pneumonia (recurrent): Secondary | ICD-10-CM

## 2022-10-12 DIAGNOSIS — M5134 Other intervertebral disc degeneration, thoracic region: Secondary | ICD-10-CM | POA: Diagnosis not present

## 2022-12-02 DIAGNOSIS — H401233 Low-tension glaucoma, bilateral, severe stage: Secondary | ICD-10-CM | POA: Diagnosis not present

## 2022-12-10 DIAGNOSIS — B0229 Other postherpetic nervous system involvement: Secondary | ICD-10-CM | POA: Diagnosis not present

## 2022-12-10 DIAGNOSIS — Z79899 Other long term (current) drug therapy: Secondary | ICD-10-CM | POA: Diagnosis not present

## 2022-12-22 ENCOUNTER — Telehealth: Payer: Self-pay | Admitting: Internal Medicine

## 2022-12-22 NOTE — Telephone Encounter (Signed)
Patient's wife is requesting provider switch from Dr. Tenny Craw to Dr. Cristal Deer due to location.

## 2022-12-22 NOTE — Telephone Encounter (Signed)
Ok by me

## 2023-01-22 ENCOUNTER — Ambulatory Visit (HOSPITAL_COMMUNITY): Payer: PPO | Attending: Cardiovascular Disease

## 2023-01-22 DIAGNOSIS — I350 Nonrheumatic aortic (valve) stenosis: Secondary | ICD-10-CM | POA: Insufficient documentation

## 2023-01-22 LAB — ECHOCARDIOGRAM COMPLETE
AR max vel: 0.69 cm2
AV Area VTI: 0.72 cm2
AV Area mean vel: 0.66 cm2
AV Mean grad: 22 mmHg
AV Peak grad: 38.7 mmHg
Ao pk vel: 3.11 m/s
Area-P 1/2: 3.17 cm2
P 1/2 time: 356 msec
S' Lateral: 3.8 cm

## 2023-01-25 ENCOUNTER — Telehealth: Payer: Self-pay

## 2023-01-25 NOTE — Telephone Encounter (Signed)
-----   Message from [place] sent at 01/23/2023  5:30 AM EDT ----- Echo shows pumping function of heart is normal Aortic stenosis (narrowing of the valve ) remains moderate  Aortic regurgitation is mild Will keep following

## 2023-01-25 NOTE — Telephone Encounter (Signed)
Spoke with patient to review ECHO results and answer questions. Patient verbalized understanding and had no questions.

## 2023-02-04 ENCOUNTER — Ambulatory Visit: Payer: PPO | Admitting: Internal Medicine

## 2023-02-17 NOTE — Progress Notes (Signed)
Cardiology Office Note:  .    Date:  02/18/2023  ID:  Stephen Nunez, DOB 1931/08/03, MRN [id number] PCP: Daisy Floro, MD  [facility] Providers Cardiologist:  Jodelle Red, MD     History of Present Illness: .    Stephen Nunez is a 89 y.o. male with a hx of HFpEF, aortic stenosis, chronic LBBB, hypertension, hyperlipidemia, DVT, OSA, prostate cancer s/p prostatectomy (2000), post herpetic neuralgia, pneumonia, here for follow-up and to establish care with me. He is a former patient of Dr. Tenny Craw, last seen by her 03/2022. At that visit he complained of some LE swelling over a couple months. Noted to be on amlodipine for years without significant issues previously. Labs showed normal electrolytes, thyroid and kidney function. He was mildly anemic, Hgb 11.2.  In 08/2022 he was admitted to the hospital with community-acquired pneumonia. Troponin was elevated on admission, felt to be secondary to demand ischemia. On discharge, lisinopril was resumed at a lower dose of 20 mg. His 81 mg ASA was discontinued.  Cardiovascular risk factors: Prior clinical ASCVD:  Moderate aortic stenosis. No chest pain, no shortness of breath. No palpitations. Comorbid conditions:  Hypertension - in the office his blood pressure is 175/67 initially, and 154/72 on manual repeat. Current regimen lisinopril 20 mg. They report that his 10 mg amlodipine had been discontinued by Dr. Tenny Craw due to swelling which improved after stopping the medication. Hyperlipidemia - on rosuvastatin 20 mg. Metabolic syndrome/Obesity:  Current weight 168 lbs. Chronic inflammatory conditions: Arthritis. He also confirms struggling with chronic pain for years due to shingles/post-herpetic neuralgia. Localized to his left back/shoulder. Hasn't kept him from sleeping but does persist throughout the day. Follows with the pain clinic and typically takes two 325 mg tylenol in the mornings.  Tobacco use history:  Never Family  history: Not discussed Prior pertinent testing and/or incidental findings: Echo 12/2022 with LVEF 50-55%, mild LVH, grade 1 diastolic dysfunction, septal dyssynchrony consistent with bundle branch block, mildly elevated pulmonary artery systolic pressure, trivial mitral regurgitation, mild to moderate aortic regurgitation, moderate aortic valve stenosis. Normal myoview in 2015. Exercise level: Stays active with regular shopping, travels a lot. Uses a stair-stepper at least a couple times per week.  Current diet: Not discussed  Today, he is accompanied by a family member. He denies any lightheadedness, headaches, syncope, orthopnea, or PND.  ROS:  Please see the history of present illness. ROS otherwise negative except as noted.  (+) Left back and left shoulder pain, chronic  Studies Reviewed: .         Echo  01/22/2023:  1. Septal dyssynchrony consistent with bundle branch block. Left  ventricular ejection fraction, by estimation, is 50 to 55%. The left  ventricle has low normal function. The left ventricle has no regional wall  motion abnormalities. There is mild  concentric left ventricular hypertrophy. Left ventricular diastolic  parameters are consistent with Grade I diastolic dysfunction (impaired  relaxation).   2. Right ventricular systolic function is normal. The right ventricular  size is normal. There is mildly elevated pulmonary artery systolic  pressure.   3. Left atrial size was moderately dilated.   4. The mitral valve is normal in structure. Trivial mitral valve  regurgitation. No evidence of mitral stenosis.   5. The aortic valve is tricuspid. There is moderate calcification of the  aortic valve. There is moderate thickening of the aortic valve. Aortic  valve regurgitation is mild to moderate. Moderate aortic valve stenosis.  Aortic regurgitation PHT measures  356 msec. Aortic valve area, by VTI measures 0.72 cm. Aortic valve mean  gradient measures 22.0 mmHg. Aortic  valve Vmax measures 3.11 m/s.   6. The inferior vena cava is dilated in size with <50% respiratory  variability, suggesting right atrial pressure of 15 mmHg.    Physical Exam:    VS:  BP (!) 154/72 (BP Location: Right Arm, Patient Position: Sitting, Cuff Size: Normal)   Pulse 72   Ht 5\' 11"  (1.803 m)   Wt 168 lb 1.6 oz (76.2 kg)   SpO2 98%   BMI 23.45 kg/m    Wt Readings from Last 3 Encounters:  02/18/23 168 lb 1.6 oz (76.2 kg)  09/12/22 165 lb 2 oz (74.9 kg)  04/16/22 164 lb 12.8 oz (74.8 kg)    GEN: Well nourished, well developed in no acute distress HEENT: Normal, moist mucous membranes NECK: No JVD CARDIAC: regular rhythm, normal S1 and S2, no rubs or gallops. 3/6 early to mid peaking aortic valve murmur with preserved S2 VASCULAR: Radial and DP pulses 2+ bilaterally. No carotid bruits RESPIRATORY:  Clear to auscultation without rales, wheezing or rhonchi  ABDOMEN: Soft, non-tender, non-distended MUSCULOSKELETAL:  Ambulates independently SKIN: Warm and dry, no pitting edema but some chronic venous stasis changes NEUROLOGIC:  Alert and oriented x 3. No focal neuro deficits noted. PSYCHIATRIC:  Normal affect   ASSESSMENT AND PLAN: .    Hypertension -amlodipine 10 mg daily was stopped by PCP for leg swelling -check home BP. If elevated, could consider trial of 5 mg amlodipine dose. Could also increase lisinopril or change to ARB, would need to monitor kidneys/potassium closely givn age  Aortic stenosis, moderate -repeat echo in 6 mos -reviewed symptoms to watch for  LBBB -chronic  Hyperlipidemia -continue rosuvastatin   Dispo: Follow-up with Gillian Shields, NP in 4-6 weeks. Follow-up with me in 6 months following repeat Echo, or sooner as needed.  I,Mathew Stumpf,acting as a Neurosurgeon for [employer], MD.,have documented all relevant documentation on the behalf of Jodelle Red, MD,as directed by  Jodelle Red, MD while in the presence of  Jodelle Red, MD.  I, Jodelle Red, MD, have reviewed all documentation for this visit. The documentation on 02/18/23 for the exam, diagnosis, procedures, and orders are all accurate and complete.   Signed, Jodelle Red, MD

## 2023-02-18 ENCOUNTER — Encounter (HOSPITAL_BASED_OUTPATIENT_CLINIC_OR_DEPARTMENT_OTHER): Payer: Self-pay | Admitting: Cardiology

## 2023-02-18 ENCOUNTER — Ambulatory Visit (HOSPITAL_BASED_OUTPATIENT_CLINIC_OR_DEPARTMENT_OTHER): Payer: PPO | Admitting: Cardiology

## 2023-02-18 VITALS — BP 154/72 | HR 72 | Ht 71.0 in | Wt 168.1 lb

## 2023-02-18 DIAGNOSIS — I10 Essential (primary) hypertension: Secondary | ICD-10-CM | POA: Diagnosis not present

## 2023-02-18 DIAGNOSIS — I447 Left bundle-branch block, unspecified: Secondary | ICD-10-CM | POA: Diagnosis not present

## 2023-02-18 DIAGNOSIS — E7800 Pure hypercholesterolemia, unspecified: Secondary | ICD-10-CM

## 2023-02-18 DIAGNOSIS — I350 Nonrheumatic aortic (valve) stenosis: Secondary | ICD-10-CM

## 2023-02-18 NOTE — Patient Instructions (Addendum)
Medication Instructions:  Your physician recommends that you continue on your current medications as directed. Please refer to the Current Medication list given to you today.  *If you need a refill on your cardiac medications before your next appointment, please call your pharmacy*  Lab Work: NONE  Testing/Procedures: Your physician has requested that you have an echocardiogram. Echocardiography is a painless test that uses sound waves to create images of your heart. It provides your doctor with information about the size and shape of your heart and how well your heart's chambers and valves are working. This procedure takes approximately one hour. There are no restrictions for this procedure. Please do NOT wear cologne, perfume, aftershave, or lotions (deodorant is allowed). Please arrive 15 minutes prior to your appointment time.  TO BE DONE IN 6 MONTHS ABOUT A WEEK PRIOR TO FOLLOW UP  Follow-Up: At [facility], you and your health needs are our priority.  As part of our continuing mission to provide you with exceptional heart care, we have created designated Provider Care Teams.  These Care Teams include your primary Cardiologist (physician) and Advanced Practice Providers (APPs -  Physician Assistants and Nurse Practitioners) who all work together to provide you with the care you need, when you need it.  We recommend signing up for the patient portal called "MyChart".  Sign up information is provided on this After Visit Summary.  MyChart is used to connect with patients for Virtual Visits (Telemedicine).  Patients are able to view lab/test results, encounter notes, upcoming appointments, etc.  Non-urgent messages can be sent to your provider as well.   To learn more about what you can do with MyChart, go to [email or web address].    Your next appointment:   ABOUT A WEEK AFTER ECHO IN 6 month(s)  The format for your next appointment:   In Person  Provider:   Jodelle Red, MD    Ronn Melena NP IN 4-6 WEEKS   MONITOR AND LOG YOUR BLOOD PRESSURE DAILY. BRING YOUR READINGS AND MACHINE TO FOLLOW UP  how to check blood pressure:  -sit comfortably in a chair, feet uncrossed and flat on floor, for 5-10 minutes  -arm ideally should rest at the level of the heart. However, arm should be relaxed and not tense (for example, do not hold the arm up unsupported)  -avoid exercise, caffeine, and tobacco for at least 30 minutes prior to BP reading  -don't take BP cuff reading over clothes (always place on skin directly)  -I prefer to know how well the medication is working, so I would like you to take your readings 1-2 hours after taking your blood pressure medication if possible   Watch for shortness of breath/fluid, chest pain, and passing out.

## 2023-02-23 DIAGNOSIS — D649 Anemia, unspecified: Secondary | ICD-10-CM | POA: Diagnosis not present

## 2023-02-23 DIAGNOSIS — Z23 Encounter for immunization: Secondary | ICD-10-CM | POA: Diagnosis not present

## 2023-02-23 DIAGNOSIS — S40811A Abrasion of right upper arm, initial encounter: Secondary | ICD-10-CM | POA: Diagnosis not present

## 2023-02-23 DIAGNOSIS — I10 Essential (primary) hypertension: Secondary | ICD-10-CM | POA: Diagnosis not present

## 2023-02-23 DIAGNOSIS — E7800 Pure hypercholesterolemia, unspecified: Secondary | ICD-10-CM | POA: Diagnosis not present

## 2023-02-23 DIAGNOSIS — Z8546 Personal history of malignant neoplasm of prostate: Secondary | ICD-10-CM | POA: Diagnosis not present

## 2023-03-03 DIAGNOSIS — Z0000 Encounter for general adult medical examination without abnormal findings: Secondary | ICD-10-CM | POA: Diagnosis not present

## 2023-03-03 DIAGNOSIS — Z8546 Personal history of malignant neoplasm of prostate: Secondary | ICD-10-CM | POA: Diagnosis not present

## 2023-03-03 DIAGNOSIS — D649 Anemia, unspecified: Secondary | ICD-10-CM | POA: Diagnosis not present

## 2023-03-03 DIAGNOSIS — N1831 Chronic kidney disease, stage 3a: Secondary | ICD-10-CM | POA: Diagnosis not present

## 2023-03-03 DIAGNOSIS — Z23 Encounter for immunization: Secondary | ICD-10-CM | POA: Diagnosis not present

## 2023-03-03 DIAGNOSIS — I77810 Thoracic aortic ectasia: Secondary | ICD-10-CM | POA: Diagnosis not present

## 2023-03-03 DIAGNOSIS — Z9989 Dependence on other enabling machines and devices: Secondary | ICD-10-CM | POA: Diagnosis not present

## 2023-03-03 DIAGNOSIS — Z6826 Body mass index (BMI) 26.0-26.9, adult: Secondary | ICD-10-CM | POA: Diagnosis not present

## 2023-03-03 DIAGNOSIS — I10 Essential (primary) hypertension: Secondary | ICD-10-CM | POA: Diagnosis not present

## 2023-03-03 DIAGNOSIS — E7800 Pure hypercholesterolemia, unspecified: Secondary | ICD-10-CM | POA: Diagnosis not present

## 2023-03-25 DIAGNOSIS — Z79899 Other long term (current) drug therapy: Secondary | ICD-10-CM | POA: Diagnosis not present

## 2023-03-25 DIAGNOSIS — B0229 Other postherpetic nervous system involvement: Secondary | ICD-10-CM | POA: Diagnosis not present

## 2023-05-03 DIAGNOSIS — L2089 Other atopic dermatitis: Secondary | ICD-10-CM | POA: Diagnosis not present

## 2023-05-03 DIAGNOSIS — L218 Other seborrheic dermatitis: Secondary | ICD-10-CM | POA: Diagnosis not present

## 2023-05-03 DIAGNOSIS — L821 Other seborrheic keratosis: Secondary | ICD-10-CM | POA: Diagnosis not present

## 2023-05-03 DIAGNOSIS — L710 Perioral dermatitis: Secondary | ICD-10-CM | POA: Diagnosis not present

## 2023-05-03 DIAGNOSIS — D692 Other nonthrombocytopenic purpura: Secondary | ICD-10-CM | POA: Diagnosis not present

## 2023-05-03 DIAGNOSIS — B0229 Other postherpetic nervous system involvement: Secondary | ICD-10-CM | POA: Diagnosis not present

## 2023-05-03 DIAGNOSIS — L853 Xerosis cutis: Secondary | ICD-10-CM | POA: Diagnosis not present

## 2023-05-26 DIAGNOSIS — L989 Disorder of the skin and subcutaneous tissue, unspecified: Secondary | ICD-10-CM | POA: Diagnosis not present

## 2023-05-26 DIAGNOSIS — R413 Other amnesia: Secondary | ICD-10-CM | POA: Diagnosis not present

## 2023-05-26 DIAGNOSIS — B0229 Other postherpetic nervous system involvement: Secondary | ICD-10-CM | POA: Diagnosis not present

## 2023-06-07 ENCOUNTER — Telehealth: Payer: Self-pay

## 2023-06-07 NOTE — Telephone Encounter (Signed)
I owuld prefer him to stay with Dr. Marjory Lies. Dr. Marjory Lies is just as knowlegedable as I am I fnot more in dementia. thanks

## 2023-06-07 NOTE — Telephone Encounter (Signed)
We received a new referral from this patients PCP. They are being referred over for memory concerns. They had previously seen Dr Marjory Lies in 2021 and are now wanting to see Dr Lucia Gaskins at the recommendation of Dr Tenny Craw.   Can you please advise if this switch is acceptable?  Thanks!

## 2023-06-10 DIAGNOSIS — R197 Diarrhea, unspecified: Secondary | ICD-10-CM | POA: Diagnosis not present

## 2023-08-17 ENCOUNTER — Ambulatory Visit (HOSPITAL_BASED_OUTPATIENT_CLINIC_OR_DEPARTMENT_OTHER): Payer: HMO

## 2023-08-17 DIAGNOSIS — I350 Nonrheumatic aortic (valve) stenosis: Secondary | ICD-10-CM

## 2023-08-17 LAB — ECHOCARDIOGRAM COMPLETE
AR max vel: 0.92 cm2
AV Area VTI: 0.96 cm2
AV Area mean vel: 0.85 cm2
AV Mean grad: 16 mm[Hg]
AV Peak grad: 28.9 mm[Hg]
Ao pk vel: 2.69 m/s
Area-P 1/2: 2.99 cm2
P 1/2 time: 494 ms
S' Lateral: 2.36 cm

## 2023-08-18 ENCOUNTER — Ambulatory Visit (HOSPITAL_BASED_OUTPATIENT_CLINIC_OR_DEPARTMENT_OTHER): Payer: PPO | Admitting: Cardiology

## 2023-09-15 DIAGNOSIS — H401232 Low-tension glaucoma, bilateral, moderate stage: Secondary | ICD-10-CM | POA: Diagnosis not present

## 2023-09-20 ENCOUNTER — Encounter: Payer: Self-pay | Admitting: Diagnostic Neuroimaging

## 2023-09-20 ENCOUNTER — Ambulatory Visit: Payer: PPO | Admitting: Diagnostic Neuroimaging

## 2023-09-20 VITALS — BP 115/51 | HR 73 | Ht 71.0 in | Wt 174.0 lb

## 2023-09-20 DIAGNOSIS — G3184 Mild cognitive impairment, so stated: Secondary | ICD-10-CM | POA: Diagnosis not present

## 2023-09-20 NOTE — Progress Notes (Signed)
 [facility]  PATIENT: Stephen Nunez DOB: December 18, 1931  REFERRING CLINICIAN: Daisy Floro, MD HISTORY FROM: patient and wife  REASON FOR VISIT: new consult    HISTORICAL  CHIEF COMPLAINT:  Chief Complaint  Patient presents with   Follow-up    Patient in room #7 with his wife. Patient wife states they sre here to discuss patient memory issues.    HISTORY OF PRESENT ILLNESS:   UPDATE (09/20/23, VRP): Since last visit, doing about the same with tremor and post-herpetic neuralgia. Here for concern about short term memory loss per wife (appointments, dates), noted since past 1 year. No major changes in ADLs.  PRIOR HPI (07/04/19, VRP): 89 year old male here for evaluation of tremor.  Patient has had left arm tremor since 2014.  Symptoms occur mainly with holding objects and certain actions.  He has mild tremor in his right arm as well.  No family history of tremor.  No head tremor.  No resting tremor.  No change in speech or swallowing.  He has some mild balance difficulty.  Patient has been tried on propranolol 80 mg daily, increase to 100 mg daily, for 1 to 2 months without relief.  Patient has stopped this medication for past few weeks.  Patient has history of severe herpes zoster affecting the left thoracic dermatomes T4-T12, and left upper arm in 2000.  He struggled with chronic pain related to this.  He was treated with variety of medications including pain stimulator implantation.   REVIEW OF SYSTEMS: Full 14 system review of systems performed and negative with exception of: As per HPI.  ALLERGIES: No Known Allergies   HOME MEDICATIONS: Outpatient Medications Prior to Visit  Medication Sig Dispense Refill   acetaminophen (TYLENOL) 325 MG tablet Take 650 mg by mouth daily.     aspirin EC 81 MG tablet Take 81 mg by mouth every 4 (four) hours as needed.     Cholecalciferol 50 MCG (2000 UT) TABS Take 2,000 Units by mouth daily.     co-enzyme Q-10 30 MG  capsule Take 200 mg by mouth 3 (three) times daily.     cyanocobalamin (VITAMIN B12) 500 MCG tablet Take 500 mcg by mouth daily.     diclofenac Sodium (VOLTAREN) 1 % GEL Apply 2 g topically daily as needed (back pain).     furosemide (LASIX) 20 MG tablet Take 20 mg by mouth daily.     HYDROcodone-acetaminophen (NORCO/VICODIN) 5-325 MG tablet Take 2 tablets by mouth every 4 (four) hours as needed.     latanoprost (XALATAN) 0.005 % ophthalmic solution 1 drop at bedtime.     lidocaine (LMX) 4 % cream Apply 1 Application topically 3 (three) times daily as needed.     lisinopril (ZESTRIL) 20 MG tablet Take 1 tablet (20 mg total) by mouth daily.     Polyethyl Glycol-Propyl Glycol (SYSTANE OP) Place 1 drop into both eyes 2 (two) times daily as needed (dry eyes).     polyethylene glycol (MIRALAX / GLYCOLAX) 17 g packet Take 17 g by mouth daily as needed for mild constipation. 14 each 0   Probiotic Product (ALIGN) 4 MG CAPS Take 4 mg by mouth daily.     rosuvastatin (CRESTOR) 20 MG tablet Take 1 tablet (20 mg total) by mouth daily. 90 tablet 3   traMADol (ULTRAM) 50 MG tablet Take 50 mg by mouth daily as needed.     TURMERIC EXTRA STRENGTH PO Take 500 mg by mouth 2 (two) times a  week.     vitamin E 180 MG (400 UNITS) capsule Take 400 Units by mouth daily.     No facility-administered medications prior to visit.    PAST MEDICAL HISTORY: Past Medical History:  Diagnosis Date   Aortic stenosis    Arthritis    Bell's palsy    mild, left   Heart murmur    History of shingles    2000   Hyperlipidemia    Hypertension    OSA (obstructive sleep apnea)    Pneumonia    as an infant   Post herpetic neuralgia    Prostate cancer    prostate cancer   Tremor     PAST SURGICAL HISTORY: Past Surgical History:  Procedure Laterality Date   COLONOSCOPY     HERNIA REPAIR     INGUINAL HERNIA REPAIR Right 09/07/2017   Procedure: OPEN REPAIR RIGHT INGUINAL HERNIA;  Surgeon: Darnell Level, MD;  Location: [facility]  ORS;  Service: General;  Laterality: Right;   INSERTION OF MESH Right 09/07/2017   Procedure: INSERTION OF MESH;  Surgeon: Darnell Level, MD;  Location: [facility] ORS;  Service: General;  Laterality: Right;   PROSTATE SURGERY     SPINAL CORD STIMULATOR INSERTION N/A 04/26/2015   Procedure: LUMBAR SPINAL CORD STIMULATOR INSERTION;  Surgeon: Odette Fraction, MD;  Location: [facility];  Service: Neurosurgery;  Laterality: N/A;   SPINAL CORD STIMULATOR REMOVAL N/A 05/15/2016   Procedure: THORACIC SPINAL CORD STIMULATOR REMOVAL;  Surgeon: Odette Fraction, MD;  Location: [facility];  Service: Neurosurgery;  Laterality: N/A;  THORACIC SPINAL CORD STIMULATOR REMOVAL   TONSILLECTOMY     TOTAL KNEE ARTHROPLASTY Left 07/01/2021   Procedure: TOTAL KNEE ARTHROPLASTY;  Surgeon: Durene Romans, MD;  Location: [facility] ORS;  Service: Orthopedics;  Laterality: Left;    FAMILY HISTORY: Family History  Problem Relation Age of Onset   Heart attack Father    Other Mother        Post-op complications, deceased 26   Cancer Mother    Heart attack Brother        Deceased 39   Stroke Brother    Multiple sclerosis Brother        Deceased, 64    SOCIAL HISTORY: Social History   Socioeconomic History   Marital status: Married    Spouse name: Darel Hong   Number of children: 0   Years of education: Not on file   Highest education level: Not on file  Occupational History   Occupation: retired  Tobacco Use   Smoking status: Never   Smokeless tobacco: Never  Vaping Use   Vaping status: Never Used  Substance and Sexual Activity   Alcohol use: No   Drug use: No   Sexual activity: Not on file  Other Topics Concern   Not on file  Social History Narrative   He lives with wife.   Retired from working in Designer, fashion/clothing.   No caffeine         Social Drivers of Corporate investment banker Strain: Not on file  Food Insecurity: No Food Insecurity (09/10/2022)   Hunger Vital Sign    Worried About Running Out of Food in the Last Year: Never  true    Ran Out of Food in the Last Year: Never true  Transportation Needs: No Transportation Needs (09/10/2022)   PRAPARE - Administrator, Civil Service (Medical): No    Lack of Transportation (Non-Medical): No  Physical Activity: Not on file  Stress:  Not on file  Social Connections: Not on file  Intimate Partner Violence: Not At Risk (09/10/2022)   Humiliation, Afraid, Rape, and Kick questionnaire    Fear of Current or Ex-Partner: No    Emotionally Abused: No    Physically Abused: No    Sexually Abused: No     PHYSICAL EXAM  GENERAL EXAM/CONSTITUTIONAL: Vitals:  Vitals:   09/20/23 1117  BP: (!) 115/51  Pulse: 73  Weight: 174 lb (78.9 kg)  Height: 5\' 11"  (1.803 m)   Body mass index is 24.27 kg/m. Wt Readings from Last 3 Encounters:  09/20/23 174 lb (78.9 kg)  02/18/23 168 lb 1.6 oz (76.2 kg)  09/12/22 165 lb 2 oz (74.9 kg)   Patient is in no distress; well developed, nourished and groomed; neck is supple  CARDIOVASCULAR: Regular rate and rhythm; SYSTOLIC MURMUR RADIATING TO RIGHT CAROTID Examination of peripheral vascular system by observation and palpation is normal  EYES: Ophthalmoscopic exam of optic discs and posterior segments is normal; no papilledema or hemorrhages No results found.  MUSCULOSKELETAL: Gait, strength, tone, movements noted in Neurologic exam below  NEUROLOGIC: MENTAL STATUS:     09/20/2023   11:21 AM  MMSE - Mini Mental State Exam  Orientation to time 3  Orientation to Place 5  Registration 3  Attention/ Calculation 5  Recall 3  Language- name 2 objects 2  Language- repeat 1  Language- follow 3 step command 3  Language- read & follow direction 1  Write a sentence 1  Copy design 1  Total score 28   awake, alert, oriented to person, place and time recent and remote memory intact normal attention and concentration language fluent, comprehension intact, naming intact fund of knowledge appropriate  CRANIAL NERVE:   2nd - no papilledema on fundoscopic exam 2nd, 3rd, 4th, 6th - pupils equal and reactive to light, visual fields full to confrontation; RIGHT EYE AMBLYOPIA TURNED INWARD; LEFT EYE DOMINANT; RIGHT EYE MOVEMENTS LIMITED; LEFT EYE ABDUCTION SLIGHTLY LIMITED 5th - facial sensation symmetric 7th - facial strength symmetric 8th - hearing intact 9th - palate elevates symmetrically, uvula midline 11th - shoulder shrug symmetric 12th - tongue protrusion midline  MOTOR:  MODERATE POSTURAL AND ACTION TREMOR IN BUE (LEFT > RIGHT) NO BRADYKINESIA NO RIGIDITY normal bulk and tone, full strength in the BUE, BLE  SENSORY:  normal and symmetric to light touch, temperature, vibration  COORDINATION:  finger-nose-finger, fine finger movements normal  REFLEXES:  deep tendon reflexes 1+ and symmetric  GAIT/STATION:  STOOPED POSTURE; SLOW AND SLIGHTLY UNSTEADY    DIAGNOSTIC DATA (LABS, IMAGING, TESTING) - I reviewed patient records, labs, notes, testing and imaging myself where available.  Lab Results  Component Value Date   WBC 7.9 09/12/2022   HGB 10.0 (L) 09/12/2022   HCT 31.4 (L) 09/12/2022   MCV 95.4 09/12/2022   PLT 234 09/12/2022      Component Value Date/Time   NA 139 09/12/2022 0413   NA 142 04/16/2022 1413   K 3.8 09/12/2022 0413   CL 103 09/12/2022 0413   CO2 26 09/12/2022 0413   GLUCOSE 100 (H) 09/12/2022 0413   BUN 22 09/12/2022 0413   BUN 30 (H) 04/16/2022 1413   CREATININE 1.18 09/12/2022 0413   CALCIUM 8.6 (L) 09/12/2022 0413   PROT 6.1 (L) 09/10/2022 0400   PROT 6.7 10/28/2020 1009   ALBUMIN 3.2 (L) 09/10/2022 0400   ALBUMIN 4.3 10/28/2020 1009   AST 23 09/10/2022 0400   ALT 11 09/10/2022  0400   ALKPHOS 51 09/10/2022 0400   BILITOT 0.9 09/10/2022 0400   BILITOT 0.7 10/28/2020 1009   GFRNONAA 59 (L) 09/12/2022 0413   GFRAA 59 (L) 09/02/2017 1117   Lab Results  Component Value Date   CHOL 149 10/28/2020   HDL 60 10/28/2020   LDLCALC 73 10/28/2020   TRIG 82  10/28/2020   CHOLHDL 2.5 10/28/2020   No results found for: "HGBA1C" No results found for: "VITAMINB12" Lab Results  Component Value Date   TSH 2.718 09/09/2022    02/08/19 TTE 1. The left ventricle has low normal systolic function, with an ejection fraction of 50-55%. The cavity size was normal. There is moderate concentric left ventricular hypertrophy. Left ventricular diastolic Doppler parameters are consistent with  impaired relaxation. There is abnormal septal motion consistent with left bundle branch block. No evidence of left ventricular regional wall motion abnormalities.  2. The right ventricle has normal systolic function. The cavity was normal. There is no increase in right ventricular wall thickness. Right ventricular systolic pressure is mildly elevated with an estimated pressure of 38.9 mmHg.  3. There is mild mitral annular calcification present.  4. Tricuspid valve regurgitation is mild-moderate.  5. The aortic valve is tricuspid. Severely thickening of the aortic valve. Moderate calcification of the aortic valve. Aortic valve regurgitation is mild to moderate by color flow Doppler. Moderate stenosis of the aortic valve.  6. The aorta is normal unless otherwise noted.  7. There is redundancy of the interatrial septum.    ASSESSMENT AND PLAN  89 y.o. year old male here with:  Dx:  1. Mild cognitive impairment     PLAN:  MILD COGNITIVE IMPAIRMENT (MMSE 28/30; no major changes in ADLs related to memory; hearing loss, vision impairment, chronic pain, medications can contribute to cognitive change) - follow up hearing testing - try to stay active physically and get some exercise (at least 15-30 minutes per day) - eat a nutritious diet with lean protein, plants / vegetables, whole grains; avoid ultra-processed foods - increase social activities, brain stimulation, games, puzzles, hobbies, crafts, arts, music; try new activities; keep it fun! - aim for at least 7-8 hours  sleep per night (or more) - avoid smoking and alcohol - caution with medications, finances, driving - safety / supervision issues reviewed  ESSENTIAL TREMOR (since ~2014) - tried and failed primidone  POST-HERPETIC NEURALGIA - follow up with pain mgmt  Return for pending if symptoms worsen or fail to improve, return to PCP.    Suanne Marker, MD 09/20/2023, 11:55 AM Certified in Neurology, Neurophysiology and Neuroimaging  [facility] [address] [place], [place] [address] [phone]

## 2023-09-20 NOTE — Patient Instructions (Addendum)
  MILD COGNITIVE IMPAIRMENT (no major changes in ADLs related to memory; hearing loss, vision impairment, chronic pain, medications can contribute to cognitive change) - follow up hearing testing - try to stay active physically and get some exercise (at least 15-30 minutes per day) - eat a nutritious diet with lean protein, plants / vegetables, whole grains; avoid ultra-processed foods - increase social activities, brain stimulation, games, puzzles, hobbies, crafts, arts, music; try new activities; keep it fun! - aim for at least 7-8 hours sleep per night (or more) - avoid smoking and alcohol - caution with medications, finances, driving - safety / supervision issues reviewed  ESSENTIAL TREMOR (since ~2014) - tried and failed primidone, propranolol  POST-HERPETIC NEURALGIA - follow up with pain mgmt

## 2023-09-28 DIAGNOSIS — H6123 Impacted cerumen, bilateral: Secondary | ICD-10-CM | POA: Diagnosis not present

## 2023-09-29 NOTE — Progress Notes (Signed)
 Cardiology Office Note:  .   Date:  10/04/2023  ID:  Stephen Nunez, DOB June 03, 1932, MRN [id number] PCP: Stephen Floro, MD  [facility] Providers Cardiologist:  Stephen Red, MD    Patient Profile: .      PMH Chronic combined systolic and diastolic heart failure TTE 08/17/2023 EF 45-50, moderate LVH, G1DD, mild LAE Aortic valve disease Moderate aortic stenosis, mild to moderate AI on TTE 12/2022 Mild aortic stenosis with mean gradient 16.0 mmHg, moderate AI  Aortic root dilatation 40 mm on TTE 08/17/2023 Chronic LBBB Hypertension Hyperlipidemia DVT OSA Prostate cancer s/p prostatectomy in 2000 Post herpetic neuralgia  Previously seen by Dr. Tenny Nunez, he established with Dr. Cristal Nunez 02/18/2023.  At the previous visit 03/2022 he complained of LE swelling over a few months.  Had been on amlodipine for years without significant issues and labs revealed normal electrolytes, thyroid, and kidney function.  He was mildly anemic with hemoglobin of 11.2.  Admission 08/2022 with community-acquired pneumonia.  Troponin was elevated on admission felt to be secondary to demand ischemia.  On discharge, lisinopril was resumed at a lower dose 20 mg daily.  Aspirin 81 mg daily was discontinued.  At office visit 02/18/2023 with Dr. Cristal Nunez, BP was elevated at 175/67 and 154/72. Amlodipine had been d/c in the past due to leg swelling. He reported struggling with chronic pain for years due to shingles/postherpetic neuralgia localized to his left back and shoulder.  He follows with the pain clinic and typically takes to 325 mg Tylenol in the mornings.  Echo 12/2022 with LVEF 50 to 55%, mild LVH, G1 DD, septal dyssynchrony consistent with bundle branch block, mildly elevated PASP, trivial MR, mild to moderate AR, moderate aortic valve stenosis.  He was active with regular shopping and traveling.  Uses a stairstepper a couple of times per week. Repeat echo ordered in 6 mo with f/u  appointment following.        History of Present Illness: .   Stephen Nunez is a very pleasant 89 y.o. male who is here today with his significant other, Stephen Nunez.  Reports he is generally feeling well.  He walks with a cane and has chronic pain from neuralgia caused by shingles.  He remains active with activities around his home as well as using the stair climber most days. He denies chest pain, shortness of breath, orthopnea, PND, edema, presyncope, syncope.  Reports he is sleeping well, has to sleep on his right side due to pain on the left. We discussed potential testing and treatment options and he would like to proceed with further evaluation and potentially further invasive studies if warranted.   Discussed the use of AI scribe software for clinical note transcription with the patient, who gave verbal consent to proceed.   ROS: See HPI       Studies Reviewed: Marland Kitchen   EKG Interpretation Date/Time:  Monday October 04 2023 10:50:07 EDT Ventricular Rate:  69 PR Interval:  192 QRS Duration:  152 QT Interval:  446 QTC Calculation: 477 R Axis:   -33  Text Interpretation: Normal sinus rhythm Left axis deviation Left bundle branch block No acute changes Confirmed by Stephen Nunez [phone]) on 10/04/2023 11:18:19 AM     No results found for: "LIPOA"   Risk Assessment/Calculations:             Physical Exam:   VS:  BP 130/62   Pulse 65   Ht 5\' 11"  (1.803 m)   Wt 172  lb 3.2 oz (78.1 kg)   SpO2 99%   BMI 24.02 kg/m    Wt Readings from Last 3 Encounters:  10/04/23 172 lb 3.2 oz (78.1 kg)  09/20/23 174 lb (78.9 kg)  02/18/23 168 lb 1.6 oz (76.2 kg)    GEN: Elderly, well developed in no acute distress NECK: No JVD; No carotid bruits CARDIAC: RRR, 3/6 systolic murmur. No rubs, gallops RESPIRATORY:  Clear to auscultation without rales, wheezing or rhonchi  ABDOMEN: Soft, non-tender, non-distended EXTREMITIES:  No edema; No deformity     ASSESSMENT AND PLAN: .    Aortic valve disease:  Moderate calcification of the aortic valve with moderate AI and mild to moderate AAS, AVA by VTI measures 0.96 cm, mean gradient 16.0 mmHg.  Hypertension: BP is well-controlled.  We will recheck renal function today.  Continue antihypertensive therapy with lisinopril 20 mg daily.  Chronic combined CHF: TTE 08/17/23 with LVEF 45-50%, 40% by 3D volume, moderate LVH, G1 DD, mildly elevated RVSP.  He denies shortness of breath, orthopnea, PND, edema. Appears euvolemic on exam. Reports weight is stable at home. We discussed decreased LVEF in the setting of chronic LBBB and potential causes and treatment options. No known history of CAD. We will get coronary CTA for ischemia evaluation.  Will have him take Lopressor 50 mg 2 hours prior to test.  He is on lisinopril and is tolerating this well.  We will add Farxiga 10 mg daily.  Will get BMET today and again in 1 month.  LBBB: Chronic. Most recent echo 08/17/2023 reveals slightly decreased LVEF.  We are getting coronary CTA to evaluate for ischemia.  Hyperlipidemia: Lipid panel 02/23/2023 with total cholesterol 141, HDL 51, LDL 67, and triglycerides 098.  Continue rosuvastatin.  We will get updated lipid panel in 1 month when he returns for monitoring of kidney function on SGLT2i.        Disposition:2-3 months with me  Signed, Stephen Bridegroom, NP-C

## 2023-10-04 ENCOUNTER — Ambulatory Visit (HOSPITAL_BASED_OUTPATIENT_CLINIC_OR_DEPARTMENT_OTHER): Payer: PPO | Admitting: Nurse Practitioner

## 2023-10-04 ENCOUNTER — Other Ambulatory Visit (HOSPITAL_BASED_OUTPATIENT_CLINIC_OR_DEPARTMENT_OTHER): Payer: Self-pay | Admitting: Nurse Practitioner

## 2023-10-04 ENCOUNTER — Encounter (HOSPITAL_BASED_OUTPATIENT_CLINIC_OR_DEPARTMENT_OTHER): Payer: Self-pay | Admitting: Nurse Practitioner

## 2023-10-04 VITALS — BP 130/62 | HR 65 | Ht 71.0 in | Wt 172.2 lb

## 2023-10-04 DIAGNOSIS — I447 Left bundle-branch block, unspecified: Secondary | ICD-10-CM | POA: Diagnosis not present

## 2023-10-04 DIAGNOSIS — I5042 Chronic combined systolic (congestive) and diastolic (congestive) heart failure: Secondary | ICD-10-CM | POA: Diagnosis not present

## 2023-10-04 DIAGNOSIS — E7800 Pure hypercholesterolemia, unspecified: Secondary | ICD-10-CM

## 2023-10-04 DIAGNOSIS — I350 Nonrheumatic aortic (valve) stenosis: Secondary | ICD-10-CM

## 2023-10-04 DIAGNOSIS — I10 Essential (primary) hypertension: Secondary | ICD-10-CM

## 2023-10-04 MED ORDER — DAPAGLIFLOZIN PROPANEDIOL 10 MG PO TABS: 10.0000 mg | ORAL_TABLET | Freq: Every day | ORAL | 5 refills | Status: DC

## 2023-10-04 MED ORDER — METOPROLOL TARTRATE 50 MG PO TABS: ORAL_TABLET | ORAL | 0 refills | Status: AC

## 2023-10-04 NOTE — Patient Instructions (Addendum)
 Medication Instructions:  START FARXIGA 10 MG DAILY   TAKE METOPROLOL 50 MG 1 TABLET 2 HOURS PRIOR TO CT  *If you need a refill on your cardiac medications before your next appointment, please call your pharmacy*  Lab Work: BMET TODAY   FASTING LP/BMET IN 1 MONTH   If you have labs (blood work) drawn today and your tests are completely normal, you will receive your results only by: MyChart Message (if you have MyChart) OR A paper copy in the mail If you have any lab test that is abnormal or we need to change your treatment, we will call you to review the results.  Testing/Procedures: Your physician has requested that you have cardiac CT. Cardiac computed tomography (CT) is a painless test that uses an x-ray machine to take clear, detailed pictures of your heart. For further information please visit [email or web address] Please follow instruction sheet as given.  Follow-Up: At [facility], you and your health needs are our priority.  As part of our continuing mission to provide you with exceptional heart care, our providers are all part of one team.  This team includes your primary Cardiologist (physician) and Advanced Practice Providers or APPs (Physician Assistants and Nurse Practitioners) who all work together to provide you with the care you need, when you need it.  Your next appointment:   8 week(s)  Provider:   Eligha Bridegroom, NP    We recommend signing up for the patient portal called "MyChart".  Sign up information is provided on this After Visit Summary.  MyChart is used to connect with patients for Virtual Visits (Telemedicine).  Patients are able to view lab/test results, encounter notes, upcoming appointments, etc.  Non-urgent messages can be sent to your provider as well.   To learn more about what you can do with MyChart, go to [email or web address].   Other Instructions  Your cardiac CT will be scheduled at one of the below locations:   [facility] [address] [place], [place] [address] [phone]  OR  [facility] [address] [place], [place] [address] [phone]  OR   [facility] [address] [place], [place] [address] [phone]  OR   [facility] [address] [place], [place] [address] [phone]  OR   [facility] V[facility] [address]  [place], [place] [address] Opening October 25, 2023  If scheduled at [facility], please arrive at the [facility] and Children's Entrance (Entrance C2) of [facility] 30 minutes prior to test start time. You can use the FREE valet parking offered at entrance C (encouraged to control the heart rate for the test)  Proceed to the [facility] Radiology Department (first floor) to check-in and test prep.  All radiology patients and guests should use entrance C2 at [facility], accessed from [facility], even though the hospital's physical address listed is [address].    If scheduled at [facility] or [facility], please arrive 15 mins early for check-in and test prep.  There is spacious parking and easy access to the radiology department from the [facility] Heart and Vascular entrance. Please enter here and check-in with the desk attendant.   If scheduled at [facility], please arrive 30 minutes early for check-in and test prep.  Please follow these instructions carefully (unless otherwise directed):  An IV will  be required for this test and Nitroglycerin will be given.  Hold all erectile dysfunction medications at least 3 days (72 hrs) prior to test. (Ie viagra, cialis, sildenafil, tadalafil, etc)   On the Night Before the Test: Be sure to Drink plenty of water. Do not consume any caffeinated/decaffeinated beverages or chocolate 12 hours prior to  your test. Do not take any antihistamines 12 hours prior to your test.  On the Day of the Test: Drink plenty of water until 1 hour prior to the test. Do not eat any food 1 hour prior to test. You may take your regular medications prior to the test.  Take metoprolol (Lopressor) two hours prior to test. If you take Furosemide/Hydrochlorothiazide/Spironolactone/Chlorthalidone, please HOLD on the morning of the test. Patients who wear a continuous glucose monitor MUST remove the device prior to scanning.      After the Test: Drink plenty of water. After receiving IV contrast, you may experience a mild flushed feeling. This is normal. On occasion, you may experience a mild rash up to 24 hours after the test. This is not dangerous. If this occurs, you can take Benadryl 25 mg, Zyrtec, Claritin, or Allegra and increase your fluid intake. (Patients taking Tikosyn should avoid Benadryl, and may take Zyrtec, Claritin, or Allegra) If you experience trouble breathing, this can be serious. If it is severe call 911 IMMEDIATELY. If it is mild, please call our office.  We will call to schedule your test 2-4 weeks out understanding that some insurance companies will need an authorization prior to the service being performed.   For more information and frequently asked questions, please visit our website : [email or web address]  For non-scheduling related questions, please contact the cardiac imaging nurse navigator should you have any questions/concerns: Cardiac Imaging Nurse Navigators Direct Office Dial: [phone]   For scheduling needs, including cancellations and rescheduling, please call [place], [phone].  Cardiac CT Angiogram A cardiac CT angiogram is a procedure to look at the heart and the area around the heart. It may be done to help find the cause of chest pains or other symptoms of heart disease. During this procedure, a substance called contrast dye is injected into a vein  in the arm. The contrast highlights the blood vessels in the area to be checked. A large X-ray machine (CT scanner), then takes detailed pictures of the heart and the surrounding area. The procedure is also sometimes called a coronary CT angiogram, coronary artery scanning, or CTA. A cardiac CT angiogram allows the health care provider to see how well blood is flowing to and from the heart. The provider will be able to see if there are any problems, such as: Blockage or narrowing of the arteries in the heart. Fluid around the heart. Signs of weakness or disease in the muscles, valves, and tissues of the heart. Tell a health care provider about: Any allergies you have. This is especially important if you have had a previous allergic reaction to medicines, contrast dye, or iodine. All medicines you are taking, including vitamins, herbs, eye drops, creams, and over-the-counter medicines. Any bleeding problems you have. Any surgeries you have had. Any medical conditions you have, including kidney problems or kidney failure. Whether you are pregnant or may be pregnant. Any anxiety disorders, chronic pain, or other conditions you have. These may increase your stress or prevent you from lying still. Any history of abnormal heart rhythms or heart procedures. What are the risks? Your provider will talk with you about risks.  These may include: Bleeding. Infection. Allergic reactions to medicines or dyes. Damage to other structures or organs. Kidney damage from the contrast dye. Increased risk of cancer from radiation exposure. This risk is low. Talk with your provider about: The risks and benefits of testing. How you can receive the lowest dose of radiation. What happens before the procedure? Wear comfortable clothing and remove any jewelry, glasses, dentures, and hearing aids. Follow instructions from your provider about eating and drinking. These may include: 12 hours before the procedure Avoid  caffeine. This includes tea, coffee, soda, energy drinks, and diet pills. Drink plenty of water or other fluids that do not have caffeine in them. Being well hydrated can prevent complications. 4-6 hours before the procedure Stop eating and drinking. This will reduce the risk of nausea from the contrast dye. Ask your provider about changing or stopping your regular medicines. These include: Diabetes medicines. Medicines to treat problems with erections (erectile dysfunction). If you have kidney problems, you may need to receive IV hydration before and after the test. What happens during the procedure?  Hair on your chest may need to be removed so that small sticky patches called electrodes can be placed on your chest. These will transmit information that helps to monitor your heart during the procedure. An IV will be inserted into one of your veins. You might be given a medicine to control your heart rate during the procedure. This will help to ensure that good images are obtained. You will be asked to lie on an exam table. This table will slide in and out of the CT machine during the procedure. Contrast dye will be injected into the IV. You might feel warm, or you may get a metallic taste in your mouth. You may be given medicines to relax or dilate the arteries in your heart. If you are allergic to contrast dyes or iodine you may be given medicine before the test to reduce the risk of an allergic reaction. The table that you are lying on will move into the CT machine tunnel for the scan. The person running the machine will give you instructions while the scans are being done. You may be asked to: Keep your arms above your head. Hold your breath for short periods. Stay very still, even if the table is moving. The procedure may vary among providers and hospitals. What can I expect after the procedure? After your procedure, it is common to have: A metallic taste in your mouth from the contrast  dye. A feeling of warmth. A headache from the heart medicine. Follow these instructions at home: Take over-the-counter and prescription medicines only as told by your provider. If you are told, drink enough fluid to keep your pee pale yellow. This will help to flush the contrast dye out of your body. Most people can return to their normal activities right after the procedure. Ask your provider what activities are safe for you. It is up to you to get the results of your procedure. Ask your provider, or the department that is doing the procedure, when your results will be ready. Contact a health care provider if: You have any symptoms of allergy to the contrast dye. These include: Shortness of breath. Rash or hives. A racing heartbeat. You notice a change in your peeing (urination). This information is not intended to replace advice given to you by your health care provider. Make sure you discuss any questions you have with your health care provider. Document Revised: 01/16/2022  Document Reviewed: 01/16/2022 Elsevier Patient Education  2024 [employer].

## 2023-10-05 ENCOUNTER — Encounter (HOSPITAL_BASED_OUTPATIENT_CLINIC_OR_DEPARTMENT_OTHER): Payer: Self-pay

## 2023-10-05 LAB — BASIC METABOLIC PANEL WITH GFR
BUN/Creatinine Ratio: 27 — ABNORMAL HIGH (ref 10–24)
BUN: 39 mg/dL — ABNORMAL HIGH (ref 8–27)
CO2: 24 mmol/L (ref 20–29)
Calcium: 9.7 mg/dL (ref 8.6–10.2)
Chloride: 106 mmol/L (ref 96–106)
Creatinine, Ser: 1.44 mg/dL — ABNORMAL HIGH (ref 0.76–1.27)
Glucose: 91 mg/dL (ref 70–99)
Potassium: 4.9 mmol/L (ref 3.5–5.2)
Sodium: 145 mmol/L — ABNORMAL HIGH (ref 134–144)
eGFR: 46 mL/min/{1.73_m2} — ABNORMAL LOW (ref >59)

## 2023-10-11 DIAGNOSIS — H903 Sensorineural hearing loss, bilateral: Secondary | ICD-10-CM | POA: Diagnosis not present

## 2023-10-14 ENCOUNTER — Encounter (HOSPITAL_COMMUNITY): Payer: Self-pay

## 2023-10-15 ENCOUNTER — Telehealth (HOSPITAL_COMMUNITY): Payer: Self-pay | Admitting: *Deleted

## 2023-10-15 NOTE — Telephone Encounter (Signed)
 Reaching out to patient to offer assistance regarding upcoming cardiac imaging study; spoke to wife (ok per DPR) verbalizes understanding of appt date/time, parking situation and where to check in, pre-test NPO status and medications ordered, and verified current allergies; name and call back number provided for further questions should they arise Kerri Peed RN Navigator Cardiac Imaging [facility] [phone] office [phone] cell

## 2023-10-18 ENCOUNTER — Ambulatory Visit (HOSPITAL_COMMUNITY)
Admission: RE | Admit: 2023-10-18 | Discharge: 2023-10-18 | Disposition: A | Discharge status: Home / Self Care | Source: Ambulatory Visit | Attending: Nurse Practitioner | Admitting: Nurse Practitioner

## 2023-10-18 DIAGNOSIS — I350 Nonrheumatic aortic (valve) stenosis: Secondary | ICD-10-CM | POA: Diagnosis not present

## 2023-10-18 DIAGNOSIS — I447 Left bundle-branch block, unspecified: Secondary | ICD-10-CM | POA: Insufficient documentation

## 2023-10-18 DIAGNOSIS — I10 Essential (primary) hypertension: Secondary | ICD-10-CM | POA: Diagnosis not present

## 2023-10-18 MED ORDER — IOHEXOL 350 MG/ML SOLN
95.0000 mL | Freq: Once | INTRAVENOUS | Status: AC | PRN
  Administered 2023-10-18: 95 mL via INTRAVENOUS

## 2023-10-18 MED ORDER — NITROGLYCERIN 0.4 MG SL SUBL
0.8000 mg | SUBLINGUAL_TABLET | Freq: Once | SUBLINGUAL | Status: AC
  Administered 2023-10-18: 0.8 mg via SUBLINGUAL

## 2023-10-18 MED ORDER — NITROGLYCERIN 0.4 MG SL SUBL
SUBLINGUAL_TABLET | SUBLINGUAL | Status: AC
  Filled 2023-10-18: qty 2

## 2023-10-18 MED ORDER — METOPROLOL TARTRATE 5 MG/5ML IV SOLN: 10.0000 mg | Freq: Once | INTRAVENOUS | Status: DC | PRN

## 2023-10-18 MED ORDER — DILTIAZEM HCL 25 MG/5ML IV SOLN: 10.0000 mg | INTRAVENOUS | Status: DC | PRN

## 2023-10-20 ENCOUNTER — Encounter (HOSPITAL_BASED_OUTPATIENT_CLINIC_OR_DEPARTMENT_OTHER): Payer: Self-pay

## 2023-10-22 DIAGNOSIS — B0229 Other postherpetic nervous system involvement: Secondary | ICD-10-CM | POA: Diagnosis not present

## 2023-10-22 DIAGNOSIS — Z5181 Encounter for therapeutic drug level monitoring: Secondary | ICD-10-CM | POA: Diagnosis not present

## 2023-11-08 DIAGNOSIS — I10 Essential (primary) hypertension: Secondary | ICD-10-CM | POA: Diagnosis not present

## 2023-11-08 DIAGNOSIS — E7800 Pure hypercholesterolemia, unspecified: Secondary | ICD-10-CM | POA: Diagnosis not present

## 2023-11-09 ENCOUNTER — Ambulatory Visit (HOSPITAL_BASED_OUTPATIENT_CLINIC_OR_DEPARTMENT_OTHER): Payer: Self-pay | Admitting: Nurse Practitioner

## 2023-11-09 LAB — LIPID PANEL
Chol/HDL Ratio: 2.8 ratio (ref 0.0–5.0)
Cholesterol, Total: 136 mg/dL (ref 100–199)
HDL: 49 mg/dL (ref >39)
LDL Chol Calc (NIH): 66 mg/dL (ref 0–99)
Triglycerides: 115 mg/dL (ref 0–149)
VLDL Cholesterol Cal: 21 mg/dL (ref 5–40)

## 2023-11-09 LAB — BASIC METABOLIC PANEL WITH GFR
BUN/Creatinine Ratio: 25 — ABNORMAL HIGH (ref 10–24)
BUN: 41 mg/dL — ABNORMAL HIGH (ref 8–27)
CO2: 22 mmol/L (ref 20–29)
Calcium: 9.7 mg/dL (ref 8.6–10.2)
Chloride: 105 mmol/L (ref 96–106)
Creatinine, Ser: 1.67 mg/dL — ABNORMAL HIGH (ref 0.76–1.27)
Glucose: 99 mg/dL (ref 70–99)
Potassium: 4.4 mmol/L (ref 3.5–5.2)
Sodium: 143 mmol/L (ref 134–144)
eGFR: 39 mL/min/{1.73_m2} — ABNORMAL LOW (ref >59)

## 2023-11-30 ENCOUNTER — Encounter (HOSPITAL_BASED_OUTPATIENT_CLINIC_OR_DEPARTMENT_OTHER): Payer: Self-pay | Admitting: Nurse Practitioner

## 2023-11-30 ENCOUNTER — Ambulatory Visit (HOSPITAL_BASED_OUTPATIENT_CLINIC_OR_DEPARTMENT_OTHER): Admitting: Nurse Practitioner

## 2023-11-30 VITALS — BP 138/68 | HR 61 | Ht 71.0 in | Wt 167.0 lb

## 2023-11-30 DIAGNOSIS — I350 Nonrheumatic aortic (valve) stenosis: Secondary | ICD-10-CM

## 2023-11-30 DIAGNOSIS — I447 Left bundle-branch block, unspecified: Secondary | ICD-10-CM

## 2023-11-30 DIAGNOSIS — I351 Nonrheumatic aortic (valve) insufficiency: Secondary | ICD-10-CM | POA: Diagnosis not present

## 2023-11-30 DIAGNOSIS — I5042 Chronic combined systolic (congestive) and diastolic (congestive) heart failure: Secondary | ICD-10-CM | POA: Diagnosis not present

## 2023-11-30 DIAGNOSIS — G8929 Other chronic pain: Secondary | ICD-10-CM

## 2023-11-30 DIAGNOSIS — M549 Dorsalgia, unspecified: Secondary | ICD-10-CM

## 2023-11-30 DIAGNOSIS — I10 Essential (primary) hypertension: Secondary | ICD-10-CM

## 2023-11-30 DIAGNOSIS — E785 Hyperlipidemia, unspecified: Secondary | ICD-10-CM | POA: Diagnosis not present

## 2023-11-30 DIAGNOSIS — I2510 Atherosclerotic heart disease of native coronary artery without angina pectoris: Secondary | ICD-10-CM | POA: Diagnosis not present

## 2023-11-30 NOTE — Patient Instructions (Signed)
 Medication Instructions:   Your physician recommends that you continue on your current medications as directed. Please refer to the Current Medication list given to you today.   *If you need a refill on your cardiac medications before your next appointment, please call your pharmacy*  Lab Work:  TODAY!!!!! BMET  If you have labs (blood work) drawn today and your tests are completely normal, you will receive your results only by: MyChart Message (if you have MyChart) OR A paper copy in the mail If you have any lab test that is abnormal or we need to change your treatment, we will call you to review the results.  Testing/Procedures:  Your physician has requested that you have an echocardiogram. Echocardiography is a painless test that uses sound waves to create images of your heart. It provides your doctor with information about the size and shape of your heart and how well your heart's chambers and valves are working. This procedure takes approximately one hour. There are no restrictions for this procedure. Please do NOT wear cologne, aftershave, or lotions (deodorant is allowed). Please arrive 15 minutes prior to your appointment time.  Follow-Up: At [facility], you and your health needs are our priority.  As part of our continuing mission to provide you with exceptional heart care, our providers are all part of one team.  This team includes your primary Cardiologist (physician) and Advanced Practice Providers or APPs (Physician Assistants and Nurse Practitioners) who all work together to provide you with the care you need, when you need it.  Your next appointment:   9 month(s)  Provider:   Sheryle Donning, MD    We recommend signing up for the patient portal called "MyChart".  Sign up information is provided on this After Visit Summary.  MyChart is used to connect with patients for Virtual Visits (Telemedicine).  Patients are able to view lab/test results, encounter  notes, upcoming appointments, etc.  Non-urgent messages can be sent to your provider as well.   To learn more about what you can do with MyChart, go to [email or web address].   Other Instructions  Your physician wants you to follow-up in: 9 months.  You will receive a reminder letter in the mail two months in advance. If you don't receive a letter, please call our office to schedule the follow-up appointment.

## 2023-11-30 NOTE — Progress Notes (Signed)
 Cardiology Office Note:  .   Date:  11/30/2023  ID:  Stephen Nunez, DOB [birth year], MRN [id number] PCP: Stephen Mould, MD  [facility] Providers Cardiologist:  Stephen Donning, MD    Patient Profile: .      PMH Chronic combined systolic and diastolic heart failure TTE 08/17/2023 EF 45-50, moderate LVH, G1DD, mild LAE Aortic valve disease Moderate aortic stenosis, mild to moderate AI on TTE 12/2022 Mild aortic stenosis with mean gradient 16.0 mmHg, moderate AI  Aortic root dilatation 40 mm on TTE 08/17/2023 CAD Mild nonobstructive CAD on coronary CTA 10/18/2023 Chronic LBBB Hypertension Hyperlipidemia DVT OSA Prostate cancer s/p prostatectomy in 2000 Post herpetic neuralgia  Previously seen by Dr. Avanell Nunez, he established with Dr. Veryl Nunez 02/18/2023.  At the previous visit 03/2022 he complained of LE swelling over a few months.  Had been on amlodipine  for years without significant issues and labs revealed normal electrolytes, thyroid , and kidney function.  He was mildly anemic with hemoglobin of 11.2.  Admission 08/2022 with community-acquired pneumonia.  Troponin was elevated on admission felt to be secondary to demand ischemia.  On discharge, lisinopril  was resumed at a lower dose 20 mg daily.  Aspirin  81 mg daily was discontinued.  At office visit 02/18/2023 with Dr. Veryl Nunez, BP was elevated at 175/67 and 154/72. Amlodipine  had been d/c in the past due to leg swelling. He reported struggling with chronic pain for years due to shingles/postherpetic neuralgia localized to his left back and shoulder.  He follows with the pain clinic and typically takes to 325 mg Tylenol  in the mornings.  Echo 12/2022 with LVEF 50 to 55%, mild LVH, G1 DD, septal dyssynchrony consistent with bundle branch block, mildly elevated PASP, trivial MR, mild to moderate AR, moderate aortic valve stenosis.  He was active with regular shopping and traveling.  Uses a stairstepper a couple of times  per week. Repeat echo ordered in 6 mo with f/u appointment following.   Seen by me on 10/04/23 with his significant other, Stephen Nunez. Feeling well.  He walks with a cane and has chronic pain from neuralgia caused by shingles. Active with activities around his home as well as using the stair climber most days. He denied chest pain, shortness of breath, orthopnea, PND, edema, presyncope, syncope. Reports that he is sleeping well, has to sleep on his right side due to pain on the left. We discussed potential testing and treatment options and he would like to proceed with further evaluation and potentially further invasive studies if warranted.  Farxiga  10 mg daily was added for management of heart failure.  SCr increased from 1.44-1.6 4:07 weeks of therapy.  He admitted to poor hydration. Coronary CTA was obtained to rule out ischemia and revealed mild nonobstructive CAD with CAC score of 195.        History of Present Illness: .    History of Present Illness Stephen Nunez "Stephen Nunez" is a very pleasant 89 year old male with coronary artery disease who presents for a cardiovascular follow-up. He reports he is feeling well and continues his activities of using the stair climber, light weight lifting, and activities around the home and shopping. He denies concerning effects since starting Farxiga .  He denies chest pain, shortness of breath, orthopnea, PND, edema, presyncope, syncope.  His significant other reports concern at times that he is forgetting things more often, however he continues to read topics that are fascinating to him and keep his mind active. He reports dependence on  Stephen Nunez for appointments because she keeps their calendar.  He was recently given oxycodone  for significant back pain.  He tries to limit use is much as possible and also uses Tylenol  for less severe pain.   Discussed the use of AI scribe software for clinical note transcription with the patient, who gave verbal consent to proceed.   ROS: See  HPI       Studies Reviewed: Aaron Aas         No results found for: "LIPOA"   Risk Assessment/Calculations:             Physical Exam:   VS:  BP 138/68 (BP Location: Right Arm, Patient Position: Sitting, Cuff Size: Normal)   Pulse 61   Ht 5\' 11"  (1.803 m)   Wt 167 lb (75.8 kg)   SpO2 96%   BMI 23.29 kg/m    Wt Readings from Last 3 Encounters:  11/30/23 167 lb (75.8 kg)  10/04/23 172 lb 3.2 oz (78.1 kg)  09/20/23 174 lb (78.9 kg)    GEN: Elderly, well developed in no acute distress NECK: No JVD; No carotid bruits CARDIAC: RRR, 3/6 systolic murmur. No rubs, gallops RESPIRATORY:  Clear to auscultation without rales, wheezing or rhonchi  ABDOMEN: Soft, non-tender, non-distended EXTREMITIES:  No edema; No deformity     ASSESSMENT AND PLAN: .    CAD: Mild nonobstructive CAD on coronary CTA 10/18/23. He denies chest pain, dyspnea, or other symptoms concerning for angina.  No indication for further ischemic evaluation at this time.  Has been on aspirin  for several years. No bleeding concerns.   Aortic valve disease: Moderate calcification of the aortic valve with moderate AI and mild to moderate AS, AVA by VTI measures 0.96 cm, mean gradient 16.0 mmHg on TTE 08/17/23. He is asymptomatic today. We will repeat echo 07/2024 for surveillance.   Hypertension: BP is well-controlled. Renal function has been stable, we will recheck today. No changes in anti-hypertensive therapy today.   Chronic combined CHF: He remains active with no significant symptoms. TTE 08/17/23 with LVEF 45-50%, 40% by 3D volume, moderate LVH, G1 DD, mildly elevated RVSP.  He denies shortness of breath, orthopnea, PND, edema. Appears euvolemic on exam. Weight is stable at home. Farxiga  10 mg daily was added at office visit 10/04/23. Mildly increased creatinine level one month later. We will recheck today.  Plan to continue lisinopril  and Farxiga  unless there is significant kidney injury on labs today.  LBBB: Chronic. Most recent  echo 08/17/2023 reveals slightly decreased LVEF.  No significant CAD on coronary CTA.   Hyperlipidemia LDL goal < 70: Lipid panel 11/08/2023 with total cholesterol 136, triglycerides 115, HDL 49, and LDL-C 66.  Continue rosuvastatin .   Chronic back pain: Managed with oxycodone  5 mg as needed. Risks include falls, disorientation, and dependency. Advised to use the lowest effective dose and consider Tylenol  as a safer alternative. Continue oxycodone  5 mg as needed. Management per pain clinic.        Disposition: 9 months with Dr. Veryl Nunez with echo prior  Signed, Slater Duncan, NP-C

## 2023-12-01 ENCOUNTER — Ambulatory Visit (HOSPITAL_BASED_OUTPATIENT_CLINIC_OR_DEPARTMENT_OTHER): Payer: Self-pay | Admitting: Nurse Practitioner

## 2023-12-01 ENCOUNTER — Other Ambulatory Visit (HOSPITAL_BASED_OUTPATIENT_CLINIC_OR_DEPARTMENT_OTHER): Payer: Self-pay | Admitting: *Deleted

## 2023-12-01 DIAGNOSIS — R7989 Other specified abnormal findings of blood chemistry: Secondary | ICD-10-CM

## 2023-12-01 LAB — BASIC METABOLIC PANEL WITH GFR
BUN/Creatinine Ratio: 24 (ref 10–24)
BUN: 44 mg/dL — ABNORMAL HIGH (ref 8–27)
CO2: 23 mmol/L (ref 20–29)
Calcium: 9.4 mg/dL (ref 8.6–10.2)
Chloride: 106 mmol/L (ref 96–106)
Creatinine, Ser: 1.87 mg/dL — ABNORMAL HIGH (ref 0.76–1.27)
Glucose: 97 mg/dL (ref 70–99)
Potassium: 5 mmol/L (ref 3.5–5.2)
Sodium: 144 mmol/L (ref 134–144)
eGFR: 34 mL/min/{1.73_m2} — ABNORMAL LOW (ref >59)

## 2023-12-02 DIAGNOSIS — B0229 Other postherpetic nervous system involvement: Secondary | ICD-10-CM | POA: Diagnosis not present

## 2023-12-02 DIAGNOSIS — Z5181 Encounter for therapeutic drug level monitoring: Secondary | ICD-10-CM | POA: Diagnosis not present

## 2023-12-03 DIAGNOSIS — R197 Diarrhea, unspecified: Secondary | ICD-10-CM | POA: Diagnosis not present

## 2023-12-03 DIAGNOSIS — Z860100 Personal history of colon polyps, unspecified: Secondary | ICD-10-CM | POA: Diagnosis not present

## 2023-12-06 DIAGNOSIS — H401232 Low-tension glaucoma, bilateral, moderate stage: Secondary | ICD-10-CM | POA: Diagnosis not present

## 2023-12-06 DIAGNOSIS — H524 Presbyopia: Secondary | ICD-10-CM | POA: Diagnosis not present

## 2023-12-06 DIAGNOSIS — H5202 Hypermetropia, left eye: Secondary | ICD-10-CM | POA: Diagnosis not present

## 2024-02-29 DIAGNOSIS — Z79899 Other long term (current) drug therapy: Secondary | ICD-10-CM | POA: Diagnosis not present

## 2024-02-29 DIAGNOSIS — B0229 Other postherpetic nervous system involvement: Secondary | ICD-10-CM | POA: Diagnosis not present

## 2024-03-06 DIAGNOSIS — Z6826 Body mass index (BMI) 26.0-26.9, adult: Secondary | ICD-10-CM | POA: Diagnosis not present

## 2024-03-06 DIAGNOSIS — B0229 Other postherpetic nervous system involvement: Secondary | ICD-10-CM | POA: Diagnosis not present

## 2024-03-06 DIAGNOSIS — Z8546 Personal history of malignant neoplasm of prostate: Secondary | ICD-10-CM | POA: Diagnosis not present

## 2024-03-06 DIAGNOSIS — Z0000 Encounter for general adult medical examination without abnormal findings: Secondary | ICD-10-CM | POA: Diagnosis not present

## 2024-03-06 DIAGNOSIS — D649 Anemia, unspecified: Secondary | ICD-10-CM | POA: Diagnosis not present

## 2024-03-06 DIAGNOSIS — E7800 Pure hypercholesterolemia, unspecified: Secondary | ICD-10-CM | POA: Diagnosis not present

## 2024-03-06 DIAGNOSIS — I10 Essential (primary) hypertension: Secondary | ICD-10-CM | POA: Diagnosis not present

## 2024-03-06 DIAGNOSIS — Z23 Encounter for immunization: Secondary | ICD-10-CM | POA: Diagnosis not present

## 2024-03-15 DIAGNOSIS — H401233 Low-tension glaucoma, bilateral, severe stage: Secondary | ICD-10-CM | POA: Diagnosis not present

## 2024-03-15 DIAGNOSIS — H4720 Unspecified optic atrophy: Secondary | ICD-10-CM | POA: Diagnosis not present

## 2024-03-22 DIAGNOSIS — Z23 Encounter for immunization: Secondary | ICD-10-CM | POA: Diagnosis not present

## 2024-04-04 DIAGNOSIS — N1831 Chronic kidney disease, stage 3a: Secondary | ICD-10-CM | POA: Diagnosis not present

## 2024-04-13 DIAGNOSIS — R5381 Other malaise: Secondary | ICD-10-CM | POA: Diagnosis not present

## 2024-04-13 DIAGNOSIS — M79671 Pain in right foot: Secondary | ICD-10-CM | POA: Diagnosis not present

## 2024-04-13 DIAGNOSIS — W19XXXA Unspecified fall, initial encounter: Secondary | ICD-10-CM | POA: Diagnosis not present

## 2024-04-13 DIAGNOSIS — M79604 Pain in right leg: Secondary | ICD-10-CM | POA: Diagnosis not present

## 2024-04-24 DIAGNOSIS — R5381 Other malaise: Secondary | ICD-10-CM | POA: Diagnosis not present

## 2024-04-24 DIAGNOSIS — M79604 Pain in right leg: Secondary | ICD-10-CM | POA: Diagnosis not present

## 2024-04-24 DIAGNOSIS — R2681 Unsteadiness on feet: Secondary | ICD-10-CM | POA: Diagnosis not present

## 2024-04-24 DIAGNOSIS — M6281 Muscle weakness (generalized): Secondary | ICD-10-CM | POA: Diagnosis not present

## 2024-04-28 DIAGNOSIS — Z23 Encounter for immunization: Secondary | ICD-10-CM | POA: Diagnosis not present

## 2024-05-01 DIAGNOSIS — M6281 Muscle weakness (generalized): Secondary | ICD-10-CM | POA: Diagnosis not present

## 2024-05-01 DIAGNOSIS — R2681 Unsteadiness on feet: Secondary | ICD-10-CM | POA: Diagnosis not present

## 2024-05-01 DIAGNOSIS — R5381 Other malaise: Secondary | ICD-10-CM | POA: Diagnosis not present

## 2024-05-01 DIAGNOSIS — M79604 Pain in right leg: Secondary | ICD-10-CM | POA: Diagnosis not present

## 2024-05-04 DIAGNOSIS — R2681 Unsteadiness on feet: Secondary | ICD-10-CM | POA: Diagnosis not present

## 2024-05-04 DIAGNOSIS — R5381 Other malaise: Secondary | ICD-10-CM | POA: Diagnosis not present

## 2024-05-04 DIAGNOSIS — M79604 Pain in right leg: Secondary | ICD-10-CM | POA: Diagnosis not present

## 2024-05-04 DIAGNOSIS — M6281 Muscle weakness (generalized): Secondary | ICD-10-CM | POA: Diagnosis not present

## 2024-05-08 DIAGNOSIS — M79604 Pain in right leg: Secondary | ICD-10-CM | POA: Diagnosis not present

## 2024-05-08 DIAGNOSIS — M6281 Muscle weakness (generalized): Secondary | ICD-10-CM | POA: Diagnosis not present

## 2024-05-08 DIAGNOSIS — R5381 Other malaise: Secondary | ICD-10-CM | POA: Diagnosis not present

## 2024-05-08 DIAGNOSIS — R2681 Unsteadiness on feet: Secondary | ICD-10-CM | POA: Diagnosis not present

## 2024-05-10 DIAGNOSIS — M79604 Pain in right leg: Secondary | ICD-10-CM | POA: Diagnosis not present

## 2024-05-10 DIAGNOSIS — R2681 Unsteadiness on feet: Secondary | ICD-10-CM | POA: Diagnosis not present

## 2024-05-10 DIAGNOSIS — R5381 Other malaise: Secondary | ICD-10-CM | POA: Diagnosis not present

## 2024-05-10 DIAGNOSIS — M6281 Muscle weakness (generalized): Secondary | ICD-10-CM | POA: Diagnosis not present

## 2024-05-22 DIAGNOSIS — M79604 Pain in right leg: Secondary | ICD-10-CM | POA: Diagnosis not present

## 2024-05-22 DIAGNOSIS — R2681 Unsteadiness on feet: Secondary | ICD-10-CM | POA: Diagnosis not present

## 2024-05-22 DIAGNOSIS — M6281 Muscle weakness (generalized): Secondary | ICD-10-CM | POA: Diagnosis not present

## 2024-05-22 DIAGNOSIS — R5381 Other malaise: Secondary | ICD-10-CM | POA: Diagnosis not present

## 2024-05-24 DIAGNOSIS — R2681 Unsteadiness on feet: Secondary | ICD-10-CM | POA: Diagnosis not present

## 2024-05-24 DIAGNOSIS — R5381 Other malaise: Secondary | ICD-10-CM | POA: Diagnosis not present

## 2024-05-24 DIAGNOSIS — M6281 Muscle weakness (generalized): Secondary | ICD-10-CM | POA: Diagnosis not present

## 2024-05-29 DIAGNOSIS — L710 Perioral dermatitis: Secondary | ICD-10-CM | POA: Diagnosis not present

## 2024-05-29 DIAGNOSIS — L821 Other seborrheic keratosis: Secondary | ICD-10-CM | POA: Diagnosis not present

## 2024-05-29 DIAGNOSIS — L570 Actinic keratosis: Secondary | ICD-10-CM | POA: Diagnosis not present

## 2024-05-29 DIAGNOSIS — L218 Other seborrheic dermatitis: Secondary | ICD-10-CM | POA: Diagnosis not present

## 2024-05-29 DIAGNOSIS — L853 Xerosis cutis: Secondary | ICD-10-CM | POA: Diagnosis not present

## 2024-05-29 DIAGNOSIS — D692 Other nonthrombocytopenic purpura: Secondary | ICD-10-CM | POA: Diagnosis not present

## 2024-05-29 DIAGNOSIS — L814 Other melanin hyperpigmentation: Secondary | ICD-10-CM | POA: Diagnosis not present

## 2024-05-31 DIAGNOSIS — Z5181 Encounter for therapeutic drug level monitoring: Secondary | ICD-10-CM | POA: Diagnosis not present

## 2024-05-31 DIAGNOSIS — B0229 Other postherpetic nervous system involvement: Secondary | ICD-10-CM | POA: Diagnosis not present

## 2024-06-07 DIAGNOSIS — R5381 Other malaise: Secondary | ICD-10-CM | POA: Diagnosis not present

## 2024-06-07 DIAGNOSIS — R2681 Unsteadiness on feet: Secondary | ICD-10-CM | POA: Diagnosis not present

## 2024-06-07 DIAGNOSIS — M6281 Muscle weakness (generalized): Secondary | ICD-10-CM | POA: Diagnosis not present

## 2024-06-09 DIAGNOSIS — M79604 Pain in right leg: Secondary | ICD-10-CM | POA: Diagnosis not present

## 2024-06-09 DIAGNOSIS — M6281 Muscle weakness (generalized): Secondary | ICD-10-CM | POA: Diagnosis not present

## 2024-06-09 DIAGNOSIS — R2681 Unsteadiness on feet: Secondary | ICD-10-CM | POA: Diagnosis not present

## 2024-06-09 DIAGNOSIS — R5381 Other malaise: Secondary | ICD-10-CM | POA: Diagnosis not present

## 2024-08-01 ENCOUNTER — Other Ambulatory Visit (HOSPITAL_BASED_OUTPATIENT_CLINIC_OR_DEPARTMENT_OTHER)

## 2024-09-11 ENCOUNTER — Other Ambulatory Visit (HOSPITAL_BASED_OUTPATIENT_CLINIC_OR_DEPARTMENT_OTHER)

## 2024-10-31 ENCOUNTER — Ambulatory Visit (HOSPITAL_BASED_OUTPATIENT_CLINIC_OR_DEPARTMENT_OTHER): Admitting: Cardiology
